# Patient Record
Sex: Female | Born: 1950
Health system: Southern US, Community
[De-identification: ages and names within clinical notes are randomized; demographics above are authoritative.]

## PROBLEM LIST (undated history)

## (undated) DIAGNOSIS — K2100 Gastro-esophageal reflux disease with esophagitis, without bleeding: Secondary | ICD-10-CM

## (undated) DIAGNOSIS — R569 Unspecified convulsions: Secondary | ICD-10-CM

## (undated) DIAGNOSIS — K222 Esophageal obstruction: Secondary | ICD-10-CM

## (undated) DIAGNOSIS — T7840XA Allergy, unspecified, initial encounter: Secondary | ICD-10-CM

## (undated) DIAGNOSIS — K449 Diaphragmatic hernia without obstruction or gangrene: Secondary | ICD-10-CM

## (undated) DIAGNOSIS — C3490 Malignant neoplasm of unspecified part of unspecified bronchus or lung: Secondary | ICD-10-CM

## (undated) DIAGNOSIS — R7989 Other specified abnormal findings of blood chemistry: Secondary | ICD-10-CM

## (undated) DIAGNOSIS — E559 Vitamin D deficiency, unspecified: Secondary | ICD-10-CM

## (undated) DIAGNOSIS — C649 Malignant neoplasm of unspecified kidney, except renal pelvis: Secondary | ICD-10-CM

## (undated) DIAGNOSIS — R51 Headache: Secondary | ICD-10-CM

## (undated) DIAGNOSIS — M545 Low back pain, unspecified: Secondary | ICD-10-CM

## (undated) DIAGNOSIS — C801 Malignant (primary) neoplasm, unspecified: Secondary | ICD-10-CM

## (undated) DIAGNOSIS — G8929 Other chronic pain: Secondary | ICD-10-CM

## (undated) DIAGNOSIS — K219 Gastro-esophageal reflux disease without esophagitis: Secondary | ICD-10-CM

## (undated) DIAGNOSIS — K648 Other hemorrhoids: Secondary | ICD-10-CM

## (undated) DIAGNOSIS — D126 Benign neoplasm of colon, unspecified: Secondary | ICD-10-CM

## (undated) DIAGNOSIS — N63 Unspecified lump in unspecified breast: Secondary | ICD-10-CM

## (undated) DIAGNOSIS — K589 Irritable bowel syndrome without diarrhea: Secondary | ICD-10-CM

## (undated) DIAGNOSIS — R519 Headache, unspecified: Secondary | ICD-10-CM

## (undated) DIAGNOSIS — M1990 Unspecified osteoarthritis, unspecified site: Secondary | ICD-10-CM

## (undated) DIAGNOSIS — R609 Edema, unspecified: Secondary | ICD-10-CM

## (undated) DIAGNOSIS — N959 Unspecified menopausal and perimenopausal disorder: Secondary | ICD-10-CM

## (undated) DIAGNOSIS — E785 Hyperlipidemia, unspecified: Secondary | ICD-10-CM

## (undated) DIAGNOSIS — Z5189 Encounter for other specified aftercare: Secondary | ICD-10-CM

## (undated) DIAGNOSIS — I10 Essential (primary) hypertension: Secondary | ICD-10-CM

## (undated) HISTORY — DX: Gastro-esophageal reflux disease with esophagitis: K21.0

## (undated) HISTORY — DX: Headache: R51

## (undated) HISTORY — PX: BREAST SURGERY: SHX581

## (undated) HISTORY — DX: Allergy, unspecified, initial encounter: T78.40XA

## (undated) HISTORY — DX: Irritable bowel syndrome, unspecified: K58.9

## (undated) HISTORY — PX: KIDNEY SURGERY: SHX687

## (undated) HISTORY — DX: Esophageal obstruction: K22.2

## (undated) HISTORY — DX: Encounter for other specified aftercare: Z51.89

## (undated) HISTORY — DX: Gastro-esophageal reflux disease with esophagitis, without bleeding: K21.00

## (undated) HISTORY — PX: LUNG SURGERY: SHX703

## (undated) HISTORY — DX: Low back pain, unspecified: M54.50

## (undated) HISTORY — DX: Benign neoplasm of colon, unspecified: D12.6

## (undated) HISTORY — DX: Malignant neoplasm of unspecified part of unspecified bronchus or lung: C34.90

## (undated) HISTORY — DX: Unspecified osteoarthritis, unspecified site: M19.90

## (undated) HISTORY — DX: Headache, unspecified: R51.9

## (undated) HISTORY — DX: Essential (primary) hypertension: I10

## (undated) HISTORY — DX: Low back pain: M54.5

## (undated) HISTORY — PX: NASAL SINUS SURGERY: SHX719

## (undated) HISTORY — DX: Other hemorrhoids: K64.8

## (undated) HISTORY — DX: Malignant (primary) neoplasm, unspecified: C80.1

## (undated) HISTORY — DX: Other chronic pain: G89.29

## (undated) HISTORY — DX: Other specified abnormal findings of blood chemistry: R79.89

## (undated) HISTORY — DX: Diaphragmatic hernia without obstruction or gangrene: K44.9

## (undated) HISTORY — DX: Unspecified lump in unspecified breast: N63.0

## (undated) HISTORY — DX: Malignant neoplasm of unspecified kidney, except renal pelvis: C64.9

## (undated) HISTORY — DX: Unspecified menopausal and perimenopausal disorder: N95.9

## (undated) HISTORY — DX: Unspecified convulsions: R56.9

## (undated) HISTORY — DX: Edema, unspecified: R60.9

## (undated) HISTORY — DX: Gastro-esophageal reflux disease without esophagitis: K21.9

## (undated) HISTORY — PX: TOTAL ABDOMINAL HYSTERECTOMY: SHX209

## (undated) HISTORY — PX: ABDOMINAL SURGERY: SHX537

## (undated) HISTORY — DX: Hyperlipidemia, unspecified: E78.5

## (undated) HISTORY — DX: Vitamin D deficiency, unspecified: E55.9

---
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
## 1999-06-21 ENCOUNTER — Emergency Department (HOSPITAL_COMMUNITY): Admission: EM | Admit: 1999-06-21 | Discharge: 1999-06-21 | Payer: Self-pay | Admitting: Emergency Medicine

## 1999-06-21 ENCOUNTER — Encounter: Payer: Self-pay | Admitting: Emergency Medicine

## 2001-11-14 ENCOUNTER — Emergency Department (HOSPITAL_COMMUNITY): Admission: EM | Admit: 2001-11-14 | Discharge: 2001-11-14 | Payer: Self-pay | Admitting: Emergency Medicine

## 2001-11-14 ENCOUNTER — Encounter: Payer: Self-pay | Admitting: Emergency Medicine

## 2002-03-28 ENCOUNTER — Encounter: Payer: Self-pay | Admitting: Emergency Medicine

## 2002-03-28 ENCOUNTER — Emergency Department (HOSPITAL_COMMUNITY): Admission: EM | Admit: 2002-03-28 | Discharge: 2002-03-28 | Payer: Self-pay | Admitting: Emergency Medicine

## 2003-07-12 ENCOUNTER — Encounter: Payer: Self-pay | Admitting: Gastroenterology

## 2003-07-12 ENCOUNTER — Ambulatory Visit (HOSPITAL_COMMUNITY): Admission: RE | Admit: 2003-07-12 | Discharge: 2003-07-12 | Payer: Self-pay | Admitting: Gastroenterology

## 2003-08-17 ENCOUNTER — Encounter: Payer: Self-pay | Admitting: Gastroenterology

## 2004-12-07 ENCOUNTER — Emergency Department (HOSPITAL_COMMUNITY): Admission: EM | Admit: 2004-12-07 | Discharge: 2004-12-07 | Payer: Self-pay | Admitting: Emergency Medicine

## 2005-03-24 ENCOUNTER — Encounter: Admission: RE | Admit: 2005-03-24 | Discharge: 2005-03-24 | Payer: Self-pay | Admitting: Family Medicine

## 2005-04-04 ENCOUNTER — Emergency Department (HOSPITAL_COMMUNITY): Admission: EM | Admit: 2005-04-04 | Discharge: 2005-04-05 | Payer: Self-pay | Admitting: Emergency Medicine

## 2005-09-24 ENCOUNTER — Encounter: Admission: RE | Admit: 2005-09-24 | Discharge: 2005-09-24 | Payer: Self-pay | Admitting: Internal Medicine

## 2005-09-26 ENCOUNTER — Encounter: Admission: RE | Admit: 2005-09-26 | Discharge: 2005-09-26 | Payer: Self-pay | Admitting: Internal Medicine

## 2005-09-30 ENCOUNTER — Other Ambulatory Visit: Admission: RE | Admit: 2005-09-30 | Discharge: 2005-09-30 | Payer: Self-pay | Admitting: Obstetrics and Gynecology

## 2005-10-07 ENCOUNTER — Encounter: Admission: RE | Admit: 2005-10-07 | Discharge: 2005-10-07 | Payer: Self-pay | Admitting: Internal Medicine

## 2005-12-25 ENCOUNTER — Other Ambulatory Visit: Admission: RE | Admit: 2005-12-25 | Discharge: 2005-12-25 | Payer: Self-pay | Admitting: Obstetrics & Gynecology

## 2006-05-04 ENCOUNTER — Ambulatory Visit (HOSPITAL_COMMUNITY): Admission: RE | Admit: 2006-05-04 | Discharge: 2006-05-04 | Payer: Self-pay | Admitting: Specialist

## 2006-12-28 ENCOUNTER — Ambulatory Visit: Payer: Self-pay | Admitting: Gastroenterology

## 2006-12-28 LAB — CONVERTED CEMR LAB
Alkaline Phosphatase: 57 units/L (ref 39–117)
Amylase: 121 units/L (ref 27–131)
BUN: 11 mg/dL (ref 6–23)
Basophils Relative: 0.6 % (ref 0.0–1.0)
Bilirubin, Direct: 0.1 mg/dL (ref 0.0–0.3)
CO2: 27 meq/L (ref 19–32)
Creatinine, Ser: 0.6 mg/dL (ref 0.4–1.2)
GFR calc Af Amer: 133 mL/min
Glucose, Bld: 94 mg/dL (ref 70–99)
HCT: 24.8 % — ABNORMAL LOW (ref 36.0–46.0)
Hemoglobin: 8.6 g/dL — ABNORMAL LOW (ref 12.0–15.0)
IgA: 253 mg/dL (ref 68–378)
Lipase: 25 units/L (ref 11.0–59.0)
Lymphocytes Relative: 18.7 % (ref 12.0–46.0)
MCHC: 34.4 g/dL (ref 30.0–36.0)
Monocytes Absolute: 0.3 10*3/uL (ref 0.2–0.7)
Monocytes Relative: 6.3 % (ref 3.0–11.0)
Neutro Abs: 3.2 10*3/uL (ref 1.4–7.7)
Neutrophils Relative %: 73.5 % (ref 43.0–77.0)
Potassium: 4 meq/L (ref 3.5–5.1)
Sodium: 141 meq/L (ref 135–145)
Total Bilirubin: 0.5 mg/dL (ref 0.3–1.2)
Total Protein: 7.3 g/dL (ref 6.0–8.3)

## 2006-12-30 ENCOUNTER — Ambulatory Visit: Payer: Self-pay | Admitting: Cardiology

## 2007-01-26 ENCOUNTER — Ambulatory Visit: Payer: Self-pay | Admitting: Gastroenterology

## 2007-02-10 ENCOUNTER — Ambulatory Visit: Payer: Self-pay | Admitting: Gastroenterology

## 2007-02-10 LAB — CONVERTED CEMR LAB
Fecal Occult Blood: NEGATIVE
OCCULT 4: NEGATIVE
OCCULT 5: NEGATIVE

## 2007-07-06 ENCOUNTER — Emergency Department (HOSPITAL_COMMUNITY): Admission: EM | Admit: 2007-07-06 | Discharge: 2007-07-06 | Payer: Self-pay | Admitting: Emergency Medicine

## 2008-11-03 ENCOUNTER — Ambulatory Visit: Payer: Self-pay | Admitting: Gastroenterology

## 2008-11-06 ENCOUNTER — Encounter: Payer: Self-pay | Admitting: Gastroenterology

## 2009-02-27 ENCOUNTER — Encounter: Payer: Self-pay | Admitting: Gastroenterology

## 2010-06-19 ENCOUNTER — Encounter (INDEPENDENT_AMBULATORY_CARE_PROVIDER_SITE_OTHER): Payer: Self-pay | Admitting: *Deleted

## 2010-11-19 NOTE — Letter (Signed)
Summary: Colonoscopy Date Change Letter  [facility]  [address] [place], [place] [address]   Phone: [phone]  Fax: [phone]      June 19, 2010 MRN: [id number]   Mary Collier [address] [place], [place]  [address]   Dear [name],   Previously you were recommended to have a repeat colonoscopy around this time. Your chart was recently reviewed by Dr. Claudette Head of [facility]. Follow up colonoscopy is now recommended in October 2014. This revised recommendation is based on current, nationally recognized guidelines for colorectal cancer screening and polyp surveillance. These guidelines are endorsed by the American Cancer Society, [employer] on Colorectal Cancer as well as numerous other major medical organizations.  Please understand that our recommendation assumes that you do not have any new symptoms such as bleeding, a change in bowel habits, anemia, or significant abdominal discomfort. If you do have any concerning GI symptoms or want to discuss the guideline recommendations, please call to arrange an office visit at your earliest convenience. Otherwise we will keep you in our reminder system and contact you 1-2 months prior to the date listed above to schedule your next colonoscopy.  Thank you,  Judie Petit T. Russella Dar, M.D.  [facility] [phone]

## 2011-03-07 NOTE — Op Note (Signed)
NAMEDERRIAN, RODAK                 ACCOUNT NO.:  [id number]   MEDICAL RECORD NO.:  [id number]          PATIENT TYPE:  AMB   LOCATION:  SDC                           FACILITY:  [facility]   PHYSICIAN:  Freddy Finner, M.D.   DATE OF BIRTH:  Jan 08, 1951   DATE OF PROCEDURE:  05/04/2006  DATE OF DISCHARGE:                                 OPERATIVE REPORT   PREOPERATIVE DIAGNOSES:  1.  Intermittent but chronic recurring pelvic pain.  2.  Previous history of total abdominal hysterectomy, bilateral salpingo-      oophorectomy.  3.  Previous history of pelvic endometriosis.   OPERATIVE PROCEDURES:  Laparoscopy, fulguration of endometriotic lesions in  pelvis.   ANESTHESIA:  General endotracheal.   SURGEON:  Dr. Jennette Kettle.   ESTIMATED INTRAOPERATIVE BLOOD LOSS:  Less than 10 cc.   INTRAOPERATIVE COMPLICATIONS:  None.   The patient is a 60 year old with intermittent episode of pelvic and lower  abdominal pain.  Previous evaluation, including pelvic ultrasound, has  revealed no apparent obvious etiology for her symptoms.  Due to the  persistence of her symptoms and the anxiety over them, she is now admitted  for diagnostic laparoscopy.   She was admitted on the morning of surgery.  She was brought to the  operating room, placed under adequate general endotracheal anesthesia,  placed in the dorsal lithotomy position using the Florin stirrup system.  Betadine prep was carried out in the usual fashion.  Bladder was evacuated  using sterile technique.  Sponge forceps were placed into the vagina, with  sponges at the tip, for potential use during the procedure.  Two sterile  drapes were applied.  Two small incisions were made, one at the umbilicus  and one just above the symphysis.  Through the operative incision, an 11-mm  nonbladed disposable trocar was introduced while elevating the anterior  abdominal wall manually.  The rectus fascia revealed adequate placement  without evidence of injury on  entry.  Pneumoperitoneum was allowed to  accumulate with carbon dioxide gas.  A 5-mm trocar was placed through the  lower incision, which was just above the symphysis in the midline, and  through the trocar sleeve a blunt probe was used for retraction and exposure  during the procedure.  Scanning inspection of the upper abdomen revealed  adhesions in the right upper quadrant precluding visualization of the liver.  These were not addressed.  The appendix could not be visualized and was  presumed to be retrocecal.  Examination of pelvic contents revealed several  lesions consistent with recurrent pelvic endometriosis.  The two most  prominent ones were along the scar at the apex of the vagina which could be  identified __________ canal.  There were also lesions at the far extremes of  the bladder fold on either side, and in the cul-de-sac.  All visible  evidence of recurrent or possibly recurrent disease were fulgurated with the  bipolar Kleppinger forceps.  There were some adhesions of the sigmoid colon  to the lateral pelvic sidewall which were completely formed and not leaving  blind loop  to allow any kind of prolapse of bowel.  These were thought not  to be of major significance and were not dissected.  At this point the  procedure was terminated.  The instruments were removed.  The gas was  allowed to escape from the abdomen.  The skin incisions were closed with  interrupted subcuticular sutures of 3-0 Dexon.  Steri-Strips were applied to  the lower incision.  Local anesthetic with 0.25% plain Marcaine  was used in the incision sites for postoperative analgesia.  The patient  tolerated the procedure well, was awakened and taken to recovery in good  condition.  She was given Darvocet to be taken as needed for postoperative  pain.  She is to return to the office in approximately two weeks.      Freddy Finner, M.D.  Electronically Signed     WRN/MEDQ  D:  05/04/2006  T:  05/04/2006   Job:  [id number]

## 2011-03-07 NOTE — Assessment & Plan Note (Signed)
[facility]                         GASTROENTEROLOGY OFFICE NOTE   NAME:Mary Collier, Mary Collier                        MRN:          [id number]  DATE:12/28/2006                            DOB:          [birth year]    DATE OF VISIT:  December 28, 2006   CHIEF COMPLAINT:  This is a 60 year old African-American female with  chest pain, dysphagia, left upper quadrant pain, rectal pain and  alternating bowel habits.   HISTORY OF PRESENT ILLNESS:  [name] is followed by Dr. Jacalyn Lefevre  at [facility].  I previously evaluated her for irritable bowel  syndrome, hemorrhoids, gastroesophageal reflux disease and dysphagia in  2004.  Upper endoscopy was performed in September of 2004 for GERD.  An  empiric dilation was performed which helped relieve her dysphagia at the  time.  She states she remained on Protonix until the prescription ran  out and then she has not taken acid suppressants since that time.  She  underwent colonoscopy in October of 2004 that showed only internal  hemorrhoids and she was treated for irritable bowel syndrome with  Robinul Forte and for internal hemorrhoids.  She states over the past  three to four months she has had difficulty with mid sternal chest pain  that is exacerbated by swallowing.  She has had left upper quadrant and  left lower chest pain that is intermittent and feels like something may  rupture.  She has had ongoing problems with alternating diarrhea and  constipation associated with gas and bloating.  She has noted some mild  rectal pain which has improved with prep H. She has not noted any  melena, hematochezia or change in stool caliber.  She also notes  frequent belching and nausea.  She was treated with Prevacid for several  weeks with no improvement in her symptoms.  There is no family history  of colon cancer, colon polyps or inflammatory bowel disease.   PAST MEDICAL HISTORY:  1. Irritable bowel syndrome.  2. Gastroesophageal reflux disease.  3. Allergic rhinitis.  4. Status post hysterectomy.  5. Status post removal of a cyst or tumor on her right kidney.  6. Status post removal of a cyst or tumor on her right lung.  7. Status post left breast cyst biopsy.  8. Internal hemorrhoids.   CURRENT MEDICATIONS:  1. Calcium daily.  2. Premarin daily.   MEDICATION ALLERGIES:  COMPAZINE, ASPIRIN, TETRACYCLINE, CELEBREX,  ERYTHROMYCIN, EPINEPHRINE AND CODEINE.   SOCIAL HISTORY:  Per the hand-written form.   REVIEW OF SYSTEMS:  Per the hand-written form.   PHYSICAL EXAMINATION:  GENERAL APPEARANCE:  Overweight, in no acute  distress.  VITAL SIGNS:  Height 5 feet, 1 inch, weight 173.2 pounds.  Blood  pressure is 130/84. Pulse 72 and regular.  HEENT:  Anicteric sclerae.  Oropharynx clear.  CHEST:  Clear to auscultation bilaterally.  There is mild chest wall  tenderness in the lower sternal area and lower left chest wall.  CARDIAC:  Regular rate and rhythm without murmurs appreciated.  ABDOMEN:  Soft with mild left upper quadrant tenderness to  deep  palpation.  No rebound or guarding.  No palpable organomegaly, masses or  hernias.  Normal active bowel sounds.  EXTREMITIES:  Without cyanosis, clubbing or edema.  NEUROLOGICAL:  Alert and oriented x3.  Grossly nonfocal.   ASSESSMENT AND PLAN:  1. Presumed gastroesophageal reflux disease with dysphagia.  Rule out      esophagitis and strictures.  Begin AcipHex 20 mg q.a.m. along with      standard anti-reflux measures.  Risks, benefits and alternatives to      upper endoscopy and possible biopsy were discussed with the patient      and she consents to proceed.  This will be scheduled electively.  2. Presumed irritable bowel syndrome.  Resume Robinul Forte one b.i.d.      and maintain a high fiber diet with adequate fluid intake.  Will      obtain a CBC, BMET, hepatic function panel, amylase, lipase, TSH      and tissue transglutaminase.  3.  Mild rectal pain with a history of internal hemorrhoids.  Symptoms      improved with over the counter suppositories.  Begin Anusol HC      suppositories, 1 q.h.s. for two weeks along with standard rectal      care instructions.     Mary Collier. Mary Dar, MD, [facility]  Electronically Signed    MTS/MedQ  DD: 12/28/2006  DT: 12/28/2006  Job #: [id number]   cc:   Bertram Millard. Hyacinth Meeker, M.D.

## 2011-04-07 ENCOUNTER — Ambulatory Visit
Admission: RE | Admit: 2011-04-07 | Discharge: 2011-04-07 | Disposition: A | Discharge status: Home / Self Care | Payer: BC Managed Care – PPO | Source: Ambulatory Visit | Attending: Internal Medicine | Admitting: Internal Medicine

## 2011-04-07 ENCOUNTER — Other Ambulatory Visit: Payer: Self-pay | Admitting: Internal Medicine

## 2011-04-07 DIAGNOSIS — R0989 Other specified symptoms and signs involving the circulatory and respiratory systems: Secondary | ICD-10-CM

## 2011-04-11 ENCOUNTER — Other Ambulatory Visit: Payer: Self-pay | Admitting: Internal Medicine

## 2011-04-11 ENCOUNTER — Ambulatory Visit
Admission: RE | Admit: 2011-04-11 | Discharge: 2011-04-11 | Disposition: A | Discharge status: Home / Self Care | Payer: BC Managed Care – PPO | Source: Ambulatory Visit | Attending: Internal Medicine | Admitting: Internal Medicine

## 2011-04-11 DIAGNOSIS — R9389 Abnormal findings on diagnostic imaging of other specified body structures: Secondary | ICD-10-CM

## 2011-04-11 MED ORDER — IOHEXOL 300 MG/ML  SOLN
75.0000 mL | Freq: Once | INTRAMUSCULAR | Status: AC | PRN
  Administered 2011-04-11: 75 mL via INTRAVENOUS

## 2011-04-15 ENCOUNTER — Telehealth: Payer: Self-pay | Admitting: *Deleted

## 2011-04-15 NOTE — Telephone Encounter (Signed)
Spoke with Synetta Fail. She states that pt has already been scheduled to see [facility] tomorrow at 9:15 b/c someone cancelled. Nothing further needed.

## 2011-04-16 ENCOUNTER — Encounter: Payer: Self-pay | Admitting: Pulmonary Disease

## 2011-04-16 ENCOUNTER — Ambulatory Visit (INDEPENDENT_AMBULATORY_CARE_PROVIDER_SITE_OTHER): Payer: BC Managed Care – PPO | Admitting: Pulmonary Disease

## 2011-04-16 VITALS — BP 150/90 | HR 80 | Temp 98.1°F | Ht 61.0 in | Wt 175.0 lb

## 2011-04-16 DIAGNOSIS — R911 Solitary pulmonary nodule: Secondary | ICD-10-CM | POA: Insufficient documentation

## 2011-04-16 DIAGNOSIS — J984 Other disorders of lung: Secondary | ICD-10-CM

## 2011-04-16 NOTE — Patient Instructions (Addendum)
Make sure that you get your health maintenance up to date (mammogram, pap/pelvic, colonoscopy) Will do a followup ct chest in one year.  Please call to get this set up, and will put a reminder in our system as well.  Let us know if something changes in your health status.

## 2011-04-16 NOTE — Progress Notes (Signed)
  Subjective:    Patient ID: Mary Collier, female    DOB: [birth year], 60 y.o.   MRN: [id number]  HPI The pt is a 60y/o female who I have been asked to see for an abnormal ct chest.  The pt was recently found to have a 4mm nodular density in RLL on ct chest, but no other significant findings.  She apparently has a h/o some type of "cancer" at age 67 which required surgery on her chest, abdomen, and kidney.  She is unclear about this, and obviously no records are available.  The pt has never smoked, and she has minimal cough without mucus.  The pt's health maintenance is not up to date, with her last mammogram approx 5 yrs ago.  It has been a lot of years since her pap/pelvic.  She denies any anorexia, weight loss, TB exposure, or living in areas endemic for fungi.  She has had a negative PPD in the past.     Review of Systems  Constitutional: Positive for unexpected weight change. Negative for fever.  HENT: Positive for sore throat, sneezing and trouble swallowing. Negative for ear pain, nosebleeds, congestion, rhinorrhea, dental problem, postnasal drip and sinus pressure.   Eyes: Negative for redness and itching.  Respiratory: Positive for cough and shortness of breath. Negative for chest tightness and wheezing.   Cardiovascular: Negative for palpitations and leg swelling.  Gastrointestinal: Negative for nausea and vomiting.  Genitourinary: Negative for dysuria.  Musculoskeletal: Positive for joint swelling.  Skin: Positive for rash.  Neurological: Negative for headaches.  Hematological: Does not bruise/bleed easily.  Psychiatric/Behavioral: Negative for dysphoric mood. The patient is not nervous/anxious.        Objective:   Physical Exam Constitutional:  Well developed, no acute distress  HENT:  Nares patent without discharge, turbinate hypertrophy  Oropharynx without exudate, palate and uvula are normal  Eyes:  Perrla, eomi, no scleral icterus  Neck:  No JVD, no  TMG  Cardiovascular:  Normal rate, regular rhythm, no rubs or gallops.  2/6 sem        Intact distal pulses  Pulmonary :  Normal breath sounds, no stridor or respiratory distress   No rales, rhonchi, or wheezing  Abdominal:  Soft, nondistended, bowel sounds present.  No tenderness noted.   Musculoskeletal:  No lower extremity edema noted.  Lymph Nodes:  No cervical lymphadenopathy noted  Skin:  No cyanosis noted  Neurologic:  Alert, appropriate, moves all 4 extremities without obvious deficit.         Assessment & Plan:

## 2011-04-16 NOTE — Assessment & Plan Note (Signed)
The pt has been found to have a 4mm nodule in RLL.  She is a nonsmoker, but does have a ??h/o cancer as a teenager?  The nodule is too small to biopsy or to clarify with PET.  It is most likely a benign entity.  By Fleischner criteria she needs a f/u ct chest in one year, and I would agree.  The pt had no specific questions, ad is agreeable.  I did recommend that she get her health maintenance up to date, and to call us if there is something abnormal.

## 2011-05-01 ENCOUNTER — Other Ambulatory Visit: Payer: Self-pay | Admitting: Internal Medicine

## 2011-05-01 DIAGNOSIS — G8929 Other chronic pain: Secondary | ICD-10-CM

## 2011-05-05 ENCOUNTER — Ambulatory Visit
Admission: RE | Admit: 2011-05-05 | Discharge: 2011-05-05 | Disposition: A | Discharge status: Home / Self Care | Payer: BC Managed Care – PPO | Source: Ambulatory Visit | Attending: Internal Medicine | Admitting: Internal Medicine

## 2011-05-05 DIAGNOSIS — G8929 Other chronic pain: Secondary | ICD-10-CM

## 2011-05-05 MED ORDER — IOHEXOL 300 MG/ML  SOLN
100.0000 mL | Freq: Once | INTRAMUSCULAR | Status: AC | PRN
  Administered 2011-05-05: 100 mL via INTRAVENOUS

## 2011-05-19 ENCOUNTER — Encounter: Payer: Self-pay | Admitting: Gastroenterology

## 2011-06-18 ENCOUNTER — Ambulatory Visit: Payer: BC Managed Care – PPO | Admitting: Gastroenterology

## 2011-06-18 ENCOUNTER — Ambulatory Visit (INDEPENDENT_AMBULATORY_CARE_PROVIDER_SITE_OTHER): Payer: BC Managed Care – PPO | Admitting: Gastroenterology

## 2011-06-18 ENCOUNTER — Encounter: Payer: Self-pay | Admitting: Gastroenterology

## 2011-06-18 VITALS — BP 136/80 | HR 60 | Ht 61.0 in | Wt 176.0 lb

## 2011-06-18 DIAGNOSIS — K219 Gastro-esophageal reflux disease without esophagitis: Secondary | ICD-10-CM

## 2011-06-18 DIAGNOSIS — K5900 Constipation, unspecified: Secondary | ICD-10-CM

## 2011-06-18 DIAGNOSIS — K589 Irritable bowel syndrome without diarrhea: Secondary | ICD-10-CM

## 2011-06-18 MED ORDER — GLYCOPYRROLATE 2 MG PO TABS: 2.0000 mg | ORAL_TABLET | Freq: Two times a day (BID) | ORAL | Status: DC

## 2011-06-18 MED ORDER — OMEPRAZOLE 40 MG PO CPDR: 40.0000 mg | DELAYED_RELEASE_CAPSULE | Freq: Every day | ORAL | Status: DC

## 2011-06-18 NOTE — Patient Instructions (Addendum)
Your prescriptions have been sent to the pharmacy.  Patient advised to avoid spicy, acidic, citrus, chocolate, mints, fruit and fruit juices.  Limit the intake of caffeine, alcohol and Soda.  Don't exercise too soon after eating.  Don't lie down within 3-4 hours of eating.  Elevate the head of your bed. Start Miralax 17 grams in 8 oz of water 1-3 x daily titrating for a bowel movement for constipation.  High fiber diet given. cc: Doran Durand, MD

## 2011-06-18 NOTE — Progress Notes (Signed)
History of Present Illness: This is a 60 year old female with chronic constipation, left-sided abdominal pain and difficulty swallowing solid foods. She has a history of irritable bowel syndrome and has had constipation for many years and occasional diarrhea phases. She has been prescribed Robinul Forte in the past. She also has a history of GERD and dysphagia she has had inferior esophageal dilations 2 times in the past. She is not proton inhibitor as has been recommended to her previously. She's not taking any medications for interval bowel syndrome or constipation. Denies weight loss, diarrhea, change in stool caliber, melena, hematochezia, nausea, vomiting, chest pain.  Past Medical History  Diagnosis Date  . Cancer of kidney     at age 30.  Right kidney.  . Lung cancer     at age 28  Right lung.  . Cancer     at age 42.  abdominal  . Allergic rhinitis   . Chronic headache   . Hiatal hernia   . Internal hemorrhoid   . IBS (irritable bowel syndrome)   . GERD (gastroesophageal reflux disease)   . Peptic stricture of esophagus   . Asthma   . Reflux esophagitis   . Glaucoma   . Hypertension    Past Surgical History  Procedure Date  . Lung surgery at age 22    Right  . Nasal sinus surgery 1980s  . Total abdominal hysterectomy 2000s  . Breast surgery 1970s and 1980s    Bilateral cysts removed  . Kidney surgery at age 18    Right  . Abdominal surgery at age 23     reports that she has never smoked. She has never used smokeless tobacco. She reports that she does not drink alcohol or use illicit drugs. family history includes Allergies in her sister; Asthma in her sister; Bone cancer in her father; Breast cancer in her mother; Heart disease in her other; and Lung cancer in her father. Allergies  Allergen Reactions  . Aspirin   . Celecoxib     Celebrex  . Codeine   . Epinephrine   . Erythromycin   . Prednisone   . Prochlorperazine Edisylate     Compazine  . Sulfa Antibiotics    . Tetracycline   . Vioxx (Rofecoxib)    Outpatient Encounter Prescriptions as of 06/18/2011  Medication Sig Dispense Refill  . acetaminophen (TYLENOL) 325 MG tablet Take 325 mg by mouth as needed.        Marland Kitchen amoxicillin-clavulanate (AUGMENTIN) 875-125 MG per tablet Take one tablet by mouth twice daily for ten days       . bimatoprost (LUMIGAN) 0.03 % ophthalmic solution Place 1 drop into both eyes at bedtime.        . metoprolol succinate (TOPROL-XL) 25 MG 24 hr tablet Take 1/2 tablet by mouth once daily       . glycopyrrolate (ROBINUL) 2 MG tablet Take 1 tablet (2 mg total) by mouth 2 (two) times daily.  60 tablet  11  . omeprazole (PRILOSEC) 40 MG capsule Take 1 capsule (40 mg total) by mouth daily.  30 capsule  11   Review of Systems: Pertinent positive and negative review of systems were noted in the above HPI section. All other review of systems were otherwise negative.  Physical Exam: General: Well developed , well nourished, no acute distress Head: Normocephalic and atraumatic Eyes:  sclerae anicteric, EOMI Ears: Normal auditory acuity Mouth: No deformity or lesions Neck: Supple, no masses or thyromegaly Lungs: Clear throughout  to auscultation Heart: Regular rate and rhythm; no murmurs, rubs or bruits Abdomen: Soft, non tender and non distended. No masses, hepatosplenomegaly or hernias noted. Normal Bowel sounds Musculoskeletal: Symmetrical with no gross deformities  Skin: No lesions on visible extremities Pulses:  Normal pulses noted Extremities: No clubbing, cyanosis, edema or deformities noted Neurological: Alert oriented x 4, grossly nonfocal Cervical Nodes:  No significant cervical adenopathy Inguinal Nodes: No significant inguinal adenopathy Psychological:  Alert and cooperative. Normal mood and affect  Assessment and Recommendations:  1. Recurrent dysphagia. History of GERD with esophagitis with no stricture noted on 2 prior endoscopies. Resume a daily PPI and standard  antireflux measures, both for long-term usage. If her dysphagia does not resolve schedule an endoscopy with dilation.  2. Chronic constipation with left-sided abdominal pain. Long history of irritable bowel syndrome and constipation. Increase dietary water and fiber intake. Begin glycopyrrolate twice daily as needed for pain and MiraLax 1-3 times daily titrated for adequate bowel movements. If symptoms do not improve consider imaging studies, bloodwork and colonoscopy.

## 2011-07-21 ENCOUNTER — Encounter: Payer: BC Managed Care – PPO | Admitting: Gastroenterology

## 2011-07-21 ENCOUNTER — Encounter: Payer: Self-pay | Admitting: Gastroenterology

## 2011-07-22 NOTE — Progress Notes (Signed)
This encounter was created in error - please disregard. This encounter was created in error - please disregard. This encounter was created in error - please disregard. 

## 2011-07-31 LAB — DIFFERENTIAL
Basophils Absolute: 0
Eosinophils Absolute: 0
Eosinophils Relative: 1
Lymphocytes Relative: 28
Lymphs Abs: 1.6
Neutrophils Relative %: 62

## 2011-07-31 LAB — CBC
HCT: 39.3
Hemoglobin: 13.3
MCHC: 33.8
MCV: 87.6
RBC: 4.49
WBC: 5.8

## 2011-07-31 LAB — COMPREHENSIVE METABOLIC PANEL
CO2: 28
Calcium: 8.8
Chloride: 104
Creatinine, Ser: 0.77
GFR calc non Af Amer: >60
Glucose, Bld: 94
Total Bilirubin: 1.1

## 2011-07-31 LAB — URINALYSIS, ROUTINE W REFLEX MICROSCOPIC
Hgb urine dipstick: NEGATIVE
Nitrite: NEGATIVE
Protein, ur: NEGATIVE
Specific Gravity, Urine: 1.006
Urobilinogen, UA: 0.2

## 2011-07-31 LAB — LIPASE, BLOOD: Lipase: 18

## 2011-08-25 ENCOUNTER — Other Ambulatory Visit: Payer: Self-pay | Admitting: Internal Medicine

## 2011-08-25 ENCOUNTER — Ambulatory Visit
Admission: RE | Admit: 2011-08-25 | Discharge: 2011-08-25 | Disposition: A | Discharge status: Home / Self Care | Payer: BC Managed Care – PPO | Source: Ambulatory Visit | Attending: Internal Medicine | Admitting: Internal Medicine

## 2011-08-25 DIAGNOSIS — R609 Edema, unspecified: Secondary | ICD-10-CM

## 2012-02-17 ENCOUNTER — Other Ambulatory Visit: Payer: Self-pay

## 2012-02-17 MED ORDER — GLYCOPYRROLATE 2 MG PO TABS: 2.0000 mg | ORAL_TABLET | Freq: Two times a day (BID) | ORAL | Status: DC

## 2012-02-18 ENCOUNTER — Other Ambulatory Visit: Payer: Self-pay

## 2012-02-18 MED ORDER — OMEPRAZOLE 40 MG PO CPDR: 40.0000 mg | DELAYED_RELEASE_CAPSULE | Freq: Every day | ORAL | Status: DC

## 2012-04-15 ENCOUNTER — Telehealth: Payer: Self-pay | Admitting: Pulmonary Disease

## 2012-04-15 DIAGNOSIS — R911 Solitary pulmonary nodule: Secondary | ICD-10-CM

## 2012-04-15 NOTE — Telephone Encounter (Signed)
Pt is scheduled to see KC on 04/16/12 @ 3:45. Last OV note states pt needs follow-up chest ct prior to ov for pulmonary nodule. I called Rose in [place] and she can do ct at 3pm before appt with [facility]. Will place order for this and let PCC's know incase this needs to be pre-certed. LMOMTCB for pt to make sure she can go for CT.

## 2012-04-15 NOTE — Telephone Encounter (Signed)
LMOMTCB x 1 

## 2012-04-16 ENCOUNTER — Ambulatory Visit: Payer: BC Managed Care – PPO | Admitting: Pulmonary Disease

## 2012-04-16 ENCOUNTER — Ambulatory Visit (INDEPENDENT_AMBULATORY_CARE_PROVIDER_SITE_OTHER)
Admission: RE | Admit: 2012-04-16 | Discharge: 2012-04-16 | Disposition: A | Discharge status: Home / Self Care | Payer: BC Managed Care – PPO | Source: Ambulatory Visit | Attending: Pulmonary Disease | Admitting: Pulmonary Disease

## 2012-04-16 DIAGNOSIS — R911 Solitary pulmonary nodule: Secondary | ICD-10-CM

## 2012-04-16 NOTE — Telephone Encounter (Signed)
Per KC, pt does not need OV with him today. She needs the chest ct and we will contact her regarding the results. Pt made aware. KC says ct can be done w/o contrast and Rose at [address] made aware of this. Today's OV has been cancelled.

## 2012-04-23 ENCOUNTER — Other Ambulatory Visit: Payer: Self-pay | Admitting: Pulmonary Disease

## 2012-04-23 DIAGNOSIS — R911 Solitary pulmonary nodule: Secondary | ICD-10-CM

## 2012-05-24 ENCOUNTER — Other Ambulatory Visit: Payer: Self-pay | Admitting: Gastroenterology

## 2012-05-24 NOTE — Telephone Encounter (Signed)
NEEDS OFFICE VISIT FOR ANY FURTHER REFILLS! 

## 2012-06-07 ENCOUNTER — Other Ambulatory Visit: Payer: Self-pay | Admitting: Obstetrics & Gynecology

## 2012-06-07 DIAGNOSIS — R928 Other abnormal and inconclusive findings on diagnostic imaging of breast: Secondary | ICD-10-CM

## 2012-06-11 ENCOUNTER — Other Ambulatory Visit: Payer: Self-pay | Admitting: Obstetrics & Gynecology

## 2012-06-11 ENCOUNTER — Ambulatory Visit
Admission: RE | Admit: 2012-06-11 | Discharge: 2012-06-11 | Disposition: A | Discharge status: Home / Self Care | Payer: BC Managed Care – PPO | Source: Ambulatory Visit | Attending: Obstetrics & Gynecology | Admitting: Obstetrics & Gynecology

## 2012-06-11 DIAGNOSIS — R921 Mammographic calcification found on diagnostic imaging of breast: Secondary | ICD-10-CM

## 2012-06-11 DIAGNOSIS — R928 Other abnormal and inconclusive findings on diagnostic imaging of breast: Secondary | ICD-10-CM

## 2012-06-17 ENCOUNTER — Ambulatory Visit
Admission: RE | Admit: 2012-06-17 | Discharge: 2012-06-17 | Disposition: A | Discharge status: Home / Self Care | Payer: BC Managed Care – PPO | Source: Ambulatory Visit | Attending: Obstetrics & Gynecology | Admitting: Obstetrics & Gynecology

## 2012-06-17 DIAGNOSIS — R921 Mammographic calcification found on diagnostic imaging of breast: Secondary | ICD-10-CM

## 2012-06-18 ENCOUNTER — Ambulatory Visit
Admission: RE | Admit: 2012-06-18 | Discharge: 2012-06-18 | Disposition: A | Discharge status: Home / Self Care | Payer: BC Managed Care – PPO | Source: Ambulatory Visit | Attending: Obstetrics & Gynecology | Admitting: Obstetrics & Gynecology

## 2012-06-18 ENCOUNTER — Other Ambulatory Visit: Payer: Self-pay | Admitting: Obstetrics & Gynecology

## 2012-06-18 DIAGNOSIS — N63 Unspecified lump in unspecified breast: Secondary | ICD-10-CM

## 2012-08-27 ENCOUNTER — Telehealth: Payer: Self-pay | Admitting: Gastroenterology

## 2012-08-27 NOTE — Telephone Encounter (Signed)
Spoke with [name] and told her that is has been over a year since the patient has been seen and needs a office visit for any further refills. She needs to call the office. Mary Collier verbalized understanding.

## 2013-03-17 ENCOUNTER — Other Ambulatory Visit: Payer: Self-pay

## 2013-03-19 ENCOUNTER — Other Ambulatory Visit: Payer: Self-pay | Admitting: Internal Medicine

## 2013-03-22 ENCOUNTER — Ambulatory Visit (INDEPENDENT_AMBULATORY_CARE_PROVIDER_SITE_OTHER): Payer: BC Managed Care – PPO | Admitting: Pulmonary Disease

## 2013-03-22 ENCOUNTER — Other Ambulatory Visit: Payer: Self-pay | Admitting: Internal Medicine

## 2013-03-22 ENCOUNTER — Encounter: Payer: Self-pay | Admitting: Pulmonary Disease

## 2013-03-22 VITALS — BP 140/82 | HR 71 | Temp 98.0°F | Ht 62.0 in | Wt 179.4 lb

## 2013-03-22 DIAGNOSIS — R911 Solitary pulmonary nodule: Secondary | ICD-10-CM

## 2013-03-22 NOTE — Progress Notes (Signed)
  Subjective:    Patient ID: Mary Collier, female    DOB: [birth year], 62 y.o.   MRN: [id number]  HPI The patient comes in today for followup of her pulmonary nodule.  She is due for her last one-year followup, and has been doing well overall.  She has not had any significant weight loss or anorexia.  She has not been diagnosed with cancer in the interim since the last visit.   Review of Systems  Constitutional: Negative for fever and unexpected weight change.  HENT: Negative for ear pain, nosebleeds, congestion, sore throat, rhinorrhea, sneezing, trouble swallowing, dental problem, postnasal drip and sinus pressure.   Eyes: Negative for redness and itching.  Respiratory: Negative for cough, chest tightness, shortness of breath and wheezing.   Cardiovascular: Negative for palpitations and leg swelling.  Gastrointestinal: Negative for nausea and vomiting.  Genitourinary: Negative for dysuria.  Musculoskeletal: Negative for joint swelling.  Skin: Negative for rash.  Neurological: Negative for headaches.  Hematological: Does not bruise/bleed easily.  Psychiatric/Behavioral: Negative for dysphoric mood. The patient is not nervous/anxious.        Objective:   Physical Exam Obese female in no acute distress Nose without purulence or discharge noted Neck without lymphadenopathy or thyromegaly Lower extremities with mild edema, no cyanosis Alert and oriented, moves all 4 extremities.       Assessment & Plan:

## 2013-03-22 NOTE — Assessment & Plan Note (Signed)
The patient has a history of a pulmonary nodule, and some type of "cancer history" as a teenager.  She will need to have one more CT followup, and it doesn't change, no further workup is needed.

## 2013-03-22 NOTE — Patient Instructions (Addendum)
Will set up for your one year ct chest.  Will call you with results.  If stable, no further w/u needed.

## 2013-03-25 ENCOUNTER — Ambulatory Visit (INDEPENDENT_AMBULATORY_CARE_PROVIDER_SITE_OTHER)
Admission: RE | Admit: 2013-03-25 | Discharge: 2013-03-25 | Disposition: A | Discharge status: Home / Self Care | Payer: BC Managed Care – PPO | Source: Ambulatory Visit | Attending: Pulmonary Disease | Admitting: Pulmonary Disease

## 2013-03-25 DIAGNOSIS — R911 Solitary pulmonary nodule: Secondary | ICD-10-CM

## 2013-03-28 ENCOUNTER — Encounter: Payer: Self-pay | Admitting: *Deleted

## 2013-03-29 ENCOUNTER — Encounter: Payer: Self-pay | Admitting: Internal Medicine

## 2013-03-29 ENCOUNTER — Ambulatory Visit (INDEPENDENT_AMBULATORY_CARE_PROVIDER_SITE_OTHER): Payer: BC Managed Care – PPO | Admitting: Internal Medicine

## 2013-03-29 VITALS — BP 140/86 | HR 65 | Temp 97.8°F | Resp 14 | Ht 62.0 in | Wt 176.2 lb

## 2013-03-29 DIAGNOSIS — E559 Vitamin D deficiency, unspecified: Secondary | ICD-10-CM

## 2013-03-29 DIAGNOSIS — K5900 Constipation, unspecified: Secondary | ICD-10-CM

## 2013-03-29 DIAGNOSIS — R739 Hyperglycemia, unspecified: Secondary | ICD-10-CM

## 2013-03-29 DIAGNOSIS — R5381 Other malaise: Secondary | ICD-10-CM

## 2013-03-29 DIAGNOSIS — R5383 Other fatigue: Secondary | ICD-10-CM

## 2013-03-29 DIAGNOSIS — Z0000 Encounter for general adult medical examination without abnormal findings: Secondary | ICD-10-CM

## 2013-03-29 DIAGNOSIS — I10 Essential (primary) hypertension: Secondary | ICD-10-CM

## 2013-03-29 DIAGNOSIS — R7309 Other abnormal glucose: Secondary | ICD-10-CM

## 2013-03-29 DIAGNOSIS — Z1211 Encounter for screening for malignant neoplasm of colon: Secondary | ICD-10-CM

## 2013-03-29 DIAGNOSIS — E785 Hyperlipidemia, unspecified: Secondary | ICD-10-CM

## 2013-03-29 MED ORDER — ATORVASTATIN CALCIUM 10 MG PO TABS: 10.0000 mg | ORAL_TABLET | Freq: Every day | ORAL | Status: DC

## 2013-03-29 MED ORDER — METOPROLOL SUCCINATE ER 25 MG PO TB24: 25.0000 mg | ORAL_TABLET | Freq: Every day | ORAL | Status: DC

## 2013-03-29 MED ORDER — PREGABALIN 75 MG PO CAPS: 75.0000 mg | ORAL_CAPSULE | Freq: Two times a day (BID) | ORAL | Status: DC

## 2013-03-29 NOTE — Progress Notes (Signed)
Subjective:    Patient ID: Mary Collier, female    DOB: [birth year], 62 y.o.   MRN: [id number]  HPI  62 y/o female here for her annual visit. She complaints of generalized bodyache for almost a year now and it has been worsening.  Her hips, legs, arms, back, face and neck bother her. She has constant soreness. Her energy level has been low. Mood is ok. No acute behavioral changes reported. No suicidal ideations She just stays tired Her joint stiffness and pain in her PIP and MCP are bothering her again with the joints locking. She was started on remicaid with diagnosis of RA by Dr Dierdre Forth as per patient. This was started several months back and then she was lost to follow up. She has missed appointments in the office as well. She mentions there were deaths in her family and she was under a lot of stress and thus unable to keep her appointments No recent blood work She has been having rectal pain for 6 months, bothers her at night. It is also painful with defecation. She has been constipated. Her bowel movement is 1-2 times a week and is hard and painful. Earlier she used to have it 3-4 times a week. She drinks a lot of fluid and senna ocassionally. Senna seems to help Denies any melena or hematochezia colonscopy done 2002 which was normal as per patient Last mammogram august 2013 normal Last pap smear in aug 2013 with her ob/gyn normal Tetanus vaccine 2010 Does not take flu vaccine due to allergy to eggs  Review of Systems  Constitutional: Positive for fatigue. Negative for fever, chills and appetite change.  HENT: Positive for neck pain. Negative for hearing loss, congestion, mouth sores, dental problem and sinus pressure.   Eyes: Negative for visual disturbance.       Wears corrective lenses. Last saw eye doctor in jan 2014  Respiratory: Negative for apnea, cough, choking, chest tightness, shortness of breath and wheezing.   Cardiovascular: Negative for chest pain, palpitations and leg  swelling.  Gastrointestinal: Positive for constipation. Negative for nausea, vomiting, abdominal pain and blood in stool.  Endocrine: Positive for cold intolerance. Negative for polydipsia, polyphagia and polyuria.  Genitourinary: Positive for frequency. Negative for dysuria, flank pain, vaginal bleeding, vaginal discharge, difficulty urinating, vaginal pain and pelvic pain.  Musculoskeletal: Positive for back pain and arthralgias. Negative for joint swelling and gait problem.  Skin: Positive for rash. Negative for wound.  Neurological: Negative for dizziness, tremors, seizures, light-headedness, numbness and headaches.  Hematological: Negative for adenopathy.  Psychiatric/Behavioral: Positive for sleep disturbance. Negative for suicidal ideas, hallucinations, behavioral problems and agitation.    Allergies  Allergen Reactions  . Influenza Vaccines     D/t being allergic to eggs  . Aspirin   . Celecoxib     Celebrex  . Codeine   . Eggs Or Egg-Derived Products   . Epinephrine   . Erythromycin   . Prednisone   . Prochlorperazine Edisylate     Compazine  . Sulfa Antibiotics   . Tetracycline   . Vioxx (Rofecoxib)    Past Medical History  Diagnosis Date  . Cancer of kidney     at age 22.  Right kidney.  . Lung cancer     at age 76  Right lung.  . Cancer     at age 6.  abdominal  . Allergic rhinitis   . Chronic headache   . Hiatal hernia   . Internal hemorrhoid   .  IBS (irritable bowel syndrome)   . GERD (gastroesophageal reflux disease)   . Peptic stricture of esophagus   . Asthma   . Reflux esophagitis   . Glaucoma   . Hypertension   . Unspecified vitamin D deficiency   . Other and unspecified hyperlipidemia   . Edema   . Other abnormal blood chemistry   . Lump or mass in breast   . Unspecified menopausal and postmenopausal disorder   . Lumbago   . Other convulsions        Objective:   Physical Exam  Constitutional: She is oriented to person, place, and time.  She appears well-developed and well-nourished. No distress.  HENT:  Head: Normocephalic and atraumatic.  Right Ear: External ear normal.  Left Ear: External ear normal.  Nose: Nose normal.  Mouth/Throat: Oropharynx is clear and moist. No oropharyngeal exudate.  Eyes: Conjunctivae and EOM are normal. Pupils are equal, round, and reactive to light.  Neck: Normal range of motion. Neck supple. No JVD present. No tracheal deviation present. No thyromegaly present.  Cardiovascular: Normal rate, regular rhythm, normal heart sounds and intact distal pulses.   Pulmonary/Chest: Effort normal and breath sounds normal. No respiratory distress. She has no wheezes. She has no rales. She exhibits no tenderness.  Abdominal: Soft. Bowel sounds are normal. She exhibits no distension and no mass. There is no rebound.  Musculoskeletal: She exhibits tenderness. She exhibits no edema.  Multiple point tenderness on exam on face, neck, shoulder, arms, back, hip and legs  Lymphadenopathy:    She has no cervical adenopathy.  Neurological: She is alert and oriented to person, place, and time. She has normal reflexes.  Skin: Skin is warm and dry. No rash noted. She is not diaphoretic. No erythema.  Psychiatric:  Flat affect    BP 140/86  Pulse 65  Temp(Src) 97.8 F (36.6 C) (Oral)  Resp 14  Ht 5\' 2"  (1.575 m)  Wt 176 lb 3.2 oz (79.924 kg)  BMI 32.22 kg/m2     Assessment & Plan:    Fatigue- with generalized aches and multiple tender point on exam, concerns for this being fibromyalgia and will start her on lyrica 75 mg bid and reassess.   Vit d def- to take ca-vit d supplement  constipation- will have her on senna 1 tab daily for now and if not helpful, to take 2 tab daily and reassess. Encouraged fiber intake and prune juice  RA- to reschedule appointment with Dr Dierdre Forth  gerd- symptoms controlled. Continue prilosec  Hyperlipidemia- continue lipitor for now  htn- bp well controlled. Continue  metoprolol current dose  Rectal pain- will provide GI referral for screening colonoscopy as pt is due for one  gen exam- gi referral for screening colonoscopy. uptodate with mammogram and pap smear and immunization. Reviewed meds. Encouraged exercise and explained that being active helps relief chronic pain. Routine labs ordered

## 2013-03-29 NOTE — Patient Instructions (Addendum)
Take senna tablet once a day for a week and if this is not helping with your bowel movement, take 1 tablet twice a day.   Exercise every day for half an hour - brisk walking will be a good exercise to start with

## 2013-03-30 ENCOUNTER — Other Ambulatory Visit: Payer: Self-pay | Admitting: *Deleted

## 2013-03-30 DIAGNOSIS — K589 Irritable bowel syndrome without diarrhea: Secondary | ICD-10-CM

## 2013-03-30 DIAGNOSIS — R5381 Other malaise: Secondary | ICD-10-CM | POA: Insufficient documentation

## 2013-03-30 DIAGNOSIS — Z0000 Encounter for general adult medical examination without abnormal findings: Secondary | ICD-10-CM | POA: Insufficient documentation

## 2013-03-30 DIAGNOSIS — E559 Vitamin D deficiency, unspecified: Secondary | ICD-10-CM | POA: Insufficient documentation

## 2013-03-30 DIAGNOSIS — E119 Type 2 diabetes mellitus without complications: Secondary | ICD-10-CM | POA: Insufficient documentation

## 2013-03-30 DIAGNOSIS — Z1211 Encounter for screening for malignant neoplasm of colon: Secondary | ICD-10-CM | POA: Insufficient documentation

## 2013-03-30 DIAGNOSIS — R7989 Other specified abnormal findings of blood chemistry: Secondary | ICD-10-CM

## 2013-03-30 DIAGNOSIS — E785 Hyperlipidemia, unspecified: Secondary | ICD-10-CM | POA: Insufficient documentation

## 2013-03-30 DIAGNOSIS — R609 Edema, unspecified: Secondary | ICD-10-CM

## 2013-03-30 DIAGNOSIS — R739 Hyperglycemia, unspecified: Secondary | ICD-10-CM | POA: Insufficient documentation

## 2013-03-30 DIAGNOSIS — K5900 Constipation, unspecified: Secondary | ICD-10-CM | POA: Insufficient documentation

## 2013-03-30 DIAGNOSIS — E1169 Type 2 diabetes mellitus with other specified complication: Secondary | ICD-10-CM | POA: Insufficient documentation

## 2013-03-30 DIAGNOSIS — I10 Essential (primary) hypertension: Secondary | ICD-10-CM | POA: Insufficient documentation

## 2013-03-31 ENCOUNTER — Other Ambulatory Visit: Payer: BC Managed Care – PPO

## 2013-03-31 DIAGNOSIS — K589 Irritable bowel syndrome without diarrhea: Secondary | ICD-10-CM

## 2013-03-31 DIAGNOSIS — R7989 Other specified abnormal findings of blood chemistry: Secondary | ICD-10-CM

## 2013-03-31 DIAGNOSIS — R609 Edema, unspecified: Secondary | ICD-10-CM

## 2013-03-31 LAB — HEMOGLOBIN A1C

## 2013-03-31 LAB — COMPREHENSIVE METABOLIC PANEL
ALT: 17 IU/L (ref 0–32)
AST: 15 IU/L (ref 0–40)
Albumin/Globulin Ratio: 1.3 (ref 1.1–2.5)
CO2: 26 mmol/L (ref 19–28)
Calcium: 9.4 mg/dL (ref 8.6–10.2)
GFR calc non Af Amer: 80 mL/min/{1.73_m2} (ref >59)
Glucose: 92 mg/dL (ref 65–99)
Potassium: 5.1 mmol/L (ref 3.5–5.2)
Sodium: 142 mmol/L (ref 134–144)
Total Protein: 7.4 g/dL (ref 6.0–8.5)

## 2013-03-31 LAB — LIPID PANEL
Cholesterol, Total: 164 mg/dL (ref 100–199)
HDL: 39 mg/dL — ABNORMAL LOW (ref >39)
Triglycerides: 165 mg/dL — ABNORMAL HIGH (ref 0–149)
VLDL Cholesterol Cal: 33 mg/dL (ref 5–40)

## 2013-03-31 LAB — VITAMIN D 1,25 DIHYDROXY: Vit D, 1,25-Dihydroxy: 82.4 pg/mL — ABNORMAL HIGH (ref 10.0–75.0)

## 2013-04-01 LAB — CBC WITH DIFFERENTIAL/PLATELET
Basophils Absolute: 0 10*3/uL (ref 0.0–0.2)
Hemoglobin: 12.2 g/dL (ref 11.1–15.9)
Immature Granulocytes: 0 % (ref 0–2)
Lymphs: 23 % (ref 14–46)
MCHC: 32.4 g/dL (ref 31.5–35.7)
Monocytes: 8 % (ref 4–12)
RDW: 13.7 % (ref 12.3–15.4)
WBC: 7.5 10*3/uL (ref 3.4–10.8)

## 2013-04-01 LAB — HEMOGLOBIN A1C
Est. average glucose Bld gHb Est-mCnc: 128 mg/dL
Hgb A1c MFr Bld: 6.1 % — ABNORMAL HIGH (ref 4.8–5.6)

## 2013-04-26 ENCOUNTER — Encounter: Payer: Self-pay | Admitting: *Deleted

## 2013-04-27 ENCOUNTER — Ambulatory Visit (INDEPENDENT_AMBULATORY_CARE_PROVIDER_SITE_OTHER): Payer: BC Managed Care – PPO | Admitting: Internal Medicine

## 2013-04-27 ENCOUNTER — Encounter: Payer: Self-pay | Admitting: Internal Medicine

## 2013-04-27 VITALS — BP 122/64 | HR 68 | Temp 97.8°F | Resp 14 | Ht 62.0 in | Wt 177.6 lb

## 2013-04-27 DIAGNOSIS — M797 Fibromyalgia: Secondary | ICD-10-CM

## 2013-04-27 DIAGNOSIS — IMO0001 Reserved for inherently not codable concepts without codable children: Secondary | ICD-10-CM

## 2013-04-27 DIAGNOSIS — I10 Essential (primary) hypertension: Secondary | ICD-10-CM

## 2013-04-27 DIAGNOSIS — K5900 Constipation, unspecified: Secondary | ICD-10-CM

## 2013-04-27 MED ORDER — PREGABALIN 100 MG PO CAPS: 100.0000 mg | ORAL_CAPSULE | Freq: Two times a day (BID) | ORAL | Status: DC

## 2013-04-27 NOTE — Progress Notes (Signed)
Patient ID: Mary Collier, female   DOB: 02/05/1951, 62 y.o.   MRN: [id number]  Chief Complaint  Patient presents with  . Medical Managment of Chronic Issues   Allergies  Allergen Reactions  . Influenza Vaccines     D/t being allergic to eggs  . Aspirin   . Celecoxib     Celebrex  . Codeine   . Eggs Or Egg-Derived Products   . Epinephrine   . Erythromycin   . Prednisone   . Prochlorperazine Edisylate     Compazine  . Sulfa Antibiotics   . Tetracycline   . Vioxx (Rofecoxib)    HPI  62 y/o female here for her follow up on her pain. The lyrica that was started last visit seems to have taken the edge off her pain but she still aches.Her energy level has slightly improved. Mood is ok. No acute behavioral changes reported. No suicidal ideations. She has not seen dr Dierdre Forth yet, has upcoming appintment with GI for her rectal area discomfort and lower abdominal discomfort Her bowel regimen has improved  Review of Systems  Constitutional: Positive for fatigue. Negative for fever, chills and appetite change.  HENT: . Negative for hearing loss, congestion, mouth sores, dental problem and sinus pressure.   Eyes: Negative for visual disturbance.        Wears corrective lenses. Last saw eye doctor in jan 2014  Respiratory: Negative for apnea, cough, choking, chest tightness, shortness of breath and wheezing.   Cardiovascular: Negative for chest pain, palpitations and leg swelling.  Gastrointestinal: Positive for constipation. Negative for nausea, vomiting, abdominal pain and blood in stool.  Musculoskeletal: Positive for back pain and arthralgias. Negative for joint swelling and gait problem.  Skin: Negative for wound and rash Neurological: Negative for dizziness, tremors, seizures, light-headedness, numbness and headaches.  Hematological: Negative for adenopathy.  Psychiatric/Behavioral: Negative for suicidal ideas, hallucinations, behavioral problems and agitation.   BP 122/64  Pulse  68  Temp(Src) 97.8 F (36.6 C) (Oral)  Resp 14  Ht 5\' 2"  (1.575 m)  Wt 177 lb 9.6 oz (80.559 kg)  BMI 32.48 kg/m2  Constitutional: She is oriented to person, place, and time. She appears well-developed and well-nourished. No distress.  HENT:   Head: Normocephalic and atraumatic.  Right Ear: External ear normal.  Left Ear: External ear normal.   Nose: Nose normal.   Mouth/Throat: Oropharynx is clear and moist. No oropharyngeal exudate.  Eyes: Conjunctivae and EOM are normal. Pupils are equal, round, and reactive to light.  Neck: Normal range of motion. Neck supple. No JVD present. No tracheal deviation present. No thyromegaly present.  Cardiovascular: Normal rate, regular rhythm, normal heart sounds and intact distal pulses.   Pulmonary/Chest: Effort normal and breath sounds normal. No respiratory distress. She has no wheezes. She has no rales. She exhibits no tenderness.  Abdominal: Soft. Bowel sounds are normal. She exhibits no distension and no mass. There is no rebound.  Musculoskeletal: She exhibits tenderness. She exhibits no edema.  Multiple point tenderness on exam on face, neck, shoulder, arms, back, hip and legs  Lymphadenopathy:    She has no cervical adenopathy.  Neurological: She is alert and oriented to person, place, and time. She has normal reflexes.  Skin: Skin is warm and dry. No rash noted. She is not diaphoretic. No erythema.  Psychiatric:  Flat affect   ASSESSMENT/PLAN  Fibromyalgia- will increase lyrica to 100 mg bid and reassess her in few months. Pt encouraged to exercise  Vit d  def- to take her ca-vit d supplement  Constipation- improved with current bowel regimen. To see gi for screening colonoscopy  HTN- continue current medications, tolerating it well

## 2013-04-30 ENCOUNTER — Other Ambulatory Visit: Payer: Self-pay | Admitting: Gastroenterology

## 2013-05-12 ENCOUNTER — Telehealth: Payer: Self-pay | Admitting: *Deleted

## 2013-05-12 NOTE — Telephone Encounter (Signed)
Patient called and stated that her Hemorrhoids are painful and waking her up at night. Using Preparation H and no relief. Cannot get an appointment until next month with GI Dr. . Please Advise  Uses [facility]

## 2013-05-12 NOTE — Telephone Encounter (Signed)
Send script for sitz bath- to use this 2-3 times a day with warm water to help decrease pain. Use  anusol HC suppository 1 suppository twice a day for 2 weeks. Do provide script for this as well. If no imporvement or wrosens, will need to seen her in office

## 2013-05-13 ENCOUNTER — Other Ambulatory Visit: Payer: Self-pay | Admitting: *Deleted

## 2013-05-13 MED ORDER — SITZ BATH MISC: Status: DC

## 2013-05-13 MED ORDER — HYDROCORTISONE ACETATE 25 MG RE SUPP: 25.0000 mg | Freq: Two times a day (BID) | RECTAL | Status: DC

## 2013-05-13 NOTE — Telephone Encounter (Signed)
Patient Notified and called in Rx into [facility] 

## 2013-05-28 ENCOUNTER — Encounter: Payer: Self-pay | Admitting: Gastroenterology

## 2013-08-03 ENCOUNTER — Ambulatory Visit: Payer: BC Managed Care – PPO | Admitting: Internal Medicine

## 2013-08-03 DIAGNOSIS — Z0289 Encounter for other administrative examinations: Secondary | ICD-10-CM

## 2013-10-20 DIAGNOSIS — D126 Benign neoplasm of colon, unspecified: Secondary | ICD-10-CM

## 2013-10-20 HISTORY — DX: Benign neoplasm of colon, unspecified: D12.6

## 2014-01-02 ENCOUNTER — Encounter: Payer: Self-pay | Admitting: Podiatry

## 2014-01-02 ENCOUNTER — Ambulatory Visit (INDEPENDENT_AMBULATORY_CARE_PROVIDER_SITE_OTHER): Payer: BC Managed Care – PPO | Admitting: Podiatry

## 2014-01-02 VITALS — BP 127/78 | HR 70 | Resp 12

## 2014-01-02 DIAGNOSIS — B351 Tinea unguium: Secondary | ICD-10-CM

## 2014-01-02 DIAGNOSIS — L600 Ingrowing nail: Secondary | ICD-10-CM

## 2014-01-02 NOTE — Progress Notes (Signed)
   Subjective:    Patient ID: Mary Collier, female    DOB: 05/03/1951, 63 y.o.   MRN: [id number]  HPI PT STATED RT FOOT GREAT TOENAIL IS A LITTLE SORE ON THE TOP FOR 1 WEEK. THE TOE IS GETTING BETTER. THE GET AGGRAVATED BY PUTTING PRESSURE. TRIED TO SOAK IT WATER AND SOAP AND POROXIDE ONCE A DAY AND HELPS SOME.     Review of Systems  Skin: Positive for color change.  All other systems reviewed and are negative.       Objective:   Physical Exam        Assessment & Plan:

## 2014-01-04 NOTE — Progress Notes (Signed)
Subjective:     Patient ID: Mary Collier, female   DOB: [birth year], 63 y.o.   MRN: [id number]  HPI patient presents stating I have a painful right big toenail. Not sure by did something to it but it sore and I tried to soak it without relief   Review of Systems  All other systems reviewed and are negative.       Objective:   Physical Exam  Nursing note and vitals reviewed. Constitutional: She is oriented to person, place, and time.  Cardiovascular: Intact distal pulses.   Musculoskeletal: Normal range of motion.  Neurological: She is oriented to person, place, and time.  Skin: Skin is warm.   neurovascular status intact with a painful right hallux distal nail bed one third that is loose with blood flow underneath it noted     Assessment:     Probable trauma to the right hallux nail with damage nail plate    Plan:     H&P performed and conditions discussed I went ahead today and I recommended removal the distal nail plate allowing a new nail to regrow and hopefully will regrow normally with possibility for long term removal of the entire nailbed I infiltrated 60 mg Xylocaine Marcaine mixture and remove the distal one half of the nailbed applied sterile dressing flush the bed and instructed on soaks

## 2014-01-09 ENCOUNTER — Encounter: Payer: Self-pay | Admitting: Gastroenterology

## 2014-02-11 ENCOUNTER — Emergency Department (INDEPENDENT_AMBULATORY_CARE_PROVIDER_SITE_OTHER): Payer: BC Managed Care – PPO

## 2014-02-11 ENCOUNTER — Emergency Department (HOSPITAL_COMMUNITY)
Admission: EM | Admit: 2014-02-11 | Discharge: 2014-02-11 | Disposition: A | Discharge status: Home / Self Care | Payer: BC Managed Care – PPO | Source: Home / Self Care | Attending: Family Medicine | Admitting: Family Medicine

## 2014-02-11 ENCOUNTER — Encounter (HOSPITAL_COMMUNITY): Payer: Self-pay | Admitting: Emergency Medicine

## 2014-02-11 DIAGNOSIS — J020 Streptococcal pharyngitis: Secondary | ICD-10-CM

## 2014-02-11 LAB — POCT RAPID STREP A: Streptococcus, Group A Screen (Direct): NEGATIVE

## 2014-02-11 MED ORDER — AMOXICILLIN 500 MG PO CAPS: 500.0000 mg | ORAL_CAPSULE | Freq: Three times a day (TID) | ORAL | Status: DC

## 2014-02-11 NOTE — ED Notes (Signed)
Pt c/o cold sx onset yest Sx include: nausea, BA, fevers, chills, coughing, ST Denies v/d, SOB, wheezing Has not had any meds of sx Alert w/no signs of acute distress.

## 2014-02-11 NOTE — ED Provider Notes (Signed)
CSN: [id number]     Arrival date & time 02/11/14  1117 History   First MD Initiated Contact with Patient 02/11/14 1227     Chief Complaint  Patient presents with  . URI   (Consider location/radiation/quality/duration/timing/severity/associated sxs/prior Treatment) Patient is a 63 y.o. female presenting with URI. The history is provided by the patient.  URI Presenting symptoms: cough, fever and sore throat   Severity:  Mild Onset quality:  Sudden Duration:  1 day Chronicity:  New Ineffective treatments:  None tried Associated symptoms: myalgias   Risk factors: sick contacts     Past Medical History  Diagnosis Date  . Cancer of kidney     at age 63.  Right kidney.  . Lung cancer     at age 62  Right lung.  . Cancer     at age 80.  abdominal  . Allergic rhinitis   . Chronic headache   . Hiatal hernia   . Internal hemorrhoid   . IBS (irritable bowel syndrome)   . GERD (gastroesophageal reflux disease)   . Peptic stricture of esophagus   . Asthma   . Reflux esophagitis   . Glaucoma   . Hypertension   . Unspecified vitamin D deficiency   . Other and unspecified hyperlipidemia   . Edema   . Other abnormal blood chemistry   . Lump or mass in breast   . Unspecified menopausal and postmenopausal disorder   . Lumbago   . Other convulsions    Past Surgical History  Procedure Laterality Date  . Lung surgery  at age 89    Right  . Nasal sinus surgery  1980s  . Total abdominal hysterectomy  2000s  . Breast surgery  1970s and 1980s    Bilateral cysts removed  . Kidney surgery  at age 9    Right  . Abdominal surgery  at age 44   . Tonsillectomy  1970   Family History  Problem Relation Age of Onset  . Allergies Sister     x3  . Asthma Sister   . Heart disease Other     2 siblings  . Bone cancer Father   . Lung cancer Father   . Cancer Father   . Breast cancer Mother   . Cancer Mother   . Cancer Brother   . Hypertension Brother   . Diabetes Brother   . Heart  disease Sister   . Diabetes Sister   . Hypertension Sister    History  Substance Use Topics  . Smoking status: Never Smoker   . Smokeless tobacco: Never Used  . Alcohol Use: No   OB History   Grav Para Term Preterm Abortions TAB SAB Ect Mult Living                 Review of Systems  Constitutional: Positive for fever and chills.  HENT: Positive for sore throat.   Respiratory: Positive for cough.   Cardiovascular: Negative.   Gastrointestinal: Negative.   Musculoskeletal: Positive for myalgias.    Allergies  Influenza vaccines; Aspirin; Celecoxib; Codeine; Eggs or egg-derived products; Epinephrine; Erythromycin; Prednisone; Prochlorperazine edisylate; Sulfa antibiotics; Tetracycline; and Vioxx  Home Medications   Prior to Admission medications   Medication Sig Start Date End Date Taking? Authorizing Provider  atorvastatin (LIPITOR) 10 MG tablet Take 1 tablet (10 mg total) by mouth daily. 03/29/13  Yes Mahima Bubba Camp, MD  folic acid (FOLVITE) 1 MG tablet Take 1 mg by mouth daily.  Yes Historical Provider, MD  metoprolol succinate (TOPROL-XL) 25 MG 24 hr tablet Take 1 tablet (25 mg total) by mouth daily. 03/29/13  Yes Mahima Bubba Camp, MD  acetaminophen (TYLENOL) 325 MG tablet Take 325 mg by mouth as needed.      Historical Provider, MD  bimatoprost (LUMIGAN) 0.03 % ophthalmic solution Place 1 drop into both eyes at bedtime.      Historical Provider, MD  glycopyrrolate (ROBINUL) 2 MG tablet Take 2 mg by mouth 2 (two) times daily as needed. 02/17/12 03/29/13  Ladene Artist, MD  hydrocortisone (ANUSOL-HC) 25 MG suppository Place 1 suppository (25 mg total) rectally 2 (two) times daily. 05/13/13   Blanchie Serve, MD  Misc. Devices (SITZ BATH) MISC Bath in tub with warm water 2-3 times daily 05/13/13   Blanchie Serve, MD  omeprazole (PRILOSEC) 40 MG capsule TAKE 1 CAPSULE (40 MG TOTAL) BY MOUTH DAILY. 05/24/12   Ladene Artist, MD  pregabalin (LYRICA) 100 MG capsule Take 1 capsule (100 mg  total) by mouth 2 (two) times daily. 04/27/13   Mahima Pandey, MD   BP 146/70  Pulse 68  Temp(Src) 98.6 F (37 C) (Oral)  Resp 16  SpO2 99% Physical Exam  Nursing note and vitals reviewed. Constitutional: She is oriented to person, place, and time. She appears well-developed and well-nourished. No distress.  HENT:  Right Ear: External ear normal.  Left Ear: External ear normal.  Mouth/Throat: Uvula is midline and mucous membranes are normal. No uvula swelling. Posterior oropharyngeal erythema present. No oropharyngeal exudate.  Cardiovascular: Normal heart sounds.   Pulmonary/Chest: Breath sounds normal.  Lymphadenopathy:    She has cervical adenopathy.  Neurological: She is alert and oriented to person, place, and time.  Skin: Skin is warm and dry.    ED Course  Procedures (including critical care time) Labs Review Labs Reviewed  CULTURE, GROUP A STREP  POCT RAPID STREP A ([facility] ONLY)   strep neg. Imaging Review Dg Chest 2 View  02/11/2014   CLINICAL DATA:  Cough, fever  EXAM: CHEST  2 VIEW  COMPARISON:  CT chest dated 03/25/2013  FINDINGS: Stable eventration of the hemidiaphragm. Mild right basilar scarring/ atelectasis. No focal consolidation. No pleural effusion or pneumothorax.  The heart is normal in size.  Mild degenerative changes of the visualized thoracolumbar spine.  IMPRESSION: No evidence of acute cardiopulmonary disease.   Electronically Signed   By: Julian Hy M.D.   On: 02/11/2014 12:44     MDM   1. Strep sore throat        Billy Fischer, MD 02/11/14 2005

## 2014-02-11 NOTE — Discharge Instructions (Signed)
Drink lots of fluids, take all of medicine, use lozenges as needed.return if needed °

## 2014-02-13 LAB — CULTURE, GROUP A STREP

## 2014-03-20 ENCOUNTER — Encounter: Payer: Self-pay | Admitting: Gastroenterology

## 2014-03-23 ENCOUNTER — Encounter (HOSPITAL_COMMUNITY): Payer: Self-pay | Admitting: Emergency Medicine

## 2014-03-23 ENCOUNTER — Emergency Department (HOSPITAL_COMMUNITY)
Admission: EM | Admit: 2014-03-23 | Discharge: 2014-03-23 | Disposition: A | Discharge status: Home / Self Care | Payer: BC Managed Care – PPO | Source: Home / Self Care | Attending: Family Medicine | Admitting: Family Medicine

## 2014-03-23 DIAGNOSIS — S39012A Strain of muscle, fascia and tendon of lower back, initial encounter: Secondary | ICD-10-CM

## 2014-03-23 DIAGNOSIS — S335XXA Sprain of ligaments of lumbar spine, initial encounter: Secondary | ICD-10-CM

## 2014-03-23 MED ORDER — TRAMADOL HCL 50 MG PO TABS: 50.0000 mg | ORAL_TABLET | Freq: Four times a day (QID) | ORAL | Status: DC | PRN

## 2014-03-23 MED ORDER — CYCLOBENZAPRINE HCL 5 MG PO TABS: 5.0000 mg | ORAL_TABLET | Freq: Three times a day (TID) | ORAL | Status: DC | PRN

## 2014-03-23 MED ORDER — KETOROLAC TROMETHAMINE 30 MG/ML IJ SOLN
INTRAMUSCULAR | Status: AC
  Filled 2014-03-23: qty 1

## 2014-03-23 MED ORDER — KETOROLAC TROMETHAMINE 30 MG/ML IJ SOLN: 30.0000 mg | Freq: Once | INTRAMUSCULAR | Status: DC

## 2014-03-23 MED ORDER — KETOROLAC TROMETHAMINE 30 MG/ML IJ SOLN
30.0000 mg | Freq: Once | INTRAMUSCULAR | Status: AC
  Administered 2014-03-23: 30 mg via INTRAMUSCULAR

## 2014-03-23 NOTE — ED Notes (Signed)
Denies any S/S allergic reaction.  States pain starting to improve.

## 2014-03-23 NOTE — ED Notes (Signed)
Assessment per Dr. Juventino Slovak.

## 2014-03-23 NOTE — ED Provider Notes (Signed)
CSN: [id number]     Arrival date & time 03/23/14  1743 History   First MD Initiated Contact with Patient 03/23/14 1759     Chief Complaint  Patient presents with  . Back Pain   (Consider location/radiation/quality/duration/timing/severity/associated sxs/prior Treatment) Patient is a 63 y.o. female presenting with back pain. The history is provided by the patient.  Back Pain Location:  Lumbar spine Quality:  Shooting Radiates to:  Does not radiate Pain severity:  Mild Onset quality:  Gradual Duration:  3 days Progression:  Unchanged Chronicity:  New Context: lifting heavy objects   Context comment:  Moving her office, boxes etc. Worsened by:  Nothing tried Ineffective treatments:  None tried Associated symptoms: no abdominal pain, no bladder incontinence, no chest pain, no fever, no leg pain, no numbness, no paresthesias, no pelvic pain, no perianal numbness, no tingling and no weakness     Past Medical History  Diagnosis Date  . Cancer of kidney     at age 72.  Right kidney.  . Lung cancer     at age 39  Right lung.  . Cancer     at age 41.  abdominal  . Allergic rhinitis   . Chronic headache   . Hiatal hernia   . Internal hemorrhoid   . IBS (irritable bowel syndrome)   . GERD (gastroesophageal reflux disease)   . Peptic stricture of esophagus   . Asthma   . Reflux esophagitis   . Glaucoma   . Hypertension   . Unspecified vitamin D deficiency   . Other and unspecified hyperlipidemia   . Edema   . Other abnormal blood chemistry   . Lump or mass in breast   . Unspecified menopausal and postmenopausal disorder   . Lumbago   . Other convulsions    Past Surgical History  Procedure Laterality Date  . Lung surgery  at age 9    Right  . Nasal sinus surgery  1980s  . Total abdominal hysterectomy  2000s  . Breast surgery  1970s and 1980s    Bilateral cysts removed  . Kidney surgery  at age 44    Right  . Abdominal surgery  at age 38   . Tonsillectomy  1970    Family History  Problem Relation Age of Onset  . Allergies Sister     x3  . Asthma Sister   . Heart disease Other     2 siblings  . Bone cancer Father   . Lung cancer Father   . Cancer Father   . Breast cancer Mother   . Cancer Mother   . Cancer Brother   . Hypertension Brother   . Diabetes Brother   . Heart disease Sister   . Diabetes Sister   . Hypertension Sister    History  Substance Use Topics  . Smoking status: Never Smoker   . Smokeless tobacco: Never Used  . Alcohol Use: No   OB History   Grav Para Term Preterm Abortions TAB SAB Ect Mult Living                 Review of Systems  Constitutional: Negative.  Negative for fever.  Cardiovascular: Negative for chest pain.  Gastrointestinal: Negative.  Negative for abdominal pain.  Genitourinary: Negative for bladder incontinence and pelvic pain.  Musculoskeletal: Positive for back pain. Negative for gait problem and joint swelling.  Skin: Negative.   Neurological: Negative for tingling, weakness, numbness and paresthesias.    Allergies  Influenza vaccines; Aspirin; Celecoxib; Codeine; Eggs or egg-derived products; Epinephrine; Erythromycin; Prednisone; Prochlorperazine edisylate; Sulfa antibiotics; Tetracycline; and Vioxx  Home Medications   Prior to Admission medications   Medication Sig Start Date End Date Taking? Authorizing Provider  acetaminophen (TYLENOL) 325 MG tablet Take 325 mg by mouth as needed.     Yes Historical Provider, MD  atorvastatin (LIPITOR) 10 MG tablet Take 1 tablet (10 mg total) by mouth daily. 03/29/13  Yes Mahima Pandey, MD  bimatoprost (LUMIGAN) 0.03 % ophthalmic solution Place 1 drop into both eyes at bedtime.     Yes Historical Provider, MD  folic acid (FOLVITE) 1 MG tablet Take 1 mg by mouth daily.   Yes Historical Provider, MD  metoprolol succinate (TOPROL-XL) 25 MG 24 hr tablet Take 1 tablet (25 mg total) by mouth daily. 03/29/13  Yes Blanchie Serve, MD  Misc. Devices (SITZ BATH)  MISC Bath in tub with warm water 2-3 times daily 05/13/13  Yes Mahima Pandey, MD  omeprazole (PRILOSEC) 40 MG capsule TAKE 1 CAPSULE (40 MG TOTAL) BY MOUTH DAILY. 05/24/12  Yes Ladene Artist, MD  amoxicillin (AMOXIL) 500 MG capsule Take 1 capsule (500 mg total) by mouth 3 (three) times daily. 02/11/14   Billy Fischer, MD  cyclobenzaprine (FLEXERIL) 5 MG tablet Take 1 tablet (5 mg total) by mouth 3 (three) times daily as needed for muscle spasms. 03/23/14   Billy Fischer, MD  glycopyrrolate (ROBINUL) 2 MG tablet Take 2 mg by mouth 2 (two) times daily as needed. 02/17/12 03/29/13  Ladene Artist, MD  hydrocortisone (ANUSOL-HC) 25 MG suppository Place 1 suppository (25 mg total) rectally 2 (two) times daily. 05/13/13   Blanchie Serve, MD  pregabalin (LYRICA) 100 MG capsule Take 1 capsule (100 mg total) by mouth 2 (two) times daily. 04/27/13   Blanchie Serve, MD  traMADol (ULTRAM) 50 MG tablet Take 1 tablet (50 mg total) by mouth every 6 (six) hours as needed. For back pain 03/23/14   Billy Fischer, MD   BP 179/97  Pulse 76  Temp(Src) 99.2 F (37.3 C) (Oral)  Resp 20  SpO2 99% Physical Exam  Nursing note and vitals reviewed. Constitutional: She is oriented to person, place, and time. She appears well-developed and well-nourished.  Abdominal: Soft. Bowel sounds are normal.  Musculoskeletal: She exhibits tenderness.       Lumbar back: She exhibits normal range of motion, no spasm and normal pulse.       Back:  Neurological: She is alert and oriented to person, place, and time.  Skin: Skin is warm and dry.    ED Course  Procedures (including critical care time) Labs Review Labs Reviewed - No data to display  Imaging Review No results found.   MDM   1. Low back strain        Billy Fischer, MD 03/23/14 [phone]

## 2014-04-05 ENCOUNTER — Encounter: Payer: Self-pay | Admitting: Internal Medicine

## 2014-04-11 ENCOUNTER — Encounter: Payer: Self-pay | Admitting: Internal Medicine

## 2014-04-11 ENCOUNTER — Ambulatory Visit (INDEPENDENT_AMBULATORY_CARE_PROVIDER_SITE_OTHER): Payer: BC Managed Care – PPO | Admitting: Internal Medicine

## 2014-04-11 VITALS — BP 142/88 | HR 65 | Temp 98.1°F | Resp 18 | Ht 62.01 in | Wt 176.2 lb

## 2014-04-11 DIAGNOSIS — E785 Hyperlipidemia, unspecified: Secondary | ICD-10-CM

## 2014-04-11 DIAGNOSIS — K5900 Constipation, unspecified: Secondary | ICD-10-CM

## 2014-04-11 DIAGNOSIS — R739 Hyperglycemia, unspecified: Secondary | ICD-10-CM

## 2014-04-11 DIAGNOSIS — I10 Essential (primary) hypertension: Secondary | ICD-10-CM

## 2014-04-11 DIAGNOSIS — Z0000 Encounter for general adult medical examination without abnormal findings: Secondary | ICD-10-CM

## 2014-04-11 DIAGNOSIS — R7309 Other abnormal glucose: Secondary | ICD-10-CM

## 2014-04-11 DIAGNOSIS — E559 Vitamin D deficiency, unspecified: Secondary | ICD-10-CM

## 2014-04-11 MED ORDER — ATORVASTATIN CALCIUM 10 MG PO TABS: 10.0000 mg | ORAL_TABLET | Freq: Every day | ORAL | Status: DC

## 2014-04-11 MED ORDER — SENNOSIDES-DOCUSATE SODIUM 8.6-50 MG PO TABS: 1.0000 | ORAL_TABLET | Freq: Every day | ORAL | Status: DC

## 2014-04-11 MED ORDER — POLYETHYLENE GLYCOL 3350 17 GM/SCOOP PO POWD: 17.0000 g | Freq: Every day | ORAL | Status: DC

## 2014-04-11 MED ORDER — METOPROLOL SUCCINATE ER 25 MG PO TB24: 25.0000 mg | ORAL_TABLET | Freq: Every day | ORAL | Status: DC

## 2014-04-11 MED ORDER — PNEUMOCOCCAL 13-VAL CONJ VACC IM SUSP: 0.5000 mL | Freq: Once | INTRAMUSCULAR | Status: DC

## 2014-04-11 MED ORDER — OMEPRAZOLE 40 MG PO CPDR: 40.0000 mg | DELAYED_RELEASE_CAPSULE | Freq: Every day | ORAL | Status: DC

## 2014-04-11 MED ORDER — ZOSTER VACCINE LIVE 19400 UNT/0.65ML ~~LOC~~ SOLR: 0.6500 mL | Freq: Once | SUBCUTANEOUS | Status: DC

## 2014-04-11 MED ORDER — HYDROCORTISONE ACETATE 25 MG RE SUPP: 25.0000 mg | Freq: Two times a day (BID) | RECTAL | Status: DC | PRN

## 2014-04-11 NOTE — Progress Notes (Signed)
Patient ID: Mary Collier, female   DOB: 1951-02-17, 63 y.o.   MRN: [id number]    Chief Complaint  Patient presents with  . Annual Exam   Allergies  Allergen Reactions  . Influenza Vaccines     D/t being allergic to eggs  . Aspirin     States able to take IBU  . Celecoxib     Able to take IBU  . Codeine   . Eggs Or Egg-Derived Products   . Epinephrine   . Erythromycin   . Prednisone   . Prochlorperazine Edisylate     Compazine  . Sulfa Antibiotics   . Tetracycline   . Vioxx [Rofecoxib]     States able to take IBU   HPI 63 y/o female patient is here for annual exam. She has history of HTN, fibromyalgia, hyperlipidmeia, vitamin d deficiency among others. Has been constipated and has heartburn more recently  Review of Systems  Constitutional: Negative for fever, chills, weight loss, malaise/fatigue and diaphoresis.  HENT: Negative for congestion, hearing loss and sore throat.   Eyes: Negative for blurred vision, double vision and discharge.  Respiratory: Negative for cough, sputum production, shortness of breath and wheezing.   Cardiovascular: Negative for chest pain, palpitations, orthopnea and leg swelling.  Gastrointestinal: Negative for nausea, vomiting, diarrhea. Has heartburn at night and has constipation. Last bowel movement yesterday and then a week prior. Denies melena or rectal bleed. Strains with bowel movement. occassional left lower quadrant pain which gets better after bowel movement. occassioanlly takes senna which helps with her bowel movement Genitourinary: Negative for dysuria, urgency, frequency and flank pain.  Musculoskeletal: Negative for back pain, falls, joint pain and myalgias.  Skin: Negative for itching and rash.  Neurological: Negative for dizziness, tingling, focal weakness and headaches.  Psychiatric/Behavioral: Negative for depression and memory loss. The patient is not nervous/anxious.    Wt Readings from Last 3 Encounters:  04/11/14 176 lb 3.2  oz (79.924 kg)  04/27/13 177 lb 9.6 oz (80.559 kg)  03/29/13 176 lb 3.2 oz (79.924 kg)   Past Medical History  Diagnosis Date  . Cancer of kidney     at age 76.  Right kidney.  . Lung cancer     at age 75  Right lung.  . Cancer     at age 46.  abdominal  . Allergic rhinitis   . Chronic headache   . Hiatal hernia   . Internal hemorrhoid   . IBS (irritable bowel syndrome)   . GERD (gastroesophageal reflux disease)   . Peptic stricture of esophagus   . Asthma   . Reflux esophagitis   . Glaucoma   . Hypertension   . Unspecified vitamin D deficiency   . Other and unspecified hyperlipidemia   . Edema   . Other abnormal blood chemistry   . Lump or mass in breast   . Unspecified menopausal and postmenopausal disorder   . Lumbago   . Other convulsions    Current Outpatient Prescriptions on File Prior to Visit  Medication Sig Dispense Refill  . acetaminophen (TYLENOL) 325 MG tablet Take 325 mg by mouth as needed.        Marland Kitchen atorvastatin (LIPITOR) 10 MG tablet Take 1 tablet (10 mg total) by mouth daily.  90 tablet  3  . bimatoprost (LUMIGAN) 0.03 % ophthalmic solution Place 1 drop into both eyes at bedtime.        . folic acid (FOLVITE) 1 MG tablet Take 1 mg by  mouth daily.      . metoprolol succinate (TOPROL-XL) 25 MG 24 hr tablet Take 1 tablet (25 mg total) by mouth daily.  90 tablet  3   No current facility-administered medications on file prior to visit.   Past Surgical History  Procedure Laterality Date  . Lung surgery  at age 79    Right  . Nasal sinus surgery  1980s  . Total abdominal hysterectomy  2000s  . Breast surgery  1970s and 1980s    Bilateral cysts removed  . Kidney surgery  at age 89    Right  . Abdominal surgery  at age 77   . Tonsillectomy  1970   Family History  Problem Relation Age of Onset  . Allergies Sister     x3  . Asthma Sister   . Heart disease Other     2 siblings  . Bone cancer Father   . Lung cancer Father   . Cancer Father   . Breast  cancer Mother   . Cancer Mother   . Cancer Brother   . Hypertension Brother   . Diabetes Brother   . Heart disease Sister   . Diabetes Sister   . Hypertension Sister    History   Social History  . Marital Status: Married    Spouse Name: N/A    Number of Children: 0  . Years of Education: N/A   Occupational History  . education specialist (preschool)     Social History Main Topics  . Smoking status: Never Smoker   . Smokeless tobacco: Never Used  . Alcohol Use: No  . Drug Use: No  . Sexual Activity: Not on file   Other Topics Concern  . Not on file   Social History Narrative   Pt has 18 siblings.   No caffeine drinks    Physical exam BP 142/88  Pulse 65  Temp(Src) 98.1 F (36.7 C) (Oral)  Resp 18  Ht 5' 2.01" (1.575 m)  Wt 176 lb 3.2 oz (79.924 kg)  BMI 32.22 kg/m2  SpO2 98%  General- elderly female in no acute distress Head- atraumatic, normocephalic Eyes- PERRLA, EOMI, no pallor, no icterus, no discharge Ears- left ear normal tympanic membrane and normal external ear canal , right ear normal tympanic membrane and normal external ear canal Neck- no lymphadenopathy, no thyromegaly, no jugular vein distension, no carotid bruit Nose- normal nasaal mucosa, no maxillary sinus tenderness, no frontal sinus tenderness Mouth- normal mucus membrane, no oral thrush, normal oropharynx Chest- no chest wall deformities, no chest wall tenderness Breast- no masses, no palpable lumps, normal nipple and areola exam, no axillary lymphadenopathy Cardiovascular- normal s1,s2, no murmurs/ rubs/ gallops, normal distal pulses Respiratory- bilateral clear to auscultation, no wheeze, no rhonchi, no crackles Abdomen- bowel sounds present, soft, non tender, no organomegaly, no abdominal bruits, no guarding or rigidity, no CVA tenderness Pelvic exam- from her Gyn Musculoskeletal- able to move all 4 extremities, no spinal and paraspinal tenderness, steady gait, no use of assistive device,  normal range of motion, no leg edema Neurological- no focal deficit, normal reflexes, normal muscle strength, normal sensation to fine touch and vibration Skin- warm and dry Psychiatry- alert and oriented to person, place and time, normal mood and affect  Labs-  CBC    Component Value Date/Time   WBC 7.5 03/31/2013 0848   WBC 5.8 07/06/2007 1040   RBC 4.25 03/31/2013 0848   RBC 4.49 07/06/2007 1040   HGB 12.2 03/31/2013 0848  HCT 37.6 03/31/2013 0848   PLT 286 07/06/2007 1040   MCV 89 03/31/2013 0848   MCH 28.7 03/31/2013 0848   MCHC 32.4 03/31/2013 0848   MCHC 33.8 07/06/2007 1040   RDW 13.7 03/31/2013 0848   RDW 14.0 07/06/2007 1040   LYMPHSABS 1.7 03/31/2013 0848   LYMPHSABS 1.6 07/06/2007 1040   MONOABS 0.5 07/06/2007 1040   EOSABS 0.1 03/31/2013 0848   EOSABS 0.0 07/06/2007 1040   BASOSABS 0.0 03/31/2013 0848   BASOSABS 0.0 07/06/2007 1040    CMP     Component Value Date/Time   NA 142 03/29/2013 0951   NA 135 07/06/2007 1040   K 5.1 03/29/2013 0951   CL 103 03/29/2013 0951   CO2 26 03/29/2013 0951   GLUCOSE 92 03/29/2013 0951   GLUCOSE 94 07/06/2007 1040   BUN 14 03/29/2013 0951   BUN 12 07/06/2007 1040   CREATININE 0.80 03/29/2013 0951   CALCIUM 9.4 03/29/2013 0951   PROT 7.4 03/29/2013 0951   PROT 7.1 07/06/2007 1040   ALBUMIN 3.7 07/06/2007 1040   AST 15 03/29/2013 0951   ALT 17 03/29/2013 0951   ALKPHOS 86 03/29/2013 0951   BILITOT 0.2 03/29/2013 0951   GFRNONAA 80 03/29/2013 0951   GFRAA 92 03/29/2013 0951   Lipid Panel     Component Value Date/Time   TRIG 165* 03/29/2013 0951   HDL 39* 03/29/2013 0951   CHOLHDL 4.2 03/29/2013 0951   LDLCALC 92 03/29/2013 0951   Lab Results  Component Value Date   HGBA1C 6.1* 03/31/2013   04/11/14 ekg- normal sinus rhythm, normal axis  Assessment/plan  1. Essential hypertension Stable bp reading. Continue toprol xl 25 mg daily - CBC with Differential - CMP - TSH  2. Unspecified constipation Has pending colonoscopy. With ocassional left  lower quadrant discomfort and her constipation. Concern of some diverticulae. To take senna s 1 tab daily and miralax 17 g daily for now and reassess. Continue suppository for her hemorrhoids  3. Hyperglycemia Recheck a1c - Hemoglobin A1c - TSH  4. Other and unspecified hyperlipidemia Recheck lipid panel and continue lipitor - Lipid Panel  5. Unspecified vitamin D deficiency dexa scan and vit d check.  - Vitamin D, 1,25-dihydroxy  6. Routine general medical examination at a health care facility the patient was counseled regarding the appropriate use of alcohol, regular self-examination of the breasts on a monthly basis, prevention of dental and periodontal disease, diet, regular sustained exercise for at least 30 minutes 5 times per week, routine screening interval for mammogram as recommended by the [place] and ACOG, the proper use of sunscreen and protective clothing, tobacco use, and recommended schedule for GI hemoccult testing, colonoscopy, cholesterol, thyroid and diabetes screening. Has upcoming eye exam 8/15. Has upcoming colonoscopy. Mammogram. dexa scan and pap smear. prevnar provided today. Has allergy to egg and cant take influenza vaccine. Script for zoster provided

## 2014-04-12 LAB — COMPREHENSIVE METABOLIC PANEL
A/G RATIO: 1.3 (ref 1.1–2.5)
ALBUMIN: 4.2 g/dL (ref 3.6–4.8)
ALK PHOS: 85 IU/L (ref 39–117)
ALT: 28 IU/L (ref 0–32)
AST: 23 IU/L (ref 0–40)
BUN / CREAT RATIO: 17 (ref 11–26)
BUN: 14 mg/dL (ref 8–27)
CO2: 23 mmol/L (ref 18–29)
Calcium: 9.5 mg/dL (ref 8.7–10.3)
Chloride: 101 mmol/L (ref 97–108)
Creatinine, Ser: 0.81 mg/dL (ref 0.57–1.00)
GFR, EST AFRICAN AMERICAN: 90 mL/min/{1.73_m2} (ref >59)
GFR, EST NON AFRICAN AMERICAN: 78 mL/min/{1.73_m2} (ref >59)
GLOBULIN, TOTAL: 3.2 g/dL (ref 1.5–4.5)
Glucose: 79 mg/dL (ref 65–99)
Potassium: 4.7 mmol/L (ref 3.5–5.2)
Sodium: 138 mmol/L (ref 134–144)
TOTAL PROTEIN: 7.4 g/dL (ref 6.0–8.5)
Total Bilirubin: <0.2 mg/dL (ref 0.0–1.2)

## 2014-04-12 LAB — CBC WITH DIFFERENTIAL/PLATELET
BASOS: 0 %
Basophils Absolute: 0 10*3/uL (ref 0.0–0.2)
Eos: 1 %
Eosinophils Absolute: 0.1 10*3/uL (ref 0.0–0.4)
HCT: 36.9 % (ref 34.0–46.6)
HEMOGLOBIN: 12.3 g/dL (ref 11.1–15.9)
Immature Grans (Abs): 0 10*3/uL (ref 0.0–0.1)
Immature Granulocytes: 0 %
Lymphocytes Absolute: 2.3 10*3/uL (ref 0.7–3.1)
Lymphs: 30 %
MCH: 28.9 pg (ref 26.6–33.0)
MCHC: 33.3 g/dL (ref 31.5–35.7)
MCV: 87 fL (ref 79–97)
MONOCYTES: 8 %
Monocytes Absolute: 0.6 10*3/uL (ref 0.1–0.9)
NEUTROS ABS: 4.6 10*3/uL (ref 1.4–7.0)
Neutrophils Relative %: 61 %
RBC: 4.25 x10E6/uL (ref 3.77–5.28)
RDW: 14.2 % (ref 12.3–15.4)
WBC: 7.6 10*3/uL (ref 3.4–10.8)

## 2014-04-12 LAB — LIPID PANEL
CHOL/HDL RATIO: 3.3 ratio (ref 0.0–4.4)
Cholesterol, Total: 153 mg/dL (ref 100–199)
HDL: 46 mg/dL (ref >39)
LDL CALC: 80 mg/dL (ref 0–99)
Triglycerides: 137 mg/dL (ref 0–149)
VLDL CHOLESTEROL CAL: 27 mg/dL (ref 5–40)

## 2014-04-12 LAB — HEMOGLOBIN A1C
ESTIMATED AVERAGE GLUCOSE: 128 mg/dL
Hgb A1c MFr Bld: 6.1 % — ABNORMAL HIGH (ref 4.8–5.6)

## 2014-04-12 LAB — TSH: TSH: 2.36 u[IU]/mL (ref 0.450–4.500)

## 2014-04-12 LAB — VITAMIN D 1,25 DIHYDROXY: VIT D 1 25 DIHYDROXY: 54.8 pg/mL (ref 19.9–79.3)

## 2014-04-13 ENCOUNTER — Encounter: Payer: Self-pay | Admitting: *Deleted

## 2014-04-14 ENCOUNTER — Encounter: Payer: Self-pay | Admitting: Internal Medicine

## 2014-05-09 ENCOUNTER — Ambulatory Visit (AMBULATORY_SURGERY_CENTER): Payer: Self-pay | Admitting: *Deleted

## 2014-05-09 VITALS — Ht 61.0 in | Wt 173.0 lb

## 2014-05-09 DIAGNOSIS — Z1211 Encounter for screening for malignant neoplasm of colon: Secondary | ICD-10-CM

## 2014-05-09 MED ORDER — MOVIPREP 100 G PO SOLR: ORAL | Status: DC

## 2014-05-09 NOTE — Progress Notes (Signed)
ALLERGY TO EGGS: CAUSES RASH. No problems with anesthesia.  Pt given Emmi instructions for colonoscopy  No oxygen use  No diet drug use

## 2014-05-12 ENCOUNTER — Encounter: Payer: Self-pay | Admitting: Gastroenterology

## 2014-05-13 ENCOUNTER — Other Ambulatory Visit: Payer: Self-pay | Admitting: Internal Medicine

## 2014-05-23 ENCOUNTER — Ambulatory Visit (AMBULATORY_SURGERY_CENTER): Payer: BC Managed Care – PPO | Admitting: Gastroenterology

## 2014-05-23 ENCOUNTER — Encounter: Payer: Self-pay | Admitting: Gastroenterology

## 2014-05-23 VITALS — BP 149/55 | HR 66 | Temp 97.1°F | Resp 21 | Ht 61.0 in | Wt 173.0 lb

## 2014-05-23 DIAGNOSIS — Z1211 Encounter for screening for malignant neoplasm of colon: Secondary | ICD-10-CM

## 2014-05-23 DIAGNOSIS — D126 Benign neoplasm of colon, unspecified: Secondary | ICD-10-CM

## 2014-05-23 MED ORDER — SODIUM CHLORIDE 0.9 % IV SOLN: 500.0000 mL | INTRAVENOUS | Status: DC

## 2014-05-23 NOTE — Progress Notes (Signed)
Report to PACU, RN, vss, BBS= Clear.  

## 2014-05-23 NOTE — Patient Instructions (Addendum)
YOU HAD AN ENDOSCOPIC PROCEDURE TODAY AT THE [facility]: Refer to the procedure report that was given to you for any specific questions about what was found during the examination.  If the procedure report does not answer your questions, please call your gastroenterologist to clarify.  If you requested that your care partner not be given the details of your procedure findings, then the procedure report has been included in a sealed envelope for you to review at your convenience later.  YOU SHOULD EXPECT: Some feelings of bloating in the abdomen. Passage of more gas than usual.  Walking can help get rid of the air that was put into your GI tract during the procedure and reduce the bloating. If you had a lower endoscopy (such as a colonoscopy or flexible sigmoidoscopy) you may notice spotting of blood in your stool or on the toilet paper. If you underwent a bowel prep for your procedure, then you may not have a normal bowel movement for a few days.  DIET: Your first meal following the procedure should be a light meal and then it is ok to progress to your normal diet.  A half-sandwich or bowl of soup is an example of a good first meal.  Heavy or fried foods are harder to digest and may make you feel nauseous or bloated.  Likewise meals heavy in dairy and vegetables can cause extra gas to form and this can also increase the bloating.  Drink plenty of fluids but you should avoid alcoholic beverages for 24 hours.  ACTIVITY: Your care partner should take you home directly after the procedure.  You should plan to take it easy, moving slowly for the rest of the day.  You can resume normal activity the day after the procedure however you should NOT DRIVE or use heavy machinery for 24 hours (because of the sedation medicines used during the test).    SYMPTOMS TO REPORT IMMEDIATELY: A gastroenterologist can be reached at any hour.  During normal business hours, 8:30 AM to 5:00 PM Monday through Friday,  call [phone].  After hours and on weekends, please call the GI answering service at [phone] who will take a message and have the physician on call contact you.   Following lower endoscopy (colonoscopy or flexible sigmoidoscopy):  Excessive amounts of blood in the stool  Significant tenderness or worsening of abdominal pains  Swelling of the abdomen that is new, acute  Fever of 100F or higher  FOLLOW UP: If any biopsies were taken you will be contacted by phone or by letter within the next 1-3 weeks.  Call your gastroenterologist if you have not heard about the biopsies in 3 weeks.  Our staff will call the home number listed on your records the next business day following your procedure to check on you and address any questions or concerns that you may have at that time regarding the information given to you following your procedure. This is a courtesy call and so if there is no answer at the home number and we have not heard from you through the emergency physician on call, we will assume that you have returned to your regular daily activities without incident.  SIGNATURES/CONFIDENTIALITY: You and/or your care partner have signed paperwork which will be entered into your electronic medical record.  These signatures attest to the fact that that the information above on your After Visit Summary has been reviewed and is understood.  Full responsibility of the confidentiality of this   discharge information lies with you and/or your care-partner.  Read the handouts given to you by your recovery room nurse.

## 2014-05-23 NOTE — Op Note (Signed)
[facility]  [address] [place], [id number]   COLONOSCOPY PROCEDURE REPORT  PATIENT: Mary Collier, Mary Collier  MR#: [id number] BIRTHDATE: 04/19/1951 , 62  yrs. old GENDER: Female ENDOSCOPIST: Ladene Artist, MD, [facility] PROCEDURE DATE:  05/23/2014 PROCEDURE:   Colonoscopy with biopsy and snare polypectomy First Screening Colonoscopy - Avg.  risk and is 50 yrs.  old or older - No.  Prior Negative Screening - Now for repeat screening. 10 or more years since last screening  History of Adenoma - Now for follow-up colonoscopy & has been > or = to 3 yrs.  N/A  Polyps Removed Today? Yes. ASA CLASS:   Class III INDICATIONS:average risk screening. MEDICATIONS: MAC sedation, administered by CRNA and propofol (Diprivan) 270mg  IV DESCRIPTION OF PROCEDURE:   After the risks benefits and alternatives of the procedure were thoroughly explained, informed consent was obtained.  A digital rectal exam revealed no abnormalities of the rectum.   The LB [id number]  endoscope was introduced through the anus and advanced to the cecum, which was identified by both the appendix and ileocecal valve. No adverse events experienced.   The quality of the prep was good, using MoviPrep  The instrument was then slowly withdrawn as the colon was fully examined.  COLON FINDINGS: A semi-pedunculated polyp measuring 8 mm in size was found in the descending colon.  A polypectomy was performed with a cold snare.  The resection was complete and the polyp tissue was completely retrieved.   A sessile polyp measuring 4 mm in size was found in the sigmoid colon.  A polypectomy was performed with cold forceps.  The resection was complete and the polyp tissue was completely retrieved.   The colon was otherwise normal.  There was no diverticulosis, inflammation, polyps or cancers unless previously stated.  Retroflexed views revealed moderate internal hemorrhoids. The time to cecum=2 minutes 22 seconds.   Withdrawal time=10 minutes 32 seconds.  The scope was withdrawn and the procedure completed. COMPLICATIONS: There were no complications.  ENDOSCOPIC IMPRESSION: 1.   Semi-pedunculated polyp measuring 8 mm in the descending colon; polypectomy performed with a cold snare 2.   Sessile polyp measuring 4 mm in the sigmoid colon; polypectomy performed with cold forceps 3.   Moderate internal hemorrhoids  RECOMMENDATIONS: 1.  Await pathology results 2.  Repeat colonoscopy in 5 years if polyp adenomatous; otherwise 10 years  eSigned:  Ladene Artist, MD, [facility] 05/23/2014 8:54 AM

## 2014-05-23 NOTE — Progress Notes (Signed)
Called to room to assist during endoscopic procedure.  Patient ID and intended procedure confirmed with present staff. Received instructions for my participation in the procedure from the performing physician.  

## 2014-05-24 ENCOUNTER — Telehealth: Payer: Self-pay | Admitting: *Deleted

## 2014-05-24 NOTE — Telephone Encounter (Signed)
Message left

## 2014-06-01 ENCOUNTER — Encounter: Payer: Self-pay | Admitting: Gastroenterology

## 2014-06-15 ENCOUNTER — Other Ambulatory Visit: Payer: Self-pay | Admitting: Internal Medicine

## 2014-06-15 ENCOUNTER — Other Ambulatory Visit: Payer: Self-pay | Admitting: Obstetrics & Gynecology

## 2014-06-16 LAB — CYTOLOGY - PAP

## 2014-07-06 ENCOUNTER — Encounter: Payer: Self-pay | Admitting: Gastroenterology

## 2014-07-12 ENCOUNTER — Encounter: Payer: Self-pay | Admitting: Internal Medicine

## 2014-07-12 ENCOUNTER — Ambulatory Visit (INDEPENDENT_AMBULATORY_CARE_PROVIDER_SITE_OTHER): Payer: BC Managed Care – PPO | Admitting: Internal Medicine

## 2014-07-12 VITALS — BP 126/78 | HR 78 | Temp 97.9°F | Resp 10 | Ht 62.5 in | Wt 173.0 lb

## 2014-07-12 DIAGNOSIS — K5900 Constipation, unspecified: Secondary | ICD-10-CM

## 2014-07-12 DIAGNOSIS — R21 Rash and other nonspecific skin eruption: Secondary | ICD-10-CM | POA: Insufficient documentation

## 2014-07-12 DIAGNOSIS — R7309 Other abnormal glucose: Secondary | ICD-10-CM

## 2014-07-12 DIAGNOSIS — R7303 Prediabetes: Secondary | ICD-10-CM | POA: Insufficient documentation

## 2014-07-12 DIAGNOSIS — I10 Essential (primary) hypertension: Secondary | ICD-10-CM

## 2014-07-12 DIAGNOSIS — E785 Hyperlipidemia, unspecified: Secondary | ICD-10-CM

## 2014-07-12 DIAGNOSIS — E559 Vitamin D deficiency, unspecified: Secondary | ICD-10-CM

## 2014-07-12 DIAGNOSIS — K219 Gastro-esophageal reflux disease without esophagitis: Secondary | ICD-10-CM | POA: Insufficient documentation

## 2014-07-12 MED ORDER — OMEPRAZOLE 20 MG PO CPDR
20.0000 mg | DELAYED_RELEASE_CAPSULE | Freq: Every day | ORAL | Status: DC
Start: 2014-07-12 — End: 2015-05-23

## 2014-07-12 MED ORDER — ZOSTER VACCINE LIVE 19400 UNT/0.65ML ~~LOC~~ SOLR: 0.6500 mL | Freq: Once | SUBCUTANEOUS | Status: DC

## 2014-07-12 MED ORDER — CALCIUM CARBONATE-VITAMIN D 500-200 MG-UNIT PO TABS: 1.0000 | ORAL_TABLET | Freq: Two times a day (BID) | ORAL | Status: AC

## 2014-07-12 NOTE — Progress Notes (Signed)
Patient ID: Mary Collier, female   DOB: [birth year], 63 y.o.   MRN: [id number]    Chief Complaint  Patient presents with  . Medical Management of Chronic Issues    3 month follow-up, no recent labs (not fasting)    Allergies  Allergen Reactions  . Influenza Vaccines     D/t being allergic to eggs  . Celecoxib     Able to take IBU  . Codeine Swelling  . Peanut-Containing Drug Products     Itching  . Prednisone     Rapid heart rate  . Prochlorperazine Edisylate     paralyzed  . Vioxx [Rofecoxib]     States able to take IBU  . Aspirin Rash    States able to take IBU  . Eggs Or Egg-Derived Products Rash  . Epinephrine Rash  . Erythromycin Rash  . Sulfa Antibiotics Rash  . Tetracycline Rash   HPI 63 y/o female patient is here for routine visit.  She mentions that she has no known allergy to peanuts but recently has developed rash and itching when she takes peanut and products containing peanut. She has also noticed that her hands start itching when she hold the newspaper to read and this is new. She mentions that she feels being allergic to ink She denies any other complaint this visit. She had dexa scan and mammogram this year at her gyn and result were normal as per pt. Reflux under control. Bowel movement better with her bowel regimen. Reviewed colonoscopy report.  She has history of HTN, gerd, hyperlipidemia, vitamin d deficiency   Review of Systems  Constitutional: Negative for fever, chills, weight loss, malaise/fatigue and diaphoresis.  HENT: Negative for congestion, hearing loss and sore throat.   Eyes: Negative for blurred vision, double vision and discharge.  Respiratory: Negative for cough, sputum production, shortness of breath and wheezing.   Cardiovascular: Negative for chest pain, palpitations, orthopnea. Has mild leg swelling.  Gastrointestinal: Negative for nausea, vomiting, diarrhea.   Genitourinary: Negative for dysuria, urgency, frequency and flank pain.    Musculoskeletal: Negative for back pain, falls.  Skin: Negative for itching and rash.  Neurological: Negative for dizziness, tingling, focal weakness and headaches.  Psychiatric/Behavioral: Negative for depression and memory loss. The patient is not nervous/anxious.    Wt Readings from Last 3 Encounters:  07/12/14 173 lb (78.472 kg)  05/23/14 173 lb (78.472 kg)  05/09/14 173 lb (78.472 kg)   Past Medical History  Diagnosis Date  . Cancer of kidney     at age 59.  Right kidney.  . Lung cancer     at age 14  Right lung.  . Cancer     at age 75.  abdominal  . Allergic rhinitis   . Chronic headache   . Hiatal hernia   . Internal hemorrhoid   . IBS (irritable bowel syndrome)   . GERD (gastroesophageal reflux disease)   . Peptic stricture of esophagus   . Asthma   . Reflux esophagitis   . Glaucoma   . Hypertension   . Unspecified vitamin D deficiency   . Other and unspecified hyperlipidemia   . Edema   . Other abnormal blood chemistry   . Lump or mass in breast   . Unspecified menopausal and postmenopausal disorder   . Lumbago   . Other convulsions     hx seizures at age 56  . Arthritis   . Allergy   . Blood transfusion without reported diagnosis  1966   Current Outpatient Prescriptions on File Prior to Visit  Medication Sig Dispense Refill  . acetaminophen (TYLENOL) 325 MG tablet Take 325 mg by mouth as needed.        Marland Kitchen atorvastatin (LIPITOR) 10 MG tablet Take 1 tablet (10 mg total) by mouth daily.  90 tablet  3  . bimatoprost (LUMIGAN) 0.03 % ophthalmic solution Place 1 drop into both eyes at bedtime.        . folic acid (FOLVITE) 1 MG tablet Take 1 mg by mouth daily.      . hydrocortisone (ANUSOL-HC) 25 MG suppository PLACE 1 SUPPOSITORY PER RECTUM TWICE A DAY  28 suppository  0  . metoprolol succinate (TOPROL-XL) 25 MG 24 hr tablet Take 1 tablet (25 mg total) by mouth daily.  90 tablet  3  . polyethylene glycol powder (GLYCOLAX/MIRALAX) powder Take 17 g by mouth  daily.  3350 g  1  . senna-docusate (SENOKOT-S) 8.6-50 MG per tablet Take 1 tablet by mouth daily.  30 tablet  3   Current Facility-Administered Medications on File Prior to Visit  Medication Dose Route Frequency Provider Last Rate Last Dose  . pneumococcal 13-valent conjugate vaccine (PREVNAR 13) injection 0.5 mL  0.5 mL Intramuscular Once Blanchie Serve, MD        Physical exam BP 126/78  Pulse 78  Temp(Src) 97.9 F (36.6 C) (Oral)  Resp 10  Ht 5' 2.5" (1.588 m)  Wt 173 lb (78.472 kg)  BMI 31.12 kg/m2  SpO2 96%  General- elderly female in no acute distress Head- atraumatic, normocephalic Eyes- PERRLA, EOMI, no pallor, no icterus, no discharge Cardiovascular- normal s1,s2, no murmurs Respiratory- bilateral clear to auscultation, no wheeze, no rhonchi, no crackles Abdomen- bowel sounds present, soft, non tender Musculoskeletal- able to move all 4 extremities, no spinal and paraspinal tenderness, steady gait, no use of assistive device, normal range of motion, trace leg edema Neurological- no focal deficit Skin- warm and dry Psychiatry- alert and oriented to person, place and time, normal mood and affect  Lab Results  Component Value Date   WBC 7.6 04/11/2014   HGB 12.3 04/11/2014   HCT 36.9 04/11/2014   MCV 87 04/11/2014   PLT 286 07/06/2007   CMP     Component Value Date/Time   NA 138 04/11/2014 1518   NA 135 07/06/2007 1040   K 4.7 04/11/2014 1518   CL 101 04/11/2014 1518   CO2 23 04/11/2014 1518   GLUCOSE 79 04/11/2014 1518   GLUCOSE 94 07/06/2007 1040   BUN 14 04/11/2014 1518   BUN 12 07/06/2007 1040   CREATININE 0.81 04/11/2014 1518   CALCIUM 9.5 04/11/2014 1518   PROT 7.4 04/11/2014 1518   PROT 7.1 07/06/2007 1040   ALBUMIN 3.7 07/06/2007 1040   AST 23 04/11/2014 1518   ALT 28 04/11/2014 1518   ALKPHOS 85 04/11/2014 1518   BILITOT <0.2 04/11/2014 1518   GFRNONAA 78 04/11/2014 1518   GFRAA 90 04/11/2014 1518   Lipid Panel     Component Value Date/Time   TRIG 137 04/11/2014  1518   HDL 46 04/11/2014 1518   CHOLHDL 3.3 04/11/2014 1518   LDLCALC 80 04/11/2014 1518   Assessment/plan  1. Essential hypertension Stable, continue toprol xl - CMP; Future - CBC with Differential; Future  2. Unspecified constipation Improved, continue senna s and miralax  3. Unspecified vitamin D deficiency Start on ca-vit d 500-200 bid, weight bearing exercise encouraged  4. Other and unspecified hyperlipidemia  Recheck flp next visit, continue lipitor - Lipid Panel; Future  5. Gastroesophageal reflux disease, esophagitis presence not specified Improved, weight loss encouraged, decrease prilosec to 20 mg daily, new script provided  6. Prediabetes Monitor a1c, also check yearly tsh - TSH; Future - Hemoglobin A1c  7. Rash and nonspecific skin eruption Unclear of etiology, no rash at present - Ambulatory referral to Allergy

## 2014-10-17 ENCOUNTER — Other Ambulatory Visit: Payer: Self-pay | Admitting: Internal Medicine

## 2014-12-12 ENCOUNTER — Encounter: Payer: Self-pay | Admitting: Internal Medicine

## 2015-01-08 ENCOUNTER — Other Ambulatory Visit: Payer: BLUE CROSS/BLUE SHIELD

## 2015-01-08 DIAGNOSIS — E785 Hyperlipidemia, unspecified: Secondary | ICD-10-CM

## 2015-01-08 DIAGNOSIS — I10 Essential (primary) hypertension: Secondary | ICD-10-CM

## 2015-01-08 DIAGNOSIS — R7303 Prediabetes: Secondary | ICD-10-CM

## 2015-01-09 LAB — CBC WITH DIFFERENTIAL/PLATELET
BASOS ABS: 0 10*3/uL (ref 0.0–0.2)
Basos: 0 %
Eos: 1 %
Eosinophils Absolute: 0.1 10*3/uL (ref 0.0–0.4)
HCT: 38.2 % (ref 34.0–46.6)
Hemoglobin: 12.5 g/dL (ref 11.1–15.9)
IMMATURE GRANS (ABS): 0 10*3/uL (ref 0.0–0.1)
Immature Granulocytes: 0 %
Lymphocytes Absolute: 1.7 10*3/uL (ref 0.7–3.1)
Lymphs: 22 %
MCH: 28.3 pg (ref 26.6–33.0)
MCHC: 32.7 g/dL (ref 31.5–35.7)
MCV: 86 fL (ref 79–97)
Monocytes Absolute: 0.7 10*3/uL (ref 0.1–0.9)
Monocytes: 10 %
Neutrophils Absolute: 5.3 10*3/uL (ref 1.4–7.0)
Neutrophils Relative %: 67 %
PLATELETS: 251 10*3/uL (ref 150–379)
RBC: 4.42 x10E6/uL (ref 3.77–5.28)
RDW: 13.6 % (ref 12.3–15.4)
WBC: 7.8 10*3/uL (ref 3.4–10.8)

## 2015-01-09 LAB — COMPREHENSIVE METABOLIC PANEL
ALBUMIN: 3.9 g/dL (ref 3.6–4.8)
ALT: 19 IU/L (ref 0–32)
AST: 16 IU/L (ref 0–40)
Albumin/Globulin Ratio: 1.2 (ref 1.1–2.5)
Alkaline Phosphatase: 95 IU/L (ref 39–117)
BUN/Creatinine Ratio: 17 (ref 11–26)
BUN: 14 mg/dL (ref 8–27)
Bilirubin Total: <0.2 mg/dL (ref 0.0–1.2)
CALCIUM: 9.3 mg/dL (ref 8.7–10.3)
CO2: 24 mmol/L (ref 18–29)
CREATININE: 0.82 mg/dL (ref 0.57–1.00)
Chloride: 103 mmol/L (ref 97–108)
GFR calc Af Amer: 88 mL/min/{1.73_m2} (ref >59)
GFR calc non Af Amer: 76 mL/min/{1.73_m2} (ref >59)
Globulin, Total: 3.2 g/dL (ref 1.5–4.5)
Glucose: 106 mg/dL — ABNORMAL HIGH (ref 65–99)
Potassium: 4.6 mmol/L (ref 3.5–5.2)
Sodium: 140 mmol/L (ref 134–144)
TOTAL PROTEIN: 7.1 g/dL (ref 6.0–8.5)

## 2015-01-09 LAB — LIPID PANEL
CHOLESTEROL TOTAL: 157 mg/dL (ref 100–199)
Chol/HDL Ratio: 3.6 ratio units (ref 0.0–4.4)
HDL: 44 mg/dL (ref >39)
LDL CALC: 84 mg/dL (ref 0–99)
Triglycerides: 143 mg/dL (ref 0–149)
VLDL CHOLESTEROL CAL: 29 mg/dL (ref 5–40)

## 2015-01-09 LAB — TSH: TSH: 4.18 u[IU]/mL (ref 0.450–4.500)

## 2015-01-10 ENCOUNTER — Encounter: Payer: BC Managed Care – PPO | Admitting: Internal Medicine

## 2015-01-30 ENCOUNTER — Encounter: Payer: BC Managed Care – PPO | Admitting: Internal Medicine

## 2015-02-01 ENCOUNTER — Telehealth: Payer: Self-pay | Admitting: Nurse Practitioner

## 2015-02-01 NOTE — Telephone Encounter (Signed)
Called patient and left her a message to please call me back. Called patient because our records indicate she is due for a mammogram and I was calling to get approval to get that scheduled

## 2015-02-15 ENCOUNTER — Encounter: Payer: Self-pay | Admitting: Nurse Practitioner

## 2015-03-26 ENCOUNTER — Telehealth: Payer: Self-pay | Admitting: Gastroenterology

## 2015-03-26 NOTE — Telephone Encounter (Signed)
Left message for patient to call back  

## 2015-03-29 NOTE — Telephone Encounter (Signed)
Left message for patient to call back  

## 2015-03-30 NOTE — Telephone Encounter (Signed)
Patient is having reflux.  She is advised to increase her PPI to BID x 2 weeks.  Patient instructed to maintain an anti-reflux diet. Advised to avoid caffeine, mint, citrus foods/juices, tomatoes,  chocolate, NSAIDS/ASA products.  Instructed not to eat within 2 hours of exercise or bed, multiple small meals are better than 3 large meals.  Need to take PPI 30 minutes prior to 1st meal of the day and the other at HS.  She is advised that she should call me back if this does not help and will see if she needs to see an APP.

## 2015-04-03 ENCOUNTER — Encounter: Payer: Self-pay | Admitting: Nurse Practitioner

## 2015-04-03 ENCOUNTER — Ambulatory Visit (INDEPENDENT_AMBULATORY_CARE_PROVIDER_SITE_OTHER): Payer: BLUE CROSS/BLUE SHIELD | Admitting: Nurse Practitioner

## 2015-04-03 VITALS — BP 148/96 | HR 66 | Temp 97.9°F | Resp 20 | Ht 63.0 in | Wt 175.0 lb

## 2015-04-03 DIAGNOSIS — Z0000 Encounter for general adult medical examination without abnormal findings: Secondary | ICD-10-CM

## 2015-04-03 DIAGNOSIS — H9191 Unspecified hearing loss, right ear: Secondary | ICD-10-CM | POA: Diagnosis not present

## 2015-04-03 DIAGNOSIS — R739 Hyperglycemia, unspecified: Secondary | ICD-10-CM

## 2015-04-03 MED ORDER — TETANUS-DIPHTH-ACELL PERTUSSIS 5-2-15.5 LF-MCG/0.5 IM SUSP: 0.5000 mL | Freq: Once | INTRAMUSCULAR | Status: DC

## 2015-04-03 NOTE — Patient Instructions (Addendum)
Watch your sugar intake, glucose high on last labs,will monitor this 30 mins of cardiovascular activity 5 days a week  To start heart healthy diet, avoid sweets and to control the amount of carbohydrates you eat (starchy foods such as flour, bread, and potatoes), avoid high fat dairy (like ice cream, whole milk, cheeses), can buy a heart healthy cook book from the american [employer] association to help with meal planning.    Health Maintenance Adopting a healthy lifestyle and getting preventive care can go a long way to promote health and wellness. Talk with your health care provider about what schedule of regular examinations is right for you. This is a good chance for you to check in with your provider about disease prevention and staying healthy. In between checkups, there are plenty of things you can do on your own. Experts have done a lot of research about which lifestyle changes and preventive measures are most likely to keep you healthy. Ask your health care provider for more information. WEIGHT AND DIET  Eat a healthy diet  Be sure to include plenty of vegetables, fruits, low-fat dairy products, and lean protein.  Do not eat a lot of foods high in solid fats, added sugars, or salt.  Get regular exercise. This is one of the most important things you can do for your health.  Most adults should exercise for at least 150 minutes each week. The exercise should increase your heart rate and make you sweat (moderate-intensity exercise).  Most adults should also do strengthening exercises at least twice a week. This is in addition to the moderate-intensity exercise.  Maintain a healthy weight  Body mass index (BMI) is a measurement that can be used to identify possible weight problems. It estimates body fat based on height and weight. Your health care provider can help determine your BMI and help you achieve or maintain a healthy weight.  For females 68 years of age and older:   A BMI below  18.5 is considered underweight.  A BMI of 18.5 to 24.9 is normal.  A BMI of 25 to 29.9 is considered overweight.  A BMI of 30 and above is considered obese.  Watch levels of cholesterol and blood lipids  You should start having your blood tested for lipids and cholesterol at 63 years of age, then have this test every 5 years.  You may need to have your cholesterol levels checked more often if:  Your lipid or cholesterol levels are high.  You are older than 63 years of age.  You are at high risk for heart disease.  CANCER SCREENING   Lung Cancer  Lung cancer screening is recommended for adults 65-19 years old who are at high risk for lung cancer because of a history of smoking.  A yearly low-dose CT scan of the lungs is recommended for people who:  Currently smoke.  Have quit within the past 15 years.  Have at least a 30-pack-year history of smoking. A pack year is smoking an average of one pack of cigarettes a day for 1 year.  Yearly screening should continue until it has been 15 years since you quit.  Yearly screening should stop if you develop a health problem that would prevent you from having lung cancer treatment.  Breast Cancer  Practice breast self-awareness. This means understanding how your breasts normally appear and feel.  It also means doing regular breast self-exams. Let your health care provider know about any changes, no matter how small.  If you are in your 20s or 30s, you should have a clinical breast exam (CBE) by a health care provider every 1-3 years as part of a regular health exam.  If you are 90 or older, have a CBE every year. Also consider having a breast X-ray (mammogram) every year.  If you have a family history of breast cancer, talk to your health care provider about genetic screening.  If you are at high risk for breast cancer, talk to your health care provider about having an MRI and a mammogram every year.  Breast cancer gene (BRCA)  assessment is recommended for women who have family members with BRCA-related cancers. BRCA-related cancers include:  Breast.  Ovarian.  Tubal.  Peritoneal cancers.  Results of the assessment will determine the need for genetic counseling and BRCA1 and BRCA2 testing. Cervical Cancer Routine pelvic examinations to screen for cervical cancer are no longer recommended for nonpregnant women who are considered low risk for cancer of the pelvic organs (ovaries, uterus, and vagina) and who do not have symptoms. A pelvic examination may be necessary if you have symptoms including those associated with pelvic infections. Ask your health care provider if a screening pelvic exam is right for you.   The Pap test is the screening test for cervical cancer for women who are considered at risk.  If you had a hysterectomy for a problem that was not cancer or a condition that could lead to cancer, then you no longer need Pap tests.  If you are older than 65 years, and you have had normal Pap tests for the past 10 years, you no longer need to have Pap tests.  If you have had past treatment for cervical cancer or a condition that could lead to cancer, you need Pap tests and screening for cancer for at least 20 years after your treatment.  If you no longer get a Pap test, assess your risk factors if they change (such as having a new sexual partner). This can affect whether you should start being screened again.  Some women have medical problems that increase their chance of getting cervical cancer. If this is the case for you, your health care provider may recommend more frequent screening and Pap tests.  The human papillomavirus (HPV) test is another test that may be used for cervical cancer screening. The HPV test looks for the virus that can cause cell changes in the cervix. The cells collected during the Pap test can be tested for HPV.  The HPV test can be used to screen women 42 years of age and older.  Getting tested for HPV can extend the interval between normal Pap tests from three to five years.  An HPV test also should be used to screen women of any age who have unclear Pap test results.  After 63 years of age, women should have HPV testing as often as Pap tests.  Colorectal Cancer  This type of cancer can be detected and often prevented.  Routine colorectal cancer screening usually begins at 63 years of age and continues through 63 years of age.  Your health care provider may recommend screening at an earlier age if you have risk factors for colon cancer.  Your health care provider may also recommend using home test kits to check for hidden blood in the stool.  A small camera at the end of a tube can be used to examine your colon directly (sigmoidoscopy or colonoscopy). This is done to check for  the earliest forms of colorectal cancer.  Routine screening usually begins at age 71.  Direct examination of the colon should be repeated every 5-10 years through 63 years of age. However, you may need to be screened more often if early forms of precancerous polyps or small growths are found. Skin Cancer  Check your skin from head to toe regularly.  Tell your health care provider about any new moles or changes in moles, especially if there is a change in a mole's shape or color.  Also tell your health care provider if you have a mole that is larger than the size of a pencil eraser.  Always use sunscreen. Apply sunscreen liberally and repeatedly throughout the day.  Protect yourself by wearing long sleeves, pants, a wide-brimmed hat, and sunglasses whenever you are outside. HEART DISEASE, DIABETES, AND HIGH BLOOD PRESSURE   Have your blood pressure checked at least every 1-2 years. High blood pressure causes heart disease and increases the risk of stroke.  If you are between 84 years and 71 years old, ask your health care provider if you should take aspirin to prevent  strokes.  Have regular diabetes screenings. This involves taking a blood sample to check your fasting blood sugar level.  If you are at a normal weight and have a low risk for diabetes, have this test once every three years after 63 years of age.  If you are overweight and have a high risk for diabetes, consider being tested at a younger age or more often. PREVENTING INFECTION  Hepatitis B  If you have a higher risk for hepatitis B, you should be screened for this virus. You are considered at high risk for hepatitis B if:  You were born in a country where hepatitis B is common. Ask your health care provider which countries are considered high risk.  Your parents were born in a high-risk country, and you have not been immunized against hepatitis B (hepatitis B vaccine).  You have HIV or AIDS.  You use needles to inject street drugs.  You live with someone who has hepatitis B.  You have had sex with someone who has hepatitis B.  You get hemodialysis treatment.  You take certain medicines for conditions, including cancer, organ transplantation, and autoimmune conditions. Hepatitis C  Blood testing is recommended for:  Everyone born from [birth year] through 1964.  Anyone with known risk factors for hepatitis C. Sexually transmitted infections (STIs)  You should be screened for sexually transmitted infections (STIs) including gonorrhea and chlamydia if:  You are sexually active and are younger than 63 years of age.  You are older than 63 years of age and your health care provider tells you that you are at risk for this type of infection.  Your sexual activity has changed since you were last screened and you are at an increased risk for chlamydia or gonorrhea. Ask your health care provider if you are at risk.  If you do not have HIV, but are at risk, it may be recommended that you take a prescription medicine daily to prevent HIV infection. This is called pre-exposure prophylaxis  (PrEP). You are considered at risk if:  You are sexually active and do not regularly use condoms or know the HIV status of your partner(s).  You take drugs by injection.  You are sexually active with a partner who has HIV. Talk with your health care provider about whether you are at high risk of being infected with HIV. If you  choose to begin PrEP, you should first be tested for HIV. You should then be tested every 3 months for as long as you are taking PrEP.  PREGNANCY   If you are premenopausal and you may become pregnant, ask your health care provider about preconception counseling.  If you may become pregnant, take 400 to 800 micrograms (mcg) of folic acid every day.  If you want to prevent pregnancy, talk to your health care provider about birth control (contraception). OSTEOPOROSIS AND MENOPAUSE   Osteoporosis is a disease in which the bones lose minerals and strength with aging. This can result in serious bone fractures. Your risk for osteoporosis can be identified using a bone density scan.  If you are 31 years of age or older, or if you are at risk for osteoporosis and fractures, ask your health care provider if you should be screened.  Ask your health care provider whether you should take a calcium or vitamin D supplement to lower your risk for osteoporosis.  Menopause may have certain physical symptoms and risks.  Hormone replacement therapy may reduce some of these symptoms and risks. Talk to your health care provider about whether hormone replacement therapy is right for you.  HOME CARE INSTRUCTIONS   Schedule regular health, dental, and eye exams.  Stay current with your immunizations.   Do not use any tobacco products including cigarettes, chewing tobacco, or electronic cigarettes.  If you are pregnant, do not drink alcohol.  If you are breastfeeding, limit how much and how often you drink alcohol.  Limit alcohol intake to no more than 1 drink per day for  nonpregnant women. One drink equals 12 ounces of beer, 5 ounces of wine, or 1 ounces of hard liquor.  Do not use street drugs.  Do not share needles.  Ask your health care provider for help if you need support or information about quitting drugs.  Tell your health care provider if you often feel depressed.  Tell your health care provider if you have ever been abused or do not feel safe at home. Document Released: 04/21/2011 Document Revised: 02/20/2014 Document Reviewed: 09/07/2013 [facility] Patient Information 2015 [place], [place]. This information is not intended to replace advice given to you by your health care provider. Make sure you discuss any questions you have with your health care provider.

## 2015-04-03 NOTE — Progress Notes (Signed)
Patient ID: Mary Collier, female   DOB: 13-May-1951, 64 y.o.   MRN: [id number]    PCP: Lauree Chandler, NP  Allergies  Allergen Reactions  . Influenza Vaccines     D/t being allergic to eggs  . Celecoxib     Able to take IBU  . Codeine Swelling  . Peanut-Containing Drug Products     Itching  . Prednisone     Rapid heart rate  . Prochlorperazine Edisylate     paralyzed  . Vioxx [Rofecoxib]     States able to take IBU  . Aspirin Rash    States able to take IBU  . Eggs Or Egg-Derived Products Rash  . Epinephrine Rash  . Erythromycin Rash  . Sulfa Antibiotics Rash  . Tetracycline Rash    Chief Complaint  Patient presents with  . Annual Exam    Annual exam     HPI: Patient is a 64 y.o. female seen in the office today for annual exam. No acute concerns today  Screenings: Colon Cancer- done in 2015, next due in 2020 Breast Cancer-plans to get mammogram aug 2016 Cervical Cancer- last PAP was 06/17/2014 Osteoporosis- Dexa Scan done at her GYN, last 2015  Vaccines Up to date on:  Influenza,  pneumococcal  Need:  Tdap  Smoking status: nonsmoker Alcohol use: nondrinker  Dentist: every 6 months-12 months Ophthalmologist: yearly  Exercise regimen: attempts to walk some Diet: portion control  Advanced Directive information Does patient have an advance directive?: No, Would patient like information on creating an advanced directive?: Yes - Educational materials given Review of Systems:  Review of Systems  Constitutional: Negative for activity change, appetite change, fatigue and unexpected weight change.  HENT: Positive for hearing loss. Negative for congestion.   Eyes: Negative.   Respiratory: Negative for cough and shortness of breath.   Cardiovascular: Negative for chest pain, palpitations and leg swelling.  Gastrointestinal: Negative for abdominal pain, diarrhea and constipation.  Genitourinary: Negative for dysuria and difficulty urinating.  Musculoskeletal:  Negative for myalgias and arthralgias.  Skin: Negative for color change and wound.  Neurological: Negative for dizziness and weakness.  Psychiatric/Behavioral: Negative for behavioral problems, confusion and agitation.    Past Medical History  Diagnosis Date  . Cancer of kidney     at age 53.  Right kidney.  . Lung cancer     at age 71  Right lung.  . Cancer     at age 53.  abdominal  . Allergic rhinitis   . Chronic headache   . Hiatal hernia   . Internal hemorrhoid   . IBS (irritable bowel syndrome)   . GERD (gastroesophageal reflux disease)   . Peptic stricture of esophagus   . Asthma   . Reflux esophagitis   . Glaucoma   . Hypertension   . Unspecified vitamin D deficiency   . Other and unspecified hyperlipidemia   . Edema   . Other abnormal blood chemistry   . Lump or mass in breast   . Unspecified menopausal and postmenopausal disorder   . Lumbago   . Other convulsions     hx seizures at age 52  . Arthritis   . Allergy   . Blood transfusion without reported diagnosis     1966   Past Surgical History  Procedure Laterality Date  . Lung surgery  at age 53    Right  . Nasal sinus surgery  1980s  . Total abdominal hysterectomy  2000s  . Breast  surgery  1970s and 1980s    Bilateral cysts removed  . Kidney surgery  at age 32    Right  . Abdominal surgery  at age 46   . Tonsillectomy  1970   Social History:   reports that she has never smoked. She has never used smokeless tobacco. She reports that she does not drink alcohol or use illicit drugs.  Family History  Problem Relation Age of Onset  . Allergies Sister     x3  . Asthma Sister   . Heart disease Other     2 siblings  . Bone cancer Father   . Lung cancer Father   . Cancer Father   . Breast cancer Mother   . Cancer Mother   . Cancer Brother   . Hypertension Brother   . Diabetes Brother   . Heart disease Sister   . Diabetes Sister   . Hypertension Sister   . Colon cancer Neg Hx      Medications: Patient's Medications  New Prescriptions   No medications on file  Previous Medications   ACETAMINOPHEN (TYLENOL) 325 MG TABLET    Take 325 mg by mouth as needed.     ATORVASTATIN (LIPITOR) 10 MG TABLET    Take 1 tablet (10 mg total) by mouth daily.   BIMATOPROST (LUMIGAN) 0.03 % OPHTHALMIC SOLUTION    Place 1 drop into both eyes at bedtime.     CALCIUM-VITAMIN D (OSCAL 500/200 D-3) 500-200 MG-UNIT PER TABLET    Take 1 tablet by mouth 2 (two) times daily.   FOLIC ACID (FOLVITE) 1 MG TABLET    Take 1 mg by mouth daily.   HYDROCORTISONE (ANUSOL-HC) 25 MG SUPPOSITORY    PLACE 1 SUPPOSITORY PER RECTUM TWICE A DAY   METOPROLOL SUCCINATE (TOPROL-XL) 25 MG 24 HR TABLET    Take 1 tablet (25 mg total) by mouth daily.   OMEPRAZOLE (PRILOSEC) 20 MG CAPSULE    Take 1 capsule (20 mg total) by mouth daily.   POLYETHYLENE GLYCOL POWDER (GLYCOLAX/MIRALAX) POWDER    Take 17 g by mouth daily.   SENNA PLUS 8.6-50 MG PER TABLET    TAKE 1 TABLET BY MOUTH DAILY.   ZOSTER VACCINE LIVE, PF, (ZOSTAVAX) 40102 UNT/0.65ML INJECTION    Inject 19,400 Units into the skin once.  Modified Medications   No medications on file  Discontinued Medications   METOPROLOL SUCCINATE (TOPROL-XL) 25 MG 24 HR TABLET    TAKE 1 TABLET BY MOUTH DAILY     Physical Exam:  Filed Vitals:   04/03/15 1124  BP: 148/96  Pulse: 66  Temp: 97.9 F (36.6 C)  TempSrc: Oral  Resp: 20  Height: '5\' 3"'$  (1.6 m)  Weight: 175 lb (79.379 kg)  SpO2: 97%    Physical Exam  Constitutional: She is oriented to person, place, and time. She appears well-developed and well-nourished. No distress.  HENT:  Head: Normocephalic and atraumatic.  Right Ear: External ear normal.  Left Ear: External ear normal.  Nose: Nose normal.  Mouth/Throat: Oropharynx is clear and moist. No oropharyngeal exudate.  Eyes: Conjunctivae and EOM are normal. Pupils are equal, round, and reactive to light.  Neck: Normal range of motion. Neck supple.   Cardiovascular: Normal rate, regular rhythm, normal heart sounds and intact distal pulses.   Pulmonary/Chest: Effort normal and breath sounds normal.  Abdominal: Soft. Bowel sounds are normal. She exhibits no distension. There is no tenderness.  Musculoskeletal: Normal range of motion. She exhibits no edema  or tenderness.  Neurological: She is alert and oriented to person, place, and time. She has normal reflexes.  Skin: Skin is warm and dry. She is not diaphoretic.  Psychiatric: She has a normal mood and affect. Her behavior is normal. Judgment and thought content normal.    Labs reviewed: Basic Metabolic Panel:  Recent Labs  04/11/14 1518 01/08/15 0829  NA 138 140  K 4.7 4.6  CL 101 103  CO2 23 24  GLUCOSE 79 106*  BUN 14 14  CREATININE 0.81 0.82  CALCIUM 9.5 9.3  TSH 2.360 4.180   Liver Function Tests:  Recent Labs  04/11/14 1518 01/08/15 0829  AST 23 16  ALT 28 19  ALKPHOS 85 95  BILITOT <0.2 <0.2  PROT 7.4 7.1   No results for input(s): LIPASE, AMYLASE in the last 8760 hours. No results for input(s): AMMONIA in the last 8760 hours. CBC:  Recent Labs  04/11/14 1518 01/08/15 0829  WBC 7.6 7.8  NEUTROABS 4.6 5.3  HGB 12.3 12.5  HCT 36.9 38.2  MCV 87 86  PLT  --  251   Lipid Panel:  Recent Labs  04/11/14 1518 01/08/15 0829  CHOL 153 157  HDL 46 44  LDLCALC 80 84  TRIG 137 143  CHOLHDL 3.3 3.6   TSH:  Recent Labs  04/11/14 1518 01/08/15 0829  TSH 2.360 4.180   A1C: Lab Results  Component Value Date   HGBA1C 6.1* 04/11/2014     Assessment/Plan 1. Preventative health care The patient is doing well and no distinct problems were identified on exam. Discussed diet and exercise at length also given preventive counseling   PREVENTIVE COUNSELING:  The patient was counseled regarding the appropriate use of alcohol, regular self-examination of the breasts on a monthly basis, prevention of dental and periodontal disease, diet, regular  sustained exercise for at least 30 minutes 5 times per week, routine screening interval for mammogram as recommended by the [place] and ACOG, importance of regular PAP smears,  and recommended schedule for GI hemoccult testing, colonoscopy, cholesterol, thyroid and diabetes screening.  Rx given for TDAP vaccine   2. Hearing loss, right -noted hearing loss,  Ambulatory referral to Audiology for further evaluation   3. Hyperglycemia Noted on lab work, will follow up fasting BMP prior to next appt, discussed diet and exercise

## 2015-04-11 ENCOUNTER — Other Ambulatory Visit: Payer: Self-pay | Admitting: Obstetrics & Gynecology

## 2015-04-11 ENCOUNTER — Ambulatory Visit
Admission: RE | Admit: 2015-04-11 | Discharge: 2015-04-11 | Disposition: A | Discharge status: Home / Self Care | Payer: BLUE CROSS/BLUE SHIELD | Source: Ambulatory Visit | Attending: Obstetrics & Gynecology | Admitting: Obstetrics & Gynecology

## 2015-04-11 DIAGNOSIS — N644 Mastodynia: Secondary | ICD-10-CM

## 2015-04-14 ENCOUNTER — Emergency Department (HOSPITAL_COMMUNITY)
Admission: EM | Admit: 2015-04-14 | Discharge: 2015-04-14 | Disposition: A | Discharge status: Home / Self Care | Payer: BLUE CROSS/BLUE SHIELD | Source: Home / Self Care | Attending: Family Medicine | Admitting: Family Medicine

## 2015-04-14 ENCOUNTER — Encounter (HOSPITAL_COMMUNITY): Payer: Self-pay | Admitting: Emergency Medicine

## 2015-04-14 ENCOUNTER — Other Ambulatory Visit: Payer: Self-pay | Admitting: Internal Medicine

## 2015-04-14 DIAGNOSIS — J40 Bronchitis, not specified as acute or chronic: Secondary | ICD-10-CM

## 2015-04-14 MED ORDER — MOMETASONE FUROATE 50 MCG/ACT NA SUSP: 2.0000 | Freq: Every day | NASAL | Status: DC

## 2015-04-14 MED ORDER — BENZONATATE 100 MG PO CAPS
100.0000 mg | ORAL_CAPSULE | Freq: Three times a day (TID) | ORAL | Status: DC
Start: 2015-04-14 — End: 2015-05-23

## 2015-04-14 MED ORDER — ALBUTEROL SULFATE HFA 108 (90 BASE) MCG/ACT IN AERS: 2.0000 | INHALATION_SPRAY | Freq: Four times a day (QID) | RESPIRATORY_TRACT | Status: DC | PRN

## 2015-04-14 MED ORDER — ALBUTEROL SULFATE (2.5 MG/3ML) 0.083% IN NEBU
2.5000 mg | INHALATION_SOLUTION | Freq: Once | RESPIRATORY_TRACT | Status: AC
  Administered 2015-04-14: 2.5 mg via RESPIRATORY_TRACT

## 2015-04-14 MED ORDER — ALBUTEROL SULFATE (2.5 MG/3ML) 0.083% IN NEBU
INHALATION_SOLUTION | RESPIRATORY_TRACT | Status: AC
  Filled 2015-04-14: qty 3

## 2015-04-14 NOTE — Discharge Instructions (Signed)
Thank you for coming in today. Take tylenol for pain and fever.  Use tessalon for cough.  Use the nasal spray.  USe the albuterol as needed for wheezing, shortness of breath, or cough.  Follow up with your doctor or return if not better.  Call or go to the emergency room if you get worse, have trouble breathing, have chest pains, or palpitations.   Acute Bronchitis Bronchitis is inflammation of the airways that extend from the windpipe into the lungs (bronchi). The inflammation often causes mucus to develop. This leads to a cough, which is the most common symptom of bronchitis.  In acute bronchitis, the condition usually develops suddenly and goes away over time, usually in a couple weeks. Smoking, allergies, and asthma can make bronchitis worse. Repeated episodes of bronchitis may cause further lung problems.  CAUSES Acute bronchitis is most often caused by the same virus that causes a cold. The virus can spread from person to person (contagious) through coughing, sneezing, and touching contaminated objects. SIGNS AND SYMPTOMS  1. Cough.  2. Fever.  3. Coughing up mucus.  4. Body aches.  5. Chest congestion.  6. Chills.  7. Shortness of breath.  8. Sore throat.  DIAGNOSIS  Acute bronchitis is usually diagnosed through a physical exam. Your health care provider will also ask you questions about your medical history. Tests, such as chest X-rays, are sometimes done to rule out other conditions.  TREATMENT  Acute bronchitis usually goes away in a couple weeks. Oftentimes, no medical treatment is necessary. Medicines are sometimes given for relief of fever or cough. Antibiotic medicines are usually not needed but may be prescribed in certain situations. In some cases, an inhaler may be recommended to help reduce shortness of breath and control the cough. A cool mist vaporizer may also be used to help thin bronchial secretions and make it easier to clear the chest.  HOME CARE  INSTRUCTIONS 1. Get plenty of rest.  2. Drink enough fluids to keep your urine clear or pale yellow (unless you have a medical condition that requires fluid restriction). Increasing fluids may help thin your respiratory secretions (sputum) and reduce chest congestion, and it will prevent dehydration.  3. Take medicines only as directed by your health care provider. 4. If you were prescribed an antibiotic medicine, finish it all even if you start to feel better. 5. Avoid smoking and secondhand smoke. Exposure to cigarette smoke or irritating chemicals will make bronchitis worse. If you are a smoker, consider using nicotine gum or skin patches to help control withdrawal symptoms. Quitting smoking will help your lungs heal faster.  6. Reduce the chances of another bout of acute bronchitis by washing your hands frequently, avoiding people with cold symptoms, and trying not to touch your hands to your mouth, nose, or eyes.  7. Keep all follow-up visits as directed by your health care provider.  SEEK MEDICAL CARE IF: Your symptoms do not improve after 1 week of treatment.  SEEK IMMEDIATE MEDICAL CARE IF:  You develop an increased fever or chills.   You have chest pain.   You have severe shortness of breath.  You have bloody sputum.   You develop dehydration.  You faint or repeatedly feel like you are going to pass out.  You develop repeated vomiting.  You develop a severe headache. MAKE SURE YOU:   Understand these instructions.  Will watch your condition.  Will get help right away if you are not doing well or get worse.  Document Released: 11/13/2004 Document Revised: 02/20/2014 Document Reviewed: 03/29/2013 [facility] Patient Information 2015 [place], [place]. This information is not intended to replace advice given to you by your health care provider. Make sure you discuss any questions you have with your health care provider.    How to Use an Inhaler Proper inhaler technique  is very important. Good technique ensures that the medicine reaches the lungs. Poor technique results in depositing the medicine on the tongue and back of the throat rather than in the airways. If you do not use the inhaler with good technique, the medicine will not help you. STEPS TO FOLLOW IF USING AN INHALER WITHOUT AN EXTENSION TUBE 9. Remove the cap from the inhaler. 10. If you are using the inhaler for the first time, you will need to prime it. Shake the inhaler for 5 seconds and release four puffs into the air, away from your face. Ask your health care provider or pharmacist if you have questions about priming your inhaler. 11. Shake the inhaler for 5 seconds before each breath in (inhalation). 12. Position the inhaler so that the top of the canister faces up. 13. Put your index finger on the top of the medicine canister. Your thumb supports the bottom of the inhaler. 14. Open your mouth. 15. Either place the inhaler between your teeth and place your lips tightly around the mouthpiece, or hold the inhaler 1-2 inches away from your open mouth. If you are unsure of which technique to use, ask your health care provider. 16. Breathe out (exhale) normally and as completely as possible. 17. Press the canister down with your index finger to release the medicine. 18. At the same time as the canister is pressed, inhale deeply and slowly until your lungs are completely filled. This should take 4-6 seconds. Keep your tongue down. 19. Hold the medicine in your lungs for 5-10 seconds (10 seconds is best). This helps the medicine get into the small airways of your lungs. 20. Breathe out slowly, through pursed lips. Whistling is an example of pursed lips. 21. Wait at least 15-30 seconds between puffs. Continue with the above steps until you have taken the number of puffs your health care provider has ordered. Do not use the inhaler more than your health care provider tells you. 22. Replace the cap on the  inhaler. 23. Follow the directions from your health care provider or the inhaler insert for cleaning the inhaler. STEPS TO FOLLOW IF USING AN INHALER WITH AN EXTENSION (SPACER) 8. Remove the cap from the inhaler. 9. If you are using the inhaler for the first time, you will need to prime it. Shake the inhaler for 5 seconds and release four puffs into the air, away from your face. Ask your health care provider or pharmacist if you have questions about priming your inhaler. 10. Shake the inhaler for 5 seconds before each breath in (inhalation). 11. Place the open end of the spacer onto the mouthpiece of the inhaler. 12. Position the inhaler so that the top of the canister faces up and the spacer mouthpiece faces you. 13. Put your index finger on the top of the medicine canister. Your thumb supports the bottom of the inhaler and the spacer. 14. Breathe out (exhale) normally and as completely as possible. 15. Immediately after exhaling, place the spacer between your teeth and into your mouth. Close your lips tightly around the spacer. 16. Press the canister down with your index finger to release the medicine. 17. At the  same time as the canister is pressed, inhale deeply and slowly until your lungs are completely filled. This should take 4-6 seconds. Keep your tongue down and out of the way. 18. Hold the medicine in your lungs for 5-10 seconds (10 seconds is best). This helps the medicine get into the small airways of your lungs. Exhale. 19. Repeat inhaling deeply through the spacer mouthpiece. Again hold that breath for up to 10 seconds (10 seconds is best). Exhale slowly. If it is difficult to take this second deep breath through the spacer, breathe normally several times through the spacer. Remove the spacer from your mouth. 20. Wait at least 15-30 seconds between puffs. Continue with the above steps until you have taken the number of puffs your health care provider has ordered. Do not use the inhaler  more than your health care provider tells you. 21. Remove the spacer from the inhaler, and place the cap on the inhaler. 22. Follow the directions from your health care provider or the inhaler insert for cleaning the inhaler and spacer. If you are using different kinds of inhalers, use your quick relief medicine to open the airways 10-15 minutes before using a steroid if instructed to do so by your health care provider. If you are unsure which inhalers to use and the order of using them, ask your health care provider, nurse, or respiratory therapist. If you are using a steroid inhaler, always rinse your mouth with water after your last puff, then gargle and spit out the water. Do not swallow the water. AVOID:  Inhaling before or after starting the spray of medicine. It takes practice to coordinate your breathing with triggering the spray.  Inhaling through the nose (rather than the mouth) when triggering the spray. HOW TO DETERMINE IF YOUR INHALER IS FULL OR NEARLY EMPTY You cannot know when an inhaler is empty by shaking it. A few inhalers are now being made with dose counters. Ask your health care provider for a prescription that has a dose counter if you feel you need that extra help. If your inhaler does not have a counter, ask your health care provider to help you determine the date you need to refill your inhaler. Write the refill date on a calendar or your inhaler canister. Refill your inhaler 7-10 days before it runs out. Be sure to keep an adequate supply of medicine. This includes making sure it is not expired, and that you have a spare inhaler.  SEEK MEDICAL CARE IF:   Your symptoms are only partially relieved with your inhaler.  You are having trouble using your inhaler.  You have some increase in phlegm. SEEK IMMEDIATE MEDICAL CARE IF:   You feel little or no relief with your inhalers. You are still wheezing and are feeling shortness of breath or tightness in your chest or  both.  You have dizziness, headaches, or a fast heart rate.  You have chills, fever, or night sweats.  You have a noticeable increase in phlegm production, or there is blood in the phlegm. MAKE SURE YOU:   Understand these instructions.  Will watch your condition.  Will get help right away if you are not doing well or get worse. Document Released: 10/03/2000 Document Revised: 07/27/2013 Document Reviewed: 05/05/2013 [facility] Patient Information 2015 [place], [place]. This information is not intended to replace advice given to you by your health care provider. Make sure you discuss any questions you have with your health care provider.

## 2015-04-14 NOTE — ED Notes (Signed)
C/o cold sx States she has a non productive cough, right ear pain, congestion, and has stinging in nose States throat hurts due to coughing

## 2015-04-14 NOTE — ED Provider Notes (Signed)
Mary Collier is a 64 y.o. female who presents to Urgent Care today for sinus pressure cough congestion runny nose right ear pain and sore throat. No vomiting or diarrhea. No chest pains or palpitations. She notes mild cough and wheezing.   Past Medical History  Diagnosis Date  . Cancer of kidney     at age 88.  Right kidney.  . Lung cancer     at age 89  Right lung.  . Cancer     at age 52.  abdominal  . Allergic rhinitis   . Chronic headache   . Hiatal hernia   . Internal hemorrhoid   . IBS (irritable bowel syndrome)   . GERD (gastroesophageal reflux disease)   . Peptic stricture of esophagus   . Asthma   . Reflux esophagitis   . Glaucoma   . Hypertension   . Unspecified vitamin D deficiency   . Other and unspecified hyperlipidemia   . Edema   . Other abnormal blood chemistry   . Lump or mass in breast   . Unspecified menopausal and postmenopausal disorder   . Lumbago   . Other convulsions     hx seizures at age 30  . Arthritis   . Allergy   . Blood transfusion without reported diagnosis     1966   Past Surgical History  Procedure Laterality Date  . Lung surgery  at age 55    Right  . Nasal sinus surgery  1980s  . Total abdominal hysterectomy  2000s  . Breast surgery  1970s and 1980s    Bilateral cysts removed  . Kidney surgery  at age 89    Right  . Abdominal surgery  at age 84   . Tonsillectomy  1970   History  Substance Use Topics  . Smoking status: Never Smoker   . Smokeless tobacco: Never Used  . Alcohol Use: No   ROS as above Medications: No current facility-administered medications for this encounter.   Current Outpatient Prescriptions  Medication Sig Dispense Refill  . acetaminophen (TYLENOL) 325 MG tablet Take 325 mg by mouth as needed.      Marland Kitchen albuterol (PROVENTIL HFA;VENTOLIN HFA) 108 (90 BASE) MCG/ACT inhaler Inhale 2 puffs into the lungs every 6 (six) hours as needed for wheezing or shortness of breath. 1 Inhaler 2  . atorvastatin (LIPITOR)  10 MG tablet Take 1 tablet (10 mg total) by mouth daily. 90 tablet 3  . benzonatate (TESSALON) 100 MG capsule Take 1 capsule (100 mg total) by mouth every 8 (eight) hours. 21 capsule 0  . bimatoprost (LUMIGAN) 0.03 % ophthalmic solution Place 1 drop into both eyes at bedtime.      . calcium-vitamin D (OSCAL 500/200 D-3) 500-200 MG-UNIT per tablet Take 1 tablet by mouth 2 (two) times daily. 60 tablet 12  . folic acid (FOLVITE) 1 MG tablet Take 1 mg by mouth daily.    . hydrocortisone (ANUSOL-HC) 25 MG suppository PLACE 1 SUPPOSITORY PER RECTUM TWICE A DAY 28 suppository 0  . metoprolol succinate (TOPROL-XL) 25 MG 24 hr tablet Take 1 tablet (25 mg total) by mouth daily. 90 tablet 3  . mometasone (NASONEX) 50 MCG/ACT nasal spray Place 2 sprays into the nose daily. 17 g 12  . omeprazole (PRILOSEC) 20 MG capsule Take 1 capsule (20 mg total) by mouth daily. 90 capsule 3  . polyethylene glycol powder (GLYCOLAX/MIRALAX) powder Take 17 g by mouth daily. 3350 g 1  . SENNA PLUS 8.6-50 MG  per tablet TAKE 1 TABLET BY MOUTH DAILY. 90 tablet 1   Allergies  Allergen Reactions  . Influenza Vaccines     D/t being allergic to eggs  . Celecoxib     Able to take IBU  . Codeine Swelling  . Peanut-Containing Drug Products     Itching  . Prednisone     Rapid heart rate  . Prochlorperazine Edisylate     paralyzed  . Vioxx [Rofecoxib]     States able to take IBU  . Aspirin Rash    States able to take IBU  . Eggs Or Egg-Derived Products Rash  . Epinephrine Rash  . Erythromycin Rash  . Sulfa Antibiotics Rash  . Tetracycline Rash     Exam:  BP 168/86 mmHg  Pulse 70  Temp(Src) 98.8 F (37.1 C) (Oral)  Resp 16  SpO2 100% Gen: Well NAD HEENT: EOMI,  MMM clear nasal discharge. Normal posterior pharynx. Right tympanic membranes with effusion without erythema left is normal. Mild cervical lymphadenopathy present. Lungs: Normal work of breathing. CTABL Heart: RRR no MRG Abd: NABS, Soft. Nondistended,  Nontender Exts: Brisk capillary refill, warm and well perfused.   Patient was given a 2.5 mg albuterol nebulizer treatment, and felt better  No results found for this or any previous visit (from the past 24 hour(s)). No results found.  Assessment and Plan: 64 y.o. female with bronchitis and sinusitis. Discussed options. Plan to treat with Tessalon Perles, nasal steroids, albuterol, and Tylenol.  Discussed warning signs or symptoms. Please see discharge instructions. Patient expresses understanding.     Gregor Hams, MD 04/14/15 [phone]

## 2015-04-24 ENCOUNTER — Other Ambulatory Visit: Payer: Self-pay | Admitting: Internal Medicine

## 2015-04-29 ENCOUNTER — Other Ambulatory Visit: Payer: Self-pay | Admitting: Internal Medicine

## 2015-05-23 ENCOUNTER — Ambulatory Visit (INDEPENDENT_AMBULATORY_CARE_PROVIDER_SITE_OTHER): Payer: BLUE CROSS/BLUE SHIELD | Admitting: Gastroenterology

## 2015-05-23 ENCOUNTER — Encounter: Payer: Self-pay | Admitting: Gastroenterology

## 2015-05-23 VITALS — BP 132/84 | HR 68 | Ht 61.0 in | Wt 171.4 lb

## 2015-05-23 DIAGNOSIS — K5900 Constipation, unspecified: Secondary | ICD-10-CM

## 2015-05-23 DIAGNOSIS — Z86010 Personal history of colonic polyps: Secondary | ICD-10-CM | POA: Diagnosis not present

## 2015-05-23 DIAGNOSIS — R1012 Left upper quadrant pain: Secondary | ICD-10-CM | POA: Diagnosis not present

## 2015-05-23 DIAGNOSIS — R110 Nausea: Secondary | ICD-10-CM

## 2015-05-23 DIAGNOSIS — K219 Gastro-esophageal reflux disease without esophagitis: Secondary | ICD-10-CM | POA: Diagnosis not present

## 2015-05-23 DIAGNOSIS — K6289 Other specified diseases of anus and rectum: Secondary | ICD-10-CM

## 2015-05-23 DIAGNOSIS — R079 Chest pain, unspecified: Secondary | ICD-10-CM

## 2015-05-23 MED ORDER — OMEPRAZOLE 40 MG PO CPDR: 40.0000 mg | DELAYED_RELEASE_CAPSULE | Freq: Two times a day (BID) | ORAL | Status: DC

## 2015-05-23 NOTE — Patient Instructions (Addendum)
Increase your omeprazole 40 mg one tablet by mouth twice daily. A new prescription has been sent to your pharmacy.  You have been scheduled for a CT scan of the abdomen and pelvis at [facility] ([address]---this is in the same building as [facility]).   You are scheduled on 05/29/14 at 2:30pm. You should arrive 15 minutes prior to your appointment time for registration. Please follow the written instructions below on the day of your exam:  WARNING: IF YOU ARE ALLERGIC TO IODINE/X-RAY DYE, PLEASE NOTIFY RADIOLOGY IMMEDIATELY AT [phone]! YOU WILL BE GIVEN A 13 HOUR PREMEDICATION PREP.  1) Do not eat or drink anything after 10:30am (4 hours prior to your test) 2) You have been given 2 bottles of oral contrast to drink. The solution may taste better if refrigerated, but do NOT add ice or any other liquid to this solution. Shake  well before drinking.    Drink 1 bottle of contrast @ 12:30pm (2 hours prior to your exam)  Drink 1 bottle of contrast @ 1:30pm (1 hour prior to your exam)  You may take any medications as prescribed with a small amount of water except for the following: Metformin, Glucophage, Glucovance, Avandamet, Riomet, Fortamet, Actoplus Met, Janumet, Glumetza or Metaglip. The above medications must be held the day of the exam AND 48 hours after the exam.  The purpose of you drinking the oral contrast is to aid in the visualization of your intestinal tract. The contrast solution may cause some diarrhea. Before your exam is started, you will be given a small amount of fluid to drink. Depending on your individual set of symptoms, you may also receive an intravenous injection of x-ray contrast/dye. Plan on being at [facility] for 30 minutes or long, depending on the type of exam you are having performed.  This test typically takes 30-45 minutes to complete.  If you have any questions regarding your exam or if you need to reschedule, you may call the  CT department at [phone] between the hours of 8:00 am and 5:00 pm, Monday-Friday.  ________________________________________________________________________  Thank you for choosing me and [facility].  Pricilla Riffle. Dagoberto Ligas., MD., [facility]

## 2015-05-23 NOTE — Progress Notes (Signed)
    History of Present Illness: This is a 64 year old female with multiple complaints including LUQ pain, rectal pain, nausea, constipation, small volume rectal bleeding and reflux symptoms. She previously underwent EGD in 2008 for dysphagia and reflux symptoms. No stricture was noted. Empiric dilation was performed. Esophageal erythema and mild gastritis was noted. She describes intermittent left upper quadrant and left lower chest pain that comes on randomly and lasts for several minutes to 30 minutes at a time the past few months. It is not associated with meals or bowel movements. She also notes intermittent rectal pain that is often bothersome at night it is painful and she has to get up to walk around at night the pain will usually subside after 15-30 minutes also for the past few months. She has noted tiny amounts of bright red blood on the tissue paper with constipated bowel movements. She underwent colonoscopy in 05/2014 which showed internal hemorrhoids and adenomatous colon polyps. She has ongoing problems with constipation which is recently improved with the regular use of MiraLAX. She has had some relief of her rectal symptoms using hydrocortisone suppositories.  Current Medications, Allergies, Past Medical History, Past Surgical History, Family History and Social History were reviewed in [employer] record.  Physical Exam: General: Well developed , well nourished, no acute distress Head: Normocephalic and atraumatic Eyes:  sclerae anicteric, EOMI Ears: Normal auditory acuity Mouth: No deformity or lesions Lungs: Clear throughout to auscultation. Left lower anterior chest wall tenderness Heart: Regular rate and rhythm; no murmurs, rubs or bruits Abdomen: Soft, mildly tender in LUQ and non distended. No masses, hepatosplenomegaly or hernias noted. Normal Bowel sounds Musculoskeletal: Symmetrical with no gross deformities  Pulses:  Normal pulses noted Extremities: No  clubbing, cyanosis, edema or deformities noted Neurological: Alert oriented x 4, grossly nonfocal Psychological:  Alert and cooperative. Normal mood and affect  Assessment and Recommendations:  1. GERD and nausea. Increase omeprazole to 40 mg twice daily taken before breakfast and dinner. Closely follow all antireflux measures. Consider EGD if symptoms not adequately controlled.  2. Personal history of adenomatous colon polyps. Five-year interval surveillance colonoscopy recommended August 2020.  3. LUQ pain/left lower chest wall pain. This seems to be musculoskeletal pain as she is quite tender on her left lower chest wall. Schedule abdominal/pelvic CT for further evaluation. Follow-up with PCP.   4. Intermittent rectal pain. Frequently at night. She may have proctalgia fugax. Possible musculoskeletal or neuropathic problem. Colonoscopy last year showed only internal hemorrhoids in the rectum. This does not sound typical for hemorrhoidal symptoms. Further evaluation with pelvic CT.   5. Personal history of adenomatous colon polyps. Five-year interval surveillance colonoscopy recommended August 2020.

## 2015-05-24 ENCOUNTER — Encounter: Payer: Self-pay | Admitting: Nurse Practitioner

## 2015-05-28 ENCOUNTER — Telehealth: Payer: Self-pay

## 2015-05-28 DIAGNOSIS — R110 Nausea: Secondary | ICD-10-CM

## 2015-05-28 DIAGNOSIS — K5909 Other constipation: Secondary | ICD-10-CM

## 2015-05-28 DIAGNOSIS — R1012 Left upper quadrant pain: Secondary | ICD-10-CM

## 2015-05-28 DIAGNOSIS — R079 Chest pain, unspecified: Secondary | ICD-10-CM

## 2015-05-28 NOTE — Telephone Encounter (Signed)
[employer] required a peer to peer review for coverage of the CT scan of the abdomen and pelvis. Nicoletta Ba, PA did the peer to peer review and patient's insurance company denied coverage. Called CT scheduling and cancelled CT scan. Called patient and left a voicemail for her to contact our office. Dr. Fuller Plan, please advise of next step.

## 2015-05-29 NOTE — Telephone Encounter (Signed)
Informed patient of Dr. Lynne Leader recommendations and pt wants to proceed with scheduling an ultrasound. Ultrasound scheduled for 06/07/15 at 8:30am. Informed patient of date and time of test and to be npo 6 hours prior to test. Pt verbalized understanding.

## 2015-05-29 NOTE — Telephone Encounter (Signed)
Left a message for patient to return my call. 

## 2015-05-29 NOTE — Telephone Encounter (Signed)
Left message for patient to return my call on the phone number she request I call.

## 2015-05-29 NOTE — Telephone Encounter (Signed)
Patient returned phone call. Best # [phone]

## 2015-05-29 NOTE — Telephone Encounter (Signed)
OK. Schedule abd [place] for LUQ pain.

## 2015-05-30 ENCOUNTER — Inpatient Hospital Stay: Admission: RE | Admit: 2015-05-30 | Payer: BLUE CROSS/BLUE SHIELD | Source: Ambulatory Visit

## 2015-05-30 ENCOUNTER — Other Ambulatory Visit: Payer: Self-pay | Admitting: Internal Medicine

## 2015-06-04 ENCOUNTER — Telehealth: Payer: Self-pay | Admitting: Nurse Practitioner

## 2015-06-04 ENCOUNTER — Encounter: Payer: Self-pay | Admitting: Nurse Practitioner

## 2015-06-04 NOTE — Telephone Encounter (Signed)
FYI - I have called the patient several times and left messages to call me back about this referral, so has the Audiologists office. We also mailed a letter to the patient with no response.

## 2015-06-07 ENCOUNTER — Ambulatory Visit (HOSPITAL_COMMUNITY)
Admission: RE | Admit: 2015-06-07 | Discharge: 2015-06-07 | Disposition: A | Discharge status: Home / Self Care | Payer: BLUE CROSS/BLUE SHIELD | Source: Ambulatory Visit | Attending: Gastroenterology | Admitting: Gastroenterology

## 2015-06-07 DIAGNOSIS — R079 Chest pain, unspecified: Secondary | ICD-10-CM

## 2015-06-07 DIAGNOSIS — R110 Nausea: Secondary | ICD-10-CM

## 2015-06-07 DIAGNOSIS — K760 Fatty (change of) liver, not elsewhere classified: Secondary | ICD-10-CM | POA: Insufficient documentation

## 2015-06-07 DIAGNOSIS — R1012 Left upper quadrant pain: Secondary | ICD-10-CM | POA: Diagnosis present

## 2015-06-07 DIAGNOSIS — K5909 Other constipation: Secondary | ICD-10-CM

## 2015-06-19 ENCOUNTER — Other Ambulatory Visit: Payer: Self-pay | Admitting: Obstetrics & Gynecology

## 2015-06-20 LAB — CYTOLOGY - PAP

## 2015-06-28 ENCOUNTER — Other Ambulatory Visit: Payer: Self-pay | Admitting: Obstetrics & Gynecology

## 2015-06-28 DIAGNOSIS — N644 Mastodynia: Principal | ICD-10-CM

## 2015-06-28 DIAGNOSIS — Z803 Family history of malignant neoplasm of breast: Secondary | ICD-10-CM

## 2015-06-28 DIAGNOSIS — G8929 Other chronic pain: Secondary | ICD-10-CM

## 2015-06-28 DIAGNOSIS — N643 Galactorrhea not associated with childbirth: Secondary | ICD-10-CM

## 2015-10-31 ENCOUNTER — Other Ambulatory Visit: Payer: Self-pay | Admitting: Nurse Practitioner

## 2015-12-05 ENCOUNTER — Encounter: Payer: Self-pay | Admitting: Nurse Practitioner

## 2016-01-04 ENCOUNTER — Other Ambulatory Visit: Payer: Self-pay | Admitting: Nurse Practitioner

## 2016-04-08 ENCOUNTER — Other Ambulatory Visit: Payer: Self-pay | Admitting: *Deleted

## 2016-04-08 DIAGNOSIS — R739 Hyperglycemia, unspecified: Secondary | ICD-10-CM

## 2016-04-08 DIAGNOSIS — Z0000 Encounter for general adult medical examination without abnormal findings: Secondary | ICD-10-CM

## 2016-04-08 DIAGNOSIS — I10 Essential (primary) hypertension: Secondary | ICD-10-CM

## 2016-04-09 ENCOUNTER — Encounter: Payer: Self-pay | Admitting: Internal Medicine

## 2016-04-09 ENCOUNTER — Other Ambulatory Visit: Payer: BLUE CROSS/BLUE SHIELD

## 2016-04-09 ENCOUNTER — Ambulatory Visit (INDEPENDENT_AMBULATORY_CARE_PROVIDER_SITE_OTHER): Payer: BLUE CROSS/BLUE SHIELD | Admitting: Internal Medicine

## 2016-04-09 VITALS — BP 142/82 | HR 75 | Temp 97.9°F | Ht 61.42 in | Wt 176.2 lb

## 2016-04-09 DIAGNOSIS — N644 Mastodynia: Secondary | ICD-10-CM

## 2016-04-09 DIAGNOSIS — R7303 Prediabetes: Secondary | ICD-10-CM | POA: Diagnosis not present

## 2016-04-09 DIAGNOSIS — N6320 Unspecified lump in the left breast, unspecified quadrant: Secondary | ICD-10-CM

## 2016-04-09 DIAGNOSIS — R413 Other amnesia: Secondary | ICD-10-CM

## 2016-04-09 DIAGNOSIS — N63 Unspecified lump in breast: Secondary | ICD-10-CM | POA: Diagnosis not present

## 2016-04-09 DIAGNOSIS — K219 Gastro-esophageal reflux disease without esophagitis: Secondary | ICD-10-CM

## 2016-04-09 DIAGNOSIS — Z0000 Encounter for general adult medical examination without abnormal findings: Secondary | ICD-10-CM

## 2016-04-09 DIAGNOSIS — R739 Hyperglycemia, unspecified: Secondary | ICD-10-CM

## 2016-04-09 DIAGNOSIS — I10 Essential (primary) hypertension: Secondary | ICD-10-CM | POA: Diagnosis not present

## 2016-04-09 MED ORDER — TETANUS-DIPHTH-ACELL PERTUSSIS 5-2.5-18.5 LF-MCG/0.5 IM SUSP: 0.5000 mL | Freq: Once | INTRAMUSCULAR | Status: DC

## 2016-04-09 NOTE — Progress Notes (Signed)
Patient ID: Mary Collier, female   DOB: 1951-07-28, 65 y.o.   MRN: [id number]   Location:  [facility]  Place of Service:  OFFICE  Provider: Arletha Grippe, DO  Patient Care Team: Gildardo Cranker, DO as PCP - General (Internal Medicine)  Extended Emergency Contact Information Primary Emergency Contact: Behunin,Jerome Address: [address]          [place], [place] [address] Johnnette Litter of [place] Phone: [phone] Mobile Phone: [phone] Relation: Spouse  Code Status: FULL CODE Goals of Care: Advanced Directive information Advanced Directives 04/09/2016  Does patient have an advance directive? No  Would patient like information on creating an advanced directive? No - patient declined information  Pre-existing out of facility DNR order (yellow form or pink MOST form) -     Chief Complaint  Patient presents with  . Annual Exam    Yearly Exam    HPI: Patient is a 65 y.o. female seen in today for an annual wellness exam. Her sister recently was dx with breast cancer and had mastectomy. Her mother is deceased and also had breast cancer. She is c/a short term memory loss and is very forgetful. No new stressors  Prediabetes - attempts yo make healthy food choices. Last A1c 6.1%  HTN - BP stable on metoprolol  GERD - sx's stable on omeprazole. Constipation stable om senna and miralax  Hyperlipidemia - stable on lipitor. LDL 84  Glaucoma - stable on lumigan. Followed by eye specialist  Left breast pain - last mammogram/US in 2016. She plans to have a left breast MRI due to pain and  FHx breast CA. She does monthly self breast exam  Depression screen [facility] 2/9 04/09/2016 07/12/2014 04/11/2014 03/29/2013  Decreased Interest 0 0 0 0  Down, Depressed, Hopeless 0 0 0 0  PHQ - 2 Score 0 0 0 0    Fall Risk  04/09/2016 04/03/2015 07/12/2014 04/11/2014 03/29/2013  Falls in the past year? No No No Yes No  Number falls in past yr: - - - 1 -  Injury with Fall? - - - No -  Risk for fall  due to : - - - Other (Comment) -  Risk for fall due to (comments): - - - patient had fall in setting of nightmare -   No flowsheet data found.   Health Maintenance  Topic Date Due  . Hepatitis C Screening  06/[birth year]/52  . HIV Screening  11/06/1966  . TETANUS/TDAP  11/06/1970  . MAMMOGRAM  06/17/2014  . ZOSTAVAX  04/02/2017 (Originally 11/07/2011)  . INFLUENZA VACCINE  05/20/2016  . PAP SMEAR  06/18/2018  . COLONOSCOPY  05/24/2019    Urinary incontinence? None   Functional Status Survey: Is the patient deaf or have difficulty hearing?: No Does the patient have difficulty seeing, even when wearing glasses/contacts?: No Does the patient have difficulty concentrating, remembering, or making decisions?: Yes (she has noticed short term memory loss x 1 yr) Does the patient have difficulty walking or climbing stairs?: No Does the patient have difficulty dressing or bathing?: No Does the patient have difficulty doing errands alone such as visiting a doctor's office or shopping?: No  Exercise? Walks daily  Diet? Attempts to make healthy food choices  No exam data present  Hearing: no issues    Dentition: sees dentist 2 times per year  Pain: none   Past Medical History  Diagnosis Date  . Cancer of kidney ([place])     at age 31.  Right kidney.  . Lung cancer ([place])     at age 63  Right lung.  . Cancer ([place])     at age 30.  abdominal  . Allergic rhinitis   . Chronic headache   . Hiatal hernia   . Internal hemorrhoid   . IBS (irritable bowel syndrome)   . GERD (gastroesophageal reflux disease)   . Peptic stricture of esophagus   . Asthma   . Reflux esophagitis   . Glaucoma   . Hypertension   . Unspecified vitamin D deficiency   . Other and unspecified hyperlipidemia   . Edema   . Other abnormal blood chemistry   . Lump or mass in breast   . Unspecified menopausal and postmenopausal disorder   . Lumbago   . Other convulsions     hx seizures at age 10  . Arthritis   .  Allergy   . Blood transfusion without reported diagnosis     1966  . Tubular adenoma of colon 2015    Past Surgical History  Procedure Laterality Date  . Lung surgery  at age 13    Right  . Nasal sinus surgery  1980s  . Total abdominal hysterectomy  2000s  . Breast surgery  1970s and 1980s    Bilateral cysts removed  . Kidney surgery  at age 72    Right  . Abdominal surgery  at age 65   . Tonsillectomy  1970    Family History  Problem Relation Age of Onset  . Allergies Sister     x3  . Asthma Sister   . Heart disease Other     2 siblings  . Bone cancer Father   . Lung cancer Father   . Cancer Father   . Breast cancer Mother   . Cancer Mother   . Cancer Brother   . Hypertension Brother   . Diabetes Brother   . Heart disease Sister   . Diabetes Sister   . Hypertension Sister   . Colon cancer Neg Hx    Family Status  Relation Status Death Age  . Sister Alive   . Sister Alive   . Father Deceased   . Mother Deceased   . Brother Deceased   . Brother Alive   . Brother Alive   . Sister Alive   . Sister Alive   . Brother Alive   . Sister Alive   . Sister Alive   . Sister Alive   . Sister Alive     Social History   Social History  . Marital Status: Married    Spouse Name: N/A  . Number of Children: 0  . Years of Education: N/A   Occupational History  . education specialist (preschool)     Social History Main Topics  . Smoking status: Never Smoker   . Smokeless tobacco: Never Used  . Alcohol Use: No  . Drug Use: No  . Sexual Activity: Not on file   Other Topics Concern  . Not on file   Social History Narrative   Pt has 18 siblings.   No caffeine drinks     Allergies  Allergen Reactions  . Influenza Vaccines     D/t being allergic to eggs  . Celecoxib     Able to take IBU  . Codeine Swelling  . Compazine [Prochlorperazine]   . Peanut-Containing Drug Products     Itching  . Prednisone     Rapid heart rate  . Prochlorperazine  Edisylate      paralyzed  . Vioxx [Rofecoxib]     States able to take IBU  . Aspirin Rash    States able to take IBU  . Eggs Or Egg-Derived Products Rash  . Epinephrine Rash  . Erythromycin Rash  . Sulfa Antibiotics Rash  . Tetracycline Rash      Medication List       This list is accurate as of: 04/09/16  3:57 PM.  Always use your most recent med list.               atorvastatin 10 MG tablet  Commonly known as:  LIPITOR  TAKE 1 TABLET (10 MG TOTAL) BY MOUTH DAILY.     bimatoprost 0.03 % ophthalmic solution  Commonly known as:  LUMIGAN  Place 1 drop into both eyes at bedtime.     calcium-vitamin D 500-200 MG-UNIT tablet  Commonly known as:  OSCAL 500/200 D-3  Take 1 tablet by mouth 2 (two) times daily.     [employer] SENNA PLUS 8.6-50 MG tablet  Generic drug:  senna-docusate  TAKE 1 TABLET BY MOUTH DAILY.     folic acid 1 MG tablet  Commonly known as:  FOLVITE  Take 1 mg by mouth daily.     hydrocortisone 25 MG suppository  Commonly known as:  ANUSOL-HC  PLACE 1 SUPPOSITORY PER RECTUM TWICE A DAY     metoprolol succinate 25 MG 24 hr tablet  Commonly known as:  TOPROL-XL  TAKE 1 TABLET BY MOUTH DAILY     omeprazole 40 MG capsule  Commonly known as:  PRILOSEC  Take 1 capsule (40 mg total) by mouth 2 (two) times daily.     polyethylene glycol powder powder  Commonly known as:  GLYCOLAX/MIRALAX  Take 17 g by mouth daily.     Tdap 5-2.5-18.5 LF-MCG/0.5 injection  Commonly known as:  BOOSTRIX  Inject 0.5 mLs into the muscle once.     TYLENOL 325 MG tablet  Generic drug:  acetaminophen  Take 325 mg by mouth as needed.         Review of Systems:  Review of Systems  Neurological:       Memory loss  All other systems reviewed and are negative.   Physical Exam: Filed Vitals:   04/09/16 1458  BP: 142/82  Pulse: 75  Temp: 97.9 F (36.6 C)  TempSrc: Oral  Height: 5' 1.42" (1.56 m)  Weight: 176 lb 3.2 oz (79.924 kg)  SpO2: 98%   Body mass index is 32.84  kg/(m^2). Physical Exam  Constitutional: She is oriented to person, place, and time. She appears well-developed and well-nourished. No distress.  HENT:  Head: Normocephalic and atraumatic.  Right Ear: Hearing, tympanic membrane, external ear and ear canal normal.  Left Ear: Hearing, tympanic membrane, external ear and ear canal normal.  Mouth/Throat: Uvula is midline, oropharynx is clear and moist and mucous membranes are normal. She does not have dentures.  Eyes: Conjunctivae, EOM and lids are normal. Pupils are equal, round, and reactive to light. No scleral icterus.  Neck: Trachea normal and normal range of motion. Neck supple. Carotid bruit is not present. No thyroid mass and no thyromegaly present.  Cardiovascular: Normal rate, regular rhythm and intact distal pulses.  Exam reveals no gallop and no friction rub.   Murmur (1/6 SEM) heard. No carotid bruit b/l. No LE edema b/l. No calf TTP.   Pulmonary/Chest: Effort normal and breath sounds normal. She has no wheezes. She  has no rhonchi. She has no rales. Right breast exhibits no inverted nipple, no mass, no nipple discharge, no skin change and no tenderness. Left breast exhibits tenderness. Left breast exhibits no inverted nipple, no mass, no nipple discharge and no skin change. Breasts are symmetrical.  Left breast lumpy, bumpy and TTP; no distinct palpable mass  Abdominal: Soft. Normal appearance, normal aorta and bowel sounds are normal. She exhibits no pulsatile midline mass and no mass. There is no hepatosplenomegaly. There is no tenderness. There is no rigidity, no rebound and no guarding. No hernia.  Genitourinary:  Deferred to GYN  Musculoskeletal: Normal range of motion.  Lymphadenopathy:       Head (right side): No posterior auricular adenopathy present.       Head (left side): No posterior auricular adenopathy present.    She has no cervical adenopathy.       Right: No supraclavicular adenopathy present.       Left: No  supraclavicular adenopathy present.  Neurological: She is alert and oriented to person, place, and time. She has normal strength and normal reflexes. No cranial nerve deficit. Gait normal.  Skin: Skin is warm, dry and intact. No rash noted. Nails show no clubbing.  Thickened toenails b/l with yellowish nail color  Psychiatric: She has a normal mood and affect. Her speech is normal and behavior is normal. Judgment and thought content normal. Cognition and memory are normal.    Labs reviewed: drawn this AM  Basic Metabolic Panel: No results for input(s): NA, K, CL, CO2, GLUCOSE, BUN, CREATININE, CALCIUM, MG, PHOS, TSH in the last 8760 hours. Liver Function Tests: No results for input(s): AST, ALT, ALKPHOS, BILITOT, PROT, ALBUMIN in the last 8760 hours. No results for input(s): LIPASE, AMYLASE in the last 8760 hours. No results for input(s): AMMONIA in the last 8760 hours. CBC: No results for input(s): WBC, NEUTROABS, HGB, HCT, MCV, PLT in the last 8760 hours. Lipid Panel: No results for input(s): CHOL, HDL, LDLCALC, TRIG, CHOLHDL, LDLDIRECT in the last 8760 hours. Lab Results  Component Value Date   HGBA1C 6.1* 04/11/2014    Procedures: No results found. ECG OBTAINED AND REVIEWED BY MYSELF:  NSr @ 78 bpm, LAD, LAE, poor R wave progression, no acute ischemic changes. No change since 2015  Assessment/Plan   ICD-9-CM ICD-10-CM   1. Well adult exam V70.0 Z00.00   2. Essential hypertension 401.9 I10 EKG 12-Lead  3. Prediabetes 790.29 R73.03   4. Gastroesophageal reflux disease, esophagitis presence not specified 530.81 K21.9   5. Short-term memory loss - possibly stress induced 780.93 R41.3     Pt is UTD on health maintenance. Vaccinations are UTD. Pt maintains a healthy lifestyle. Encouraged pt to exercise 30-45 minutes 4-5 times per week. Eat a well balanced diet. Avoid smoking. Limit alcohol intake. Wear seatbelt when riding in the car. Wear sun block (SPF >50) when spending extended  times outside.  MMSE score 30/30  Continue current medications as ordered  Follow up with GYN as scheduled. Recommend she discuss BRCA gene testing due to FHx breast CA with GYN. F/u for breast MRI  recommend stress reduction activities  Follow up in 1 yr for CPE/ECG. Fasting labs prior to appt (cbcw diff, cmp, lipid panel, a1c, tsh, ua)   Jobina Maita S. Perlie Gold  [facility] [address] [place], [place] [address] [phone] Cell (Monday-Friday 8 AM - 5 PM) [phone] After 5 PM and follow  prompts

## 2016-04-09 NOTE — Patient Instructions (Signed)
Encouraged pt to exercise 30-45 minutes 4-5 times per week. Eat a well balanced diet. Avoid smoking. Limit alcohol intake. Wear seatbelt when riding in the car. Wear sun block (SPF >50) when spending extended times outside.  Continue current medications as ordered  Follow up with GYN as scheduled  Follow up in 1 yr for CPE/ECG

## 2016-04-10 LAB — COMPREHENSIVE METABOLIC PANEL
ALBUMIN: 4.1 g/dL (ref 3.6–4.8)
ALT: 26 IU/L (ref 0–32)
AST: 19 IU/L (ref 0–40)
Albumin/Globulin Ratio: 1.2 (ref 1.2–2.2)
Alkaline Phosphatase: 84 IU/L (ref 39–117)
BUN / CREAT RATIO: 23 (ref 12–28)
BUN: 19 mg/dL (ref 8–27)
Bilirubin Total: <0.2 mg/dL (ref 0.0–1.2)
CALCIUM: 9.6 mg/dL (ref 8.7–10.3)
CO2: 24 mmol/L (ref 18–29)
CREATININE: 0.84 mg/dL (ref 0.57–1.00)
Chloride: 104 mmol/L (ref 96–106)
GFR calc Af Amer: 85 mL/min/{1.73_m2} (ref >59)
GFR, EST NON AFRICAN AMERICAN: 74 mL/min/{1.73_m2} (ref >59)
GLOBULIN, TOTAL: 3.5 g/dL (ref 1.5–4.5)
Glucose: 98 mg/dL (ref 65–99)
Potassium: 4.6 mmol/L (ref 3.5–5.2)
SODIUM: 142 mmol/L (ref 134–144)
Total Protein: 7.6 g/dL (ref 6.0–8.5)

## 2016-04-10 LAB — CBC WITH DIFFERENTIAL/PLATELET
BASOS: 0 %
Basophils Absolute: 0 10*3/uL (ref 0.0–0.2)
EOS (ABSOLUTE): 0.1 10*3/uL (ref 0.0–0.4)
EOS: 1 %
Hematocrit: 39.5 % (ref 34.0–46.6)
Hemoglobin: 12.9 g/dL (ref 11.1–15.9)
IMMATURE GRANULOCYTES: 0 %
Immature Grans (Abs): 0 10*3/uL (ref 0.0–0.1)
LYMPHS ABS: 1.6 10*3/uL (ref 0.7–3.1)
Lymphs: 21 %
MCH: 29.2 pg (ref 26.6–33.0)
MCHC: 32.7 g/dL (ref 31.5–35.7)
MCV: 89 fL (ref 79–97)
Monocytes Absolute: 0.8 10*3/uL (ref 0.1–0.9)
Monocytes: 10 %
Neutrophils Absolute: 5.2 10*3/uL (ref 1.4–7.0)
Neutrophils: 68 %
Platelets: 254 10*3/uL (ref 150–379)
RBC: 4.42 x10E6/uL (ref 3.77–5.28)
RDW: 13.8 % (ref 12.3–15.4)
WBC: 7.6 10*3/uL (ref 3.4–10.8)

## 2016-04-10 LAB — HEMOGLOBIN A1C
Est. average glucose Bld gHb Est-mCnc: 131 mg/dL
Hgb A1c MFr Bld: 6.2 % — ABNORMAL HIGH (ref 4.8–5.6)

## 2016-04-12 LAB — TSH: TSH: 3.98 u[IU]/mL (ref 0.450–4.500)

## 2016-04-12 LAB — LIPID PANEL
CHOLESTEROL TOTAL: 171 mg/dL (ref 100–199)
Chol/HDL Ratio: 3.6 ratio units (ref 0.0–4.4)
HDL: 47 mg/dL (ref >39)
LDL Calculated: 96 mg/dL (ref 0–99)
Triglycerides: 142 mg/dL (ref 0–149)
VLDL Cholesterol Cal: 28 mg/dL (ref 5–40)

## 2016-04-12 LAB — SPECIMEN STATUS REPORT

## 2016-04-15 ENCOUNTER — Other Ambulatory Visit: Payer: Self-pay | Admitting: Obstetrics & Gynecology

## 2016-04-15 DIAGNOSIS — N644 Mastodynia: Secondary | ICD-10-CM

## 2016-04-21 ENCOUNTER — Ambulatory Visit
Admission: RE | Admit: 2016-04-21 | Discharge: 2016-04-21 | Disposition: A | Discharge status: Home / Self Care | Payer: BLUE CROSS/BLUE SHIELD | Source: Ambulatory Visit | Attending: Obstetrics & Gynecology | Admitting: Obstetrics & Gynecology

## 2016-04-21 DIAGNOSIS — N644 Mastodynia: Secondary | ICD-10-CM

## 2016-04-25 ENCOUNTER — Other Ambulatory Visit: Payer: Self-pay | Admitting: Nurse Practitioner

## 2016-04-25 ENCOUNTER — Other Ambulatory Visit: Payer: Self-pay | Admitting: Internal Medicine

## 2016-04-28 ENCOUNTER — Other Ambulatory Visit: Payer: Self-pay | Admitting: Internal Medicine

## 2016-05-08 ENCOUNTER — Other Ambulatory Visit: Payer: Self-pay | Admitting: Obstetrics & Gynecology

## 2016-05-08 DIAGNOSIS — Z1231 Encounter for screening mammogram for malignant neoplasm of breast: Secondary | ICD-10-CM

## 2016-05-14 ENCOUNTER — Ambulatory Visit (INDEPENDENT_AMBULATORY_CARE_PROVIDER_SITE_OTHER): Payer: BLUE CROSS/BLUE SHIELD | Admitting: Podiatry

## 2016-05-14 DIAGNOSIS — B351 Tinea unguium: Secondary | ICD-10-CM

## 2016-05-14 DIAGNOSIS — L600 Ingrowing nail: Secondary | ICD-10-CM | POA: Diagnosis not present

## 2016-05-15 NOTE — Progress Notes (Signed)
Subjective:     Patient ID: Mary Collier, female   DOB: 13-Dec-1950, 65 y.o.   MRN: [id number]  HPI patient presents stating that she has thickness of the big toenails bilateral and second right and did lose the hallux nail left at one time   Review of Systems     Objective:   Physical Exam Neurovascular status was found to be intact muscle strength was adequate range of motion within normal limits with quite a bit of discomfort now on the right hallux nail with looseness of the nailbed and continued yellow like discoloration.    Assessment:     Mycotic nail infection with loose hallux nail right that's painful with history of having had it removed and did well for an approximate 3-4 year.    Plan:     Reviewed condition and recommended we remove the nail again allowing it to regrow and that ultimately it may need to be removed permanently. Patient understands and wants this procedure for the long-term and today I did go ahead and I infiltrated the right hallux 60 mg Xylocaine Marcaine mixture remove the nail flush the base and applied sterile dressing. Gave instructions on soaks and reappoint

## 2016-05-21 ENCOUNTER — Encounter: Payer: Self-pay | Admitting: Internal Medicine

## 2016-06-03 ENCOUNTER — Telehealth: Payer: Self-pay | Admitting: *Deleted

## 2016-06-03 NOTE — Telephone Encounter (Signed)
Left message for patient at [phone] (Home #) to check to see how they were doing from their ingrown toenail procedure that was performed on Wednesday, May 14, 2016. Waiting for a response.

## 2016-06-19 ENCOUNTER — Ambulatory Visit
Admission: RE | Admit: 2016-06-19 | Discharge: 2016-06-19 | Disposition: A | Discharge status: Home / Self Care | Payer: BLUE CROSS/BLUE SHIELD | Source: Ambulatory Visit | Attending: Obstetrics & Gynecology | Admitting: Obstetrics & Gynecology

## 2016-06-19 DIAGNOSIS — Z1231 Encounter for screening mammogram for malignant neoplasm of breast: Secondary | ICD-10-CM

## 2016-07-01 ENCOUNTER — Other Ambulatory Visit: Payer: Self-pay | Admitting: Obstetrics & Gynecology

## 2016-07-01 DIAGNOSIS — Z803 Family history of malignant neoplasm of breast: Secondary | ICD-10-CM

## 2016-07-09 ENCOUNTER — Ambulatory Visit
Admission: RE | Admit: 2016-07-09 | Discharge: 2016-07-09 | Disposition: A | Discharge status: Home / Self Care | Payer: BLUE CROSS/BLUE SHIELD | Source: Ambulatory Visit | Attending: Obstetrics & Gynecology | Admitting: Obstetrics & Gynecology

## 2016-07-09 DIAGNOSIS — Z803 Family history of malignant neoplasm of breast: Secondary | ICD-10-CM

## 2016-07-09 MED ORDER — GADOBENATE DIMEGLUMINE 529 MG/ML IV SOLN
16.0000 mL | Freq: Once | INTRAVENOUS | Status: AC | PRN
  Administered 2016-07-09: 16 mL via INTRAVENOUS

## 2016-08-26 ENCOUNTER — Encounter (INDEPENDENT_AMBULATORY_CARE_PROVIDER_SITE_OTHER): Payer: Self-pay

## 2016-08-26 ENCOUNTER — Encounter: Payer: Self-pay | Admitting: Nurse Practitioner

## 2016-08-26 ENCOUNTER — Ambulatory Visit (INDEPENDENT_AMBULATORY_CARE_PROVIDER_SITE_OTHER): Payer: BLUE CROSS/BLUE SHIELD | Admitting: Nurse Practitioner

## 2016-08-26 VITALS — BP 158/86 | HR 66 | Ht 61.0 in | Wt 172.0 lb

## 2016-08-26 DIAGNOSIS — R110 Nausea: Secondary | ICD-10-CM | POA: Diagnosis not present

## 2016-08-26 DIAGNOSIS — R1012 Left upper quadrant pain: Secondary | ICD-10-CM | POA: Diagnosis not present

## 2016-08-26 MED ORDER — ONDANSETRON HCL 4 MG PO TABS: 4.0000 mg | ORAL_TABLET | Freq: Four times a day (QID) | ORAL | 0 refills | Status: DC | PRN

## 2016-08-26 MED ORDER — DICYCLOMINE HCL 10 MG PO CAPS: 10.0000 mg | ORAL_CAPSULE | Freq: Two times a day (BID) | ORAL | 0 refills | Status: DC | PRN

## 2016-08-26 NOTE — Progress Notes (Signed)
HPI: Patient is a 65 year old female known to Dr. Fuller Plan for history of GERD. She was evaluated by him August 2016 for left upper quadrant pain and nausea. CT scan of the abdomen and pelvis was ordered but denied by [facility].despite a peer to peer review. Her symptoms eventually resolved Two to three weeks ago patient developed nausea and vomiting, loose stool and diffuse crampy mid to abdominal pain.. Pain is constant, worse with meals. abdominal pain. No associated fevers. No sick contacts. No medication changes. No recent antibiotics.. Vomiting has subsided but nausea persists. Stools no longer loose but not back to baseline.  She has been following a bland diet. Her reflux has gotten worse over the last couple weeks as well.    Past Medical History:  Diagnosis Date  . Allergic rhinitis   . Allergy   . Arthritis   . Asthma   . Blood transfusion without reported diagnosis    1966  . Cancer ([place])    at age 29.  abdominal  . Cancer of kidney ([place])    at age 42.  Right kidney.  . Chronic headache   . Edema   . GERD (gastroesophageal reflux disease)   . Glaucoma   . Hiatal hernia   . Hypertension   . IBS (irritable bowel syndrome)   . Internal hemorrhoid   . Lumbago   . Lump or mass in breast   . Lung cancer ([place])    at age 43  Right lung.  . Other abnormal blood chemistry   . Other and unspecified hyperlipidemia   . Other convulsions    hx seizures at age 37  . Peptic stricture of esophagus   . Reflux esophagitis   . Tubular adenoma of colon 2015  . Unspecified menopausal and postmenopausal disorder   . Unspecified vitamin D deficiency     Patient's surgical history, family medical history, social history, medications and allergies were all reviewed in Epic    Physical Exam: BP (!) 158/86   Pulse 66   Ht '5\' 1"'$  (1.549 m)   Wt 172 lb (78 kg)   BMI 32.50 kg/m   GENERAL: Obese black female in in NAD PSYCH: :Pleasant, cooperative, flataffect HEENT:  Normocephalic, conjunctiva pink, mucous membranes moist, neck supple without masses CARDIAC:  RRR, no murmur heard,  No peripheral edema PULM: Normal respiratory effort, lungs CTA bilaterally, no wheezing ABDOMEN:  Soft, mild epigastric and LUQ tenderness. No obvious masses, no hepatomegaly,  normal bowel sounds SKIN:  turgor, no lesions seen Musculoskeletal:  Normal muscle tone, normal strength. Left anterolateral rib cage markedly tender NEURO: Alert and oriented x 3, no focal neurologic deficits  ASSESSMENT and PLAN:  79. 65 year old female with chronic, episodic upper abdominal pain and nausea. Similar symptoms Aug 2016,  felt to be musculoskeletal pain.  CT scan at that time was denied by [facility]. Ultrasound done instead and unremarkable.  Symptoms resolved and patient has felt fine until two weeks ago when she developed reurrent upper abdominal pain and nausea. .  -Patient looks okay, abdominal exam not overly concerning and her weight has been stable. For now will treat with anti-emetics,  low dose bentyl (10 mg BID prn ) for a few days. If Bentyl not effective after a few doses she will stop it. If Bentyl helps then patient will need to contact her Opthomologist to see if safe to continue given her history of glaucoma.   -ROV in 1 month  with me, or sooner for persistent / progressive symptoms.  -If symptoms do persist will obtain a CT scan - if insurance company with approve it to be done.     2. History of adenomatous colon polyps, due for surveillance colonoscopy Aug 2020  3. GERD, symptomatic over last two weeks but getting better.  -continue PPI  Tye Savoy  08/26/2016, 10:34 AM

## 2016-08-26 NOTE — Patient Instructions (Signed)
We have sent the following medications to your pharmacy for you to pick up at your convenience: Bentyl 10 mg twice a day - please increase fluid intake with this medication may cause dry eyes or mouth.  Zofran 4 mg every 6 hours as needed for nausea.

## 2016-08-28 NOTE — Progress Notes (Signed)
Reviewed and agree with initial management plan.  Katti Pelle T. Aundra Espin, MD FACG 

## 2016-10-23 ENCOUNTER — Encounter (INDEPENDENT_AMBULATORY_CARE_PROVIDER_SITE_OTHER): Payer: BLUE CROSS/BLUE SHIELD | Admitting: Ophthalmology

## 2016-10-23 DIAGNOSIS — H5340 Unspecified visual field defects: Secondary | ICD-10-CM

## 2016-10-23 DIAGNOSIS — I10 Essential (primary) hypertension: Secondary | ICD-10-CM

## 2016-10-23 DIAGNOSIS — H35033 Hypertensive retinopathy, bilateral: Secondary | ICD-10-CM | POA: Diagnosis not present

## 2016-10-23 DIAGNOSIS — H43813 Vitreous degeneration, bilateral: Secondary | ICD-10-CM | POA: Diagnosis not present

## 2016-10-23 DIAGNOSIS — H2513 Age-related nuclear cataract, bilateral: Secondary | ICD-10-CM | POA: Diagnosis not present

## 2016-11-03 ENCOUNTER — Other Ambulatory Visit: Payer: Self-pay | Admitting: Internal Medicine

## 2017-01-01 ENCOUNTER — Ambulatory Visit (INDEPENDENT_AMBULATORY_CARE_PROVIDER_SITE_OTHER): Payer: BLUE CROSS/BLUE SHIELD | Admitting: Neurology

## 2017-01-01 ENCOUNTER — Encounter: Payer: Self-pay | Admitting: Neurology

## 2017-01-01 ENCOUNTER — Other Ambulatory Visit (INDEPENDENT_AMBULATORY_CARE_PROVIDER_SITE_OTHER): Payer: BLUE CROSS/BLUE SHIELD

## 2017-01-01 ENCOUNTER — Telehealth: Payer: Self-pay | Admitting: Neurology

## 2017-01-01 VITALS — BP 148/84 | HR 80 | Ht 61.0 in | Wt 176.6 lb

## 2017-01-01 DIAGNOSIS — H5462 Unqualified visual loss, left eye, normal vision right eye: Secondary | ICD-10-CM

## 2017-01-01 DIAGNOSIS — R51 Headache: Secondary | ICD-10-CM | POA: Diagnosis not present

## 2017-01-01 DIAGNOSIS — R519 Headache, unspecified: Secondary | ICD-10-CM

## 2017-01-01 DIAGNOSIS — H539 Unspecified visual disturbance: Secondary | ICD-10-CM

## 2017-01-01 LAB — C-REACTIVE PROTEIN: CRP: 0.1 mg/dL — AB (ref 0.5–20.0)

## 2017-01-01 LAB — SEDIMENTATION RATE: Sed Rate: 13 mm/hr (ref 0–30)

## 2017-01-01 NOTE — Telephone Encounter (Signed)
LMOM making patient aware labs normal.

## 2017-01-01 NOTE — Telephone Encounter (Signed)
-----   Message from Donaldson, DO sent at 01/01/2017  4:14 PM EDT ----- Let pt know that labs are okay

## 2017-01-01 NOTE — Progress Notes (Signed)
NEUROLOGY CONSULTATION NOTE  Mary Collier MRN: [id number] DOB: 1951/05/12  Referring provider: Dr. Rosana Hoes Primary care provider: Dr. Eulas Post  Reason for consult:  Headache, visual disturbance  HISTORY OF PRESENT ILLNESS: Mary Collier is a 66 year old female hypertension and GERD who presents for headache.  She is accompanied by her sister, who supplements history.  History supplemented by ophthalmologist note.  She has remote history of headaches and generalized tonic clonic seizures as a teenager, which subsequently resolved.  About 7 or 8 years ago, she developed a new headache, described as a sharp right sided retro-orbital pain, 10/10, lasting 3 to 10 minutes.  There is no associated symptoms such as visual disturbance (specific to these attacks), unilateral facial numbness/weakness, nausea, vomiting, dizziness or focal unilateral weakness or numbness of the body.  They typically occurred once a day but then became several times daily over the past 3 or 4 months.  There are no triggers.  Laying down and applying warm compresses helps.  She takes Tylenol.  She was previously treated at the [facility] and was diagnosed with ocular migraines.  About 3 years ago, she developed gradual onset of vision loss and visual disturbance.  People and objects look like shadows and she sees double or triple.  When she reads, she sees several copies of the words stacked on top of each other.  She also sees floaters.  Occasionally, she sees a small yellow dot in the vision of her left eye that is intermittent, lasting just seconds.  She has been followed by [facility] for several years for glaucoma.  She reports that she initially saw Dr. Jolyn Nap, neuro-ophthalmologist at [facility], a few years ago, but notes are not available.  She does not remember his assessment. Due to persistent visual disturbance, she was evaluated by Dr. Tempie Hoist, a retinal specialist, on 10/23/16. On  [facility] visual field testing, she exhibited severe constriction bilaterally.  Retinal exam revealed grade II hypertensive retinopathy with minimal vitreous opacities.  It was suggested that her visual field defect may be related to her migraines but not certain.    She had a remote CT of the head from 03/29/02 to evaluate syncope, which was normal.  On 03/31/13, Sed Rate was 29.  On 03/23/16, Hgb A1c was 6.2.  There is no family history of headaches or cerebral aneurysms.  PAST MEDICAL HISTORY: Past Medical History:  Diagnosis Date  . Allergic rhinitis   . Allergy   . Arthritis   . Asthma   . Blood transfusion without reported diagnosis    1966  . Cancer ([place])    at age 20.  abdominal  . Cancer of kidney ([place])    at age 53.  Right kidney.  . Chronic headache   . Edema   . GERD (gastroesophageal reflux disease)   . Glaucoma   . Hiatal hernia   . Hypertension   . IBS (irritable bowel syndrome)   . Internal hemorrhoid   . Lumbago   . Lump or mass in breast   . Lung cancer ([place])    at age 60  Right lung.  . Other abnormal blood chemistry   . Other and unspecified hyperlipidemia   . Other convulsions    hx seizures at age 89  . Peptic stricture of esophagus   . Reflux esophagitis   . Tubular adenoma of colon 2015  . Unspecified menopausal and postmenopausal disorder   . Unspecified vitamin D deficiency  PAST SURGICAL HISTORY: Past Surgical History:  Procedure Laterality Date  . ABDOMINAL SURGERY  at age 22   . BREAST SURGERY  1970s and 1980s   Bilateral cysts removed  . KIDNEY SURGERY  at age 39   Right  . LUNG SURGERY  at age 47   Right  . NASAL SINUS SURGERY  1980s  . TONSILLECTOMY  1970  . TOTAL ABDOMINAL HYSTERECTOMY  2000s    MEDICATIONS: Current Outpatient Prescriptions on File Prior to Visit  Medication Sig Dispense Refill  . acetaminophen (TYLENOL) 325 MG tablet Take 325 mg by mouth as needed.      Marland Kitchen atorvastatin (LIPITOR) 10 MG tablet TAKE 1 TABLET (10  MG TOTAL) BY MOUTH DAILY. 90 tablet 1  . bimatoprost (LUMIGAN) 0.03 % ophthalmic solution Place 1 drop into both eyes at bedtime.      . calcium-vitamin D (OSCAL 500/200 D-3) 500-200 MG-UNIT per tablet Take 1 tablet by mouth 2 (two) times daily. 60 tablet 12  . CVS E 200 units capsule Take 200 Units by mouth daily.  0  . CVS SENNA PLUS 8.6-50 MG tablet TAKE 1 TABLET BY MOUTH DAILY. (Patient taking differently: TAKE 1 TABLET BY MOUTH daily as needed.) 90 tablet 1  . folic acid (FOLVITE) 1 MG tablet Take 1 mg by mouth daily.    . hydrocortisone (ANUSOL-HC) 25 MG suppository PLACE 1 SUPPOSITORY PER RECTUM TWICE A DAY 28 suppository 2  . metoprolol succinate (TOPROL-XL) 25 MG 24 hr tablet TAKE 1 TABLET BY MOUTH EVERY DAY 90 tablet 1  . omeprazole (PRILOSEC) 40 MG capsule Take 1 capsule (40 mg total) by mouth 2 (two) times daily. 180 capsule 1  . ondansetron (ZOFRAN) 4 MG tablet Take 1 tablet (4 mg total) by mouth every 6 (six) hours as needed for nausea or vomiting. 20 tablet 0  . dicyclomine (BENTYL) 10 MG capsule Take 1 capsule (10 mg total) by mouth 2 (two) times daily as needed for spasms. Increase fluid intake with this medication. (Patient not taking: Reported on 01/01/2017) 60 capsule 0   No current facility-administered medications on file prior to visit.     ALLERGIES: Allergies  Allergen Reactions  . Influenza Vaccines     D/t being allergic to eggs  . Celecoxib     Able to take IBU  . Codeine Swelling  . Compazine [Prochlorperazine]   . Peanut-Containing Drug Products     Itching  . Prednisone     Rapid heart rate  . Prochlorperazine Edisylate     paralyzed  . Vioxx [Rofecoxib]     States able to take IBU  . Aspirin Rash    States able to take IBU  . Eggs Or Egg-Derived Products Rash  . Epinephrine Rash  . Erythromycin Rash  . Sulfa Antibiotics Rash  . Tetracycline Rash    FAMILY HISTORY: Family History  Problem Relation Age of Onset  . Allergies Sister     x3  .  Asthma Sister   . Bone cancer Father   . Lung cancer Father   . Stomach cancer Father   . Esophageal cancer Father   . Breast cancer Mother   . Cancer Mother   . Cancer Brother   . Hypertension Brother   . Prostate cancer Brother   . Diabetes Brother   . Heart disease Sister   . Diabetes Sister   . Hypertension Sister   . Breast cancer Sister     mastectomy  . Stroke  Sister   . Heart disease Other     2 siblings  . Colon cancer Neg Hx   . Rectal cancer Neg Hx   . Liver cancer Neg Hx     SOCIAL HISTORY: Social History   Social History  . Marital status: Married    Spouse name: N/A  . Number of children: 0  . Years of education: N/A   Occupational History  . education specialist (preschool)     Social History Main Topics  . Smoking status: Never Smoker  . Smokeless tobacco: Never Used  . Alcohol use No  . Drug use: No  . Sexual activity: Not on file   Other Topics Concern  . Not on file   Social History Narrative   Pt has 18 siblings.   No caffeine drinks     REVIEW OF SYSTEMS: Constitutional: No fevers, chills, or sweats, no generalized fatigue, change in appetite Eyes: No visual changes, double vision, eye pain Ear, nose and throat: No hearing loss, ear pain, nasal congestion, sore throat Cardiovascular: No chest pain, palpitations Respiratory:  No shortness of breath at rest or with exertion, wheezes GastrointestinaI: No nausea, vomiting, diarrhea, abdominal pain, fecal incontinence Genitourinary:  No dysuria, urinary retention or frequency Musculoskeletal:  No neck pain, back pain Integumentary: No rash, pruritus, skin lesions Neurological: as above Psychiatric: No depression, insomnia, anxiety Endocrine: No palpitations, fatigue, diaphoresis, mood swings, change in appetite, change in weight, increased thirst Hematologic/Lymphatic:  No purpura, petechiae. Allergic/Immunologic: no itchy/runny eyes, nasal congestion, recent allergic reactions,  rashes  PHYSICAL EXAM: Vitals:   01/01/17 1107  BP: (!) 148/84  Pulse: 80   General: No acute distress.  Patient appears well-groomed.  Head:  Normocephalic/atraumatic Eyes:  fundi examined but not visualized Neck: supple, no paraspinal tenderness, full range of motion Back: No paraspinal tenderness Heart: regular rate and rhythm Lungs: Clear to auscultation bilaterally. Vascular: No carotid bruits. Neurological Exam: Mental status: alert and oriented to person, place, and time, recent and remote memory intact, fund of knowledge intact, attention and concentration intact, speech fluent and not dysarthric, language intact. Cranial nerves: CN I: not tested CN II: pupils equal, round and reactive to light, vision loss in left eye, vision clear in right eye.  When looking at me, she reports seeing only my silhouette She reports diplopia which resolves when closing either eye. CN III, IV, VI:  full range of motion, no nystagmus, no ptosis CN V: facial sensation intact CN VII: upper and lower face symmetric CN VIII: hearing intact CN IX, X: gag intact, uvula midline CN XI: sternocleidomastoid and trapezius muscles intact CN XII: tongue midline Bulk & Tone: normal, no fasciculations. Motor:  5/5 throughout  Sensation: temperature and vibration sensation intact. Deep Tendon Reflexes:  2+ throughout, toes downgoing.  Finger to nose testing:  Without dysmetria.  Heel to shin:  Without dysmetria.  Gait:  Normal station and stride.  Able to turn and tandem walk. Romberg negative.  IMPRESSION: 1.  Right retro-orbital pain.  Not consistent with ocular migraine as they are not associated with episodic visual aura.  Not consistent with migraine.  Primary stabbing headache possible. 2.  Visual disturbance.  She reports binocular diplopia, floaters, and shadows which makes it difficult to recognize people.  On visual field testing with the ophthalmologist, she exhibited bilateral vision loss, but  today she only endorsed vision loss in her left eye.  I don't think this is migraine.  PLAN: 1.  We will get MRI  of brain with and without contrast 2.  We will check Sed Rate and CRP. 3.  For headache treatment, I recommended nortriptyline.  She wants to think about it. 4.  We will try to get notes from Dr. Jolyn Nap. 5.  Further recommendations pending labs and MRI.  Follow up after testing.  Thank you for allowing me to take part in the care of this patient.  Metta Clines, DO  CC:  Gildardo Cranker, DO  [place], [place]

## 2017-01-01 NOTE — Patient Instructions (Addendum)
1.  We will get MRI of brain with and without contrast 2.  We will check Sed Rate and CRP 3.  Follow up after testing. 4.  For headache prevention, consider an antidepressant such as nortriptyline

## 2017-01-21 ENCOUNTER — Ambulatory Visit
Admission: RE | Admit: 2017-01-21 | Discharge: 2017-01-21 | Disposition: A | Discharge status: Home / Self Care | Payer: BLUE CROSS/BLUE SHIELD | Source: Ambulatory Visit | Attending: Neurology | Admitting: Neurology

## 2017-01-21 DIAGNOSIS — H539 Unspecified visual disturbance: Secondary | ICD-10-CM

## 2017-01-21 DIAGNOSIS — R519 Headache, unspecified: Secondary | ICD-10-CM

## 2017-01-21 DIAGNOSIS — H5462 Unqualified visual loss, left eye, normal vision right eye: Secondary | ICD-10-CM

## 2017-01-21 DIAGNOSIS — R51 Headache: Secondary | ICD-10-CM

## 2017-01-21 MED ORDER — GADOBENATE DIMEGLUMINE 529 MG/ML IV SOLN
15.0000 mL | Freq: Once | INTRAVENOUS | Status: AC | PRN
  Administered 2017-01-21: 15 mL via INTRAVENOUS

## 2017-01-23 ENCOUNTER — Telehealth: Payer: Self-pay

## 2017-01-23 NOTE — Telephone Encounter (Signed)
-----   Message from Pieter Partridge, DO sent at 01/22/2017  6:57 AM EDT ----- I reviewed the MRI of the brain and it is unremarkable.  It reveals no cause for her vision loss.  I have yet to see the records from Dr. Jolyn Nap, but if these are different visual symptoms, it may be a good idea to see him again, as I can only recommend evaluation by neuro-ophthalmology. As far as the headaches are concerned, we can start nortriptyline '25mg'$  at bedtime (as discussed) and she may make a follow up in 3 months (she can contact us in one month with headache update and we can increase dose if needed).

## 2017-01-26 NOTE — Telephone Encounter (Signed)
Spoke to patient. Went over results. Pt verbalized understanding. Patient stated she does not want to try Nortriptyline '25mg'$  at this time b/c it is an antidepressant also. Advised would make Dr. Tomi Likens aware.

## 2017-01-30 ENCOUNTER — Other Ambulatory Visit: Payer: Self-pay | Admitting: Internal Medicine

## 2017-03-18 ENCOUNTER — Other Ambulatory Visit: Payer: Self-pay | Admitting: Internal Medicine

## 2017-03-25 ENCOUNTER — Encounter: Payer: Self-pay | Admitting: Internal Medicine

## 2017-03-25 ENCOUNTER — Ambulatory Visit (INDEPENDENT_AMBULATORY_CARE_PROVIDER_SITE_OTHER): Payer: BLUE CROSS/BLUE SHIELD | Admitting: Internal Medicine

## 2017-03-25 VITALS — BP 124/78 | HR 67 | Temp 97.7°F | Ht 61.0 in | Wt 175.4 lb

## 2017-03-25 DIAGNOSIS — R194 Change in bowel habit: Secondary | ICD-10-CM

## 2017-03-25 DIAGNOSIS — M545 Low back pain, unspecified: Secondary | ICD-10-CM

## 2017-03-25 DIAGNOSIS — R1032 Left lower quadrant pain: Secondary | ICD-10-CM

## 2017-03-25 DIAGNOSIS — R110 Nausea: Secondary | ICD-10-CM | POA: Diagnosis not present

## 2017-03-25 DIAGNOSIS — K219 Gastro-esophageal reflux disease without esophagitis: Secondary | ICD-10-CM | POA: Diagnosis not present

## 2017-03-25 DIAGNOSIS — Z86010 Personal history of colonic polyps: Secondary | ICD-10-CM | POA: Diagnosis not present

## 2017-03-25 LAB — COMPLETE METABOLIC PANEL WITH GFR
ALBUMIN: 4.1 g/dL (ref 3.6–5.1)
ALT: 16 U/L (ref 6–29)
AST: 17 U/L (ref 10–35)
Alkaline Phosphatase: 84 U/L (ref 33–130)
BUN: 18 mg/dL (ref 7–25)
CALCIUM: 9 mg/dL (ref 8.6–10.4)
CHLORIDE: 108 mmol/L (ref 98–110)
CO2: 25 mmol/L (ref 20–31)
Creat: 1.04 mg/dL — ABNORMAL HIGH (ref 0.50–0.99)
GFR, Est African American: 65 mL/min (ref >=60)
GFR, Est Non African American: 57 mL/min — ABNORMAL LOW (ref >=60)
GLUCOSE: 87 mg/dL (ref 65–99)
POTASSIUM: 4.3 mmol/L (ref 3.5–5.3)
SODIUM: 141 mmol/L (ref 135–146)
Total Bilirubin: 0.4 mg/dL (ref 0.2–1.2)
Total Protein: 7.5 g/dL (ref 6.1–8.1)

## 2017-03-25 LAB — CBC WITH DIFFERENTIAL/PLATELET
Basophils Absolute: 0 cells/uL (ref 0–200)
Basophils Relative: 0 %
Eosinophils Absolute: 75 cells/uL (ref 15–500)
Eosinophils Relative: 1 %
HEMATOCRIT: 37.1 % (ref 35.0–45.0)
HEMOGLOBIN: 12.2 g/dL (ref 11.7–15.5)
LYMPHS ABS: 1725 {cells}/uL (ref 850–3900)
Lymphocytes Relative: 23 %
MCH: 29.2 pg (ref 27.0–33.0)
MCHC: 32.9 g/dL (ref 32.0–36.0)
MCV: 88.8 fL (ref 80.0–100.0)
MONO ABS: 675 {cells}/uL (ref 200–950)
MPV: 10.5 fL (ref 7.5–12.5)
Monocytes Relative: 9 %
NEUTROS PCT: 67 %
Neutro Abs: 5025 cells/uL (ref 1500–7800)
Platelets: 246 10*3/uL (ref 140–400)
RBC: 4.18 MIL/uL (ref 3.80–5.10)
RDW: 13.6 % (ref 11.0–15.0)
WBC: 7.5 10*3/uL (ref 3.8–10.8)

## 2017-03-25 NOTE — Progress Notes (Signed)
Patient ID: Mary Collier, female   DOB: 04/10/1951, 66 y.o.   MRN: [id number]     Location:  [facility] Place of Service: OFFICE  Chief Complaint  Patient presents with  . Acute Visit    left side pain, and rectal pain and diareah, and when stool is solid unable to hold stool    HPI:  66 yo female seen today for rectal pain. Last colonoscopy in 2015 showed internal hemorrhoids and 2 polyps removed with path of tubular adenoma. Pain in rectum x 30-45 min before it subsides. Tried rectal suppositories with temporary relief. She noticed stools are "runny". She has watery stool within 15 min of eating any kind of food. She has stool leakage. No bloody stool but they do appear yellow in color. No emesis but has nausea. She has LLQ pain intermittently, decreased appetite. She reports sx's x 1 yr. She was unable to be seen sooner due to multiple family deaths.  She c/o left lower back "soreness" x several yrs intermittent but now she cannot sleep on left side. She has increased urinary frequency and urgency but no dysuria, hematuria.  Prediabetes - attempts to make healthy food choices. Last A1c 6.2%  HTN - BP stable on metoprolol  GERD - sx's stable on omeprazole. Constipation stable om senna and miralax  Hyperlipidemia - stable on lipitor. LDL 96  Glaucoma - stable on lumigan. Followed by eye specialist  Left breast pain - last mammogram/US in 2016. B/l breast MRI in 06/2016 revealed no evidence of malignancy. FHx breast CA. She does monthly self breast exam  Past Medical History:  Diagnosis Date  . Allergic rhinitis   . Allergy   . Arthritis   . Asthma   . Blood transfusion without reported diagnosis    1966  . Cancer ([place])    at age 32.  abdominal  . Cancer of kidney ([place])    at age 39.  Right kidney.  . Chronic headache   . Edema   . GERD (gastroesophageal reflux disease)   . Glaucoma   . Hiatal hernia   . Hypertension   . IBS (irritable bowel syndrome)   . Internal  hemorrhoid   . Lumbago   . Lump or mass in breast   . Lung cancer ([place])    at age 78  Right lung.  . Other abnormal blood chemistry   . Other and unspecified hyperlipidemia   . Other convulsions    hx seizures at age 74  . Peptic stricture of esophagus   . Reflux esophagitis   . Tubular adenoma of colon 2015  . Unspecified menopausal and postmenopausal disorder   . Unspecified vitamin D deficiency     Past Surgical History:  Procedure Laterality Date  . ABDOMINAL SURGERY  at age 89   . BREAST SURGERY  1970s and 1980s   Bilateral cysts removed  . KIDNEY SURGERY  at age 87   Right  . LUNG SURGERY  at age 5   Right  . NASAL SINUS SURGERY  1980s  . TONSILLECTOMY  1970  . TOTAL ABDOMINAL HYSTERECTOMY  2000s    Patient Care Team: Gildardo Cranker, DO as PCP - General (Internal Medicine)  Social History   Social History  . Marital status: Married    Spouse name: N/A  . Number of children: 0  . Years of education: N/A   Occupational History  . education specialist (preschool)     Social History Main Topics  . Smoking  status: Never Smoker  . Smokeless tobacco: Never Used  . Alcohol use No  . Drug use: No  . Sexual activity: Not on file   Other Topics Concern  . Not on file   Social History Narrative   Pt has 18 siblings.   No caffeine drinks      reports that she has never smoked. She has never used smokeless tobacco. She reports that she does not drink alcohol or use drugs.  Family History  Problem Relation Age of Onset  . Allergies Sister        x3  . Asthma Sister   . Bone cancer Father   . Lung cancer Father   . Stomach cancer Father   . Esophageal cancer Father   . Breast cancer Mother   . Cancer Mother   . Cancer Brother   . Hypertension Brother   . Prostate cancer Brother   . Diabetes Brother   . Heart disease Sister   . Diabetes Sister   . Hypertension Sister   . Breast cancer Sister        mastectomy  . Stroke Sister   . Heart disease  Other        2 siblings  . Colon cancer Neg Hx   . Rectal cancer Neg Hx   . Liver cancer Neg Hx    Family Status  Relation Status  . Sister Alive  . Sister Alive  . Father Deceased  . Mother Deceased  . Brother Deceased  . Brother Alive  . Brother Alive  . Sister Alive  . Sister Alive  . Brother Alive  . Sister Alive  . Sister Alive  . Sister Alive  . Sister Alive  . Other (Not Specified)  . Brother Alive  . Neg Hx (Not Specified)     Allergies  Allergen Reactions  . Influenza Vaccines     D/t being allergic to eggs  . Shrimp [Shellfish Allergy] Swelling    Lip, throat, tongue swelling  . Celecoxib     Able to take IBU  . Codeine Swelling  . Compazine [Prochlorperazine]   . Peanut-Containing Drug Products     Itching  . Prednisone     Rapid heart rate  . Prochlorperazine Edisylate     paralyzed  . Vioxx [Rofecoxib]     States able to take IBU  . Aspirin Rash    States able to take IBU  . Eggs Or Egg-Derived Products Rash  . Epinephrine Rash  . Erythromycin Rash  . Sulfa Antibiotics Rash  . Tetracycline Rash    Medications: Patient's Medications  New Prescriptions   No medications on file  Previous Medications   ACETAMINOPHEN (TYLENOL) 325 MG TABLET    Take 325 mg by mouth as needed.     ATORVASTATIN (LIPITOR) 10 MG TABLET    TAKE 1 TABLET BY MOUTH EVERY DAY. PT NEEDS APPOINTMENT FOR FURTHER REFILL.   BIMATOPROST (LUMIGAN) 0.03 % OPHTHALMIC SOLUTION    Place 1 drop into both eyes at bedtime.     CALCIUM-VITAMIN D (OSCAL 500/200 D-3) 500-200 MG-UNIT PER TABLET    Take 1 tablet by mouth 2 (two) times daily.   CVS E 200 UNITS CAPSULE    Take 200 Units by mouth daily.   CVS SENNA PLUS 8.6-50 MG TABLET    TAKE 1 TABLET BY MOUTH DAILY.   DICYCLOMINE (BENTYL) 10 MG CAPSULE    Take 1 capsule (10 mg total) by mouth 2 (two) times  daily as needed for spasms. Increase fluid intake with this medication.   FOLIC ACID (FOLVITE) 1 MG TABLET    Take 1 mg by mouth  daily.   HYDROCORTISONE (ANUSOL-HC) 25 MG SUPPOSITORY    PLACE 1 SUPPOSITORY PER RECTUM TWICE A DAY   METOPROLOL SUCCINATE (TOPROL-XL) 25 MG 24 HR TABLET    TAKE 1 TABLET BY MOUTH EVERY DAY   OMEPRAZOLE (PRILOSEC) 40 MG CAPSULE    Take 1 capsule (40 mg total) by mouth 2 (two) times daily.   ONDANSETRON (ZOFRAN) 4 MG TABLET    Take 1 tablet (4 mg total) by mouth every 6 (six) hours as needed for nausea or vomiting.  Modified Medications   No medications on file  Discontinued Medications   No medications on file    Review of Systems  Constitutional: Positive for appetite change. Negative for fatigue.  Gastrointestinal: Positive for abdominal pain, diarrhea, nausea and rectal pain. Negative for anal bleeding.  Genitourinary: Positive for frequency and urgency.  Musculoskeletal: Positive for back pain.  All other systems reviewed and are negative.   Vitals:   03/25/17 1108  BP: 124/78  Pulse: 67  Temp: 97.7 F (36.5 C)  TempSrc: Oral  SpO2: 97%  Weight: 175 lb 6.4 oz (79.6 kg)  Height: 5' 1" (1.549 m)   Body mass index is 33.14 kg/m.  Physical Exam  Constitutional: She is oriented to person, place, and time. She appears well-developed and well-nourished.  HENT:  Mouth/Throat: Oropharynx is clear and moist. No oropharyngeal exudate.  Eyes: Pupils are equal, round, and reactive to light. No scleral icterus.  Neck: Neck supple. Carotid bruit is not present. No tracheal deviation present.  Cardiovascular: Normal rate, regular rhythm, normal heart sounds and intact distal pulses.  Exam reveals no gallop and no friction rub.   No murmur heard. No LE edema b/l. no calf TTP.   Pulmonary/Chest: Effort normal and breath sounds normal. No stridor. No respiratory distress. She has no wheezes. She has no rales.  Abdominal: Soft. Bowel sounds are normal. She exhibits no distension and no mass. There is no hepatosplenomegaly or hepatomegaly. There is tenderness (epigastric, LUQ and LLQ).  There is CVA tenderness. There is no rebound and no guarding.  Genitourinary: Rectal exam shows external hemorrhoid (no bleeding). Rectal exam shows no internal hemorrhoid, no fissure, no mass, no tenderness, anal tone normal and guaiac negative stool.  Musculoskeletal: She exhibits edema and tenderness.       Lumbar back: She exhibits decreased range of motion, tenderness, bony tenderness, swelling and spasm.       Back:  Lymphadenopathy:    She has no cervical adenopathy.  Neurological: She is alert and oriented to person, place, and time.  Skin: Skin is warm and dry. No rash noted.  Psychiatric: She has a normal mood and affect. Her behavior is normal. Judgment and thought content normal.     Labs reviewed: Lab on 01/01/2017  Component Date Value Ref Range Status  . Sed Rate 01/01/2017 13  0 - 30 mm/hr Final  . CRP 01/01/2017 0.1* 0.5 - 20.0 mg/dL Final    No results found.   Assessment/Plan   ICD-10-CM   1. Left lower quadrant pain R10.32 CBC with Differential/Platelets    CMP with eGFR    CT Abdomen Pelvis W Contrast    CANCELED: CT Abdomen Pelvis Wo Contrast   but also LUQ and epigastric  2. Change in bowel habits R19.4 CBC with Differential/Platelets  CMP with eGFR    CT Abdomen Pelvis W Contrast    CANCELED: CT Abdomen Pelvis Wo Contrast  3. Left-sided low back pain without sciatica, unspecified chronicity M54.5 CBC with Differential/Platelets    CMP with eGFR    Urinalysis with Reflex Microscopic    DG Lumbar Spine Complete    CANCELED: CT Abdomen Pelvis Wo Contrast  4. Nausea R11.0 CMP with eGFR    CT Abdomen Pelvis W Contrast  5. History of adenomatous polyp of colon Z86.010   6. Gastroesophageal reflux disease, esophagitis presence not specified K21.9    Will call with xray and CT results  Will call with lab results  Go to the ER if pain worsens or you notice bloody stools  Follow up in 2 weeks for abdominal pain and change in stool    Quinlyn Tep S.  Perlie Gold  [facility] [address] [place], [place] [address] [phone] Cell (Monday-Friday 8 AM - 5 PM) [phone] After 5 PM and follow prompts

## 2017-03-25 NOTE — Patient Instructions (Signed)
Will call with xray and CT results  Will call with lab results  Go to the ER if pain worsens or you notice bloody stools  Follow up in 2 weeks for abdominal pain and change in stool

## 2017-03-26 ENCOUNTER — Telehealth: Payer: Self-pay | Admitting: Internal Medicine

## 2017-03-26 LAB — URINALYSIS, ROUTINE W REFLEX MICROSCOPIC
BILIRUBIN URINE: NEGATIVE
GLUCOSE, UA: NEGATIVE
Hgb urine dipstick: NEGATIVE
Ketones, ur: NEGATIVE
Leukocytes, UA: NEGATIVE
Nitrite: NEGATIVE
Protein, ur: NEGATIVE
Specific Gravity, Urine: 1.028 (ref 1.001–1.035)
pH: 5.5 (ref 5.0–8.0)

## 2017-03-26 NOTE — Telephone Encounter (Signed)
Your request for Abdomen Pelvis - CT does not meet medical necessity criteria    You as the ordering physician have to option to conduct a peer to peer review  [facility]: [phone] Member# : [id number]   Please Advise,  Patient is scheduled for test at [facility] on Monday 03/30/17 If a peer to peer review is not done this will need to be canceled.

## 2017-03-27 NOTE — Telephone Encounter (Signed)
CT was approved by insurance without need of peer-peer

## 2017-03-30 ENCOUNTER — Ambulatory Visit
Admission: RE | Admit: 2017-03-30 | Discharge: 2017-03-30 | Disposition: A | Discharge status: Home / Self Care | Payer: BLUE CROSS/BLUE SHIELD | Source: Ambulatory Visit | Attending: Internal Medicine | Admitting: Internal Medicine

## 2017-03-30 DIAGNOSIS — R1032 Left lower quadrant pain: Secondary | ICD-10-CM

## 2017-03-30 DIAGNOSIS — R194 Change in bowel habit: Secondary | ICD-10-CM

## 2017-03-30 DIAGNOSIS — R110 Nausea: Secondary | ICD-10-CM

## 2017-03-30 MED ORDER — IOPAMIDOL (ISOVUE-300) INJECTION 61%
100.0000 mL | Freq: Once | INTRAVENOUS | Status: AC | PRN
  Administered 2017-03-30: 100 mL via INTRAVENOUS

## 2017-03-31 ENCOUNTER — Other Ambulatory Visit: Payer: Self-pay | Admitting: *Deleted

## 2017-03-31 DIAGNOSIS — R1032 Left lower quadrant pain: Secondary | ICD-10-CM

## 2017-03-31 DIAGNOSIS — R110 Nausea: Secondary | ICD-10-CM

## 2017-03-31 NOTE — Progress Notes (Signed)
See CT Results dated 03/30/2017

## 2017-04-08 ENCOUNTER — Encounter: Payer: Self-pay | Admitting: Internal Medicine

## 2017-04-08 ENCOUNTER — Ambulatory Visit (INDEPENDENT_AMBULATORY_CARE_PROVIDER_SITE_OTHER): Payer: BLUE CROSS/BLUE SHIELD | Admitting: Internal Medicine

## 2017-04-08 VITALS — BP 132/84 | HR 58 | Temp 97.8°F | Ht 61.0 in | Wt 173.6 lb

## 2017-04-08 DIAGNOSIS — K219 Gastro-esophageal reflux disease without esophagitis: Secondary | ICD-10-CM

## 2017-04-08 DIAGNOSIS — R7303 Prediabetes: Secondary | ICD-10-CM | POA: Diagnosis not present

## 2017-04-08 DIAGNOSIS — R194 Change in bowel habit: Secondary | ICD-10-CM | POA: Diagnosis not present

## 2017-04-08 DIAGNOSIS — Z23 Encounter for immunization: Secondary | ICD-10-CM | POA: Diagnosis not present

## 2017-04-08 DIAGNOSIS — R112 Nausea with vomiting, unspecified: Secondary | ICD-10-CM | POA: Diagnosis not present

## 2017-04-08 DIAGNOSIS — R1084 Generalized abdominal pain: Secondary | ICD-10-CM

## 2017-04-08 DIAGNOSIS — I10 Essential (primary) hypertension: Secondary | ICD-10-CM

## 2017-04-08 LAB — LIPID PANEL
CHOL/HDL RATIO: 3.8 ratio (ref <5.0)
CHOLESTEROL: 139 mg/dL (ref <200)
HDL: 37 mg/dL — AB (ref >50)
LDL Cholesterol: 85 mg/dL (ref <100)
TRIGLYCERIDES: 85 mg/dL (ref <150)
VLDL: 17 mg/dL (ref <30)

## 2017-04-08 LAB — TSH: TSH: 3.54 mIU/L

## 2017-04-08 MED ORDER — TETANUS-DIPHTH-ACELL PERTUSSIS 5-2.5-18.5 LF-MCG/0.5 IM SUSP: 0.5000 mL | Freq: Once | INTRAMUSCULAR | 0 refills | Status: AC

## 2017-04-08 MED ORDER — ONDANSETRON HCL 4 MG PO TABS: 4.0000 mg | ORAL_TABLET | Freq: Three times a day (TID) | ORAL | 1 refills | Status: DC | PRN

## 2017-04-08 NOTE — Progress Notes (Signed)
Patient ID: Mary Collier, female   DOB: Jun 09, 1951, 66 y.o.   MRN: [id number]    Location:  [facility] Place of Service: OFFICE  Chief Complaint  Patient presents with  . Follow-up    2 week follow up for abdominal pain  . Immunizations    discuss pneu 23    HPI:  66 yo female seen today for f/u. She continues to have N/V/D. CT abd/pelvis neg for acute process; mild colonic diverticulosis. She has appt with GI Dr Fuller Plan soon. No f/c. No bloody stools. She as not taken bentyl since last fall and states no spasms. She does admit the medication makes her sleepy.   Prediabetes - attempts to make healthy food choices. Last A1c 6.2%  HTN - BP stable on metoprolol  GERD - sx's stable on omeprazole. Constipation stable om senna and miralax  Hyperlipidemia - stable on lipitor. LDL 96  Glaucoma - stable on lumigan. Followed by eye specialist  Left breast pain - last mammogram/US in 2016. B/l breast MRI in 06/2016 revealed no evidence of malignancy. FHx breast CA. She does monthly self breast exam. She is followed by her GYN. She plans to get DXA through GYN office    Past Medical History:  Diagnosis Date  . Allergic rhinitis   . Allergy   . Arthritis   . Asthma   . Blood transfusion without reported diagnosis    1966  . Cancer ([place])    at age 38.  abdominal  . Cancer of kidney ([place])    at age 61.  Right kidney.  . Chronic headache   . Edema   . GERD (gastroesophageal reflux disease)   . Glaucoma   . Hiatal hernia   . Hypertension   . IBS (irritable bowel syndrome)   . Internal hemorrhoid   . Lumbago   . Lump or mass in breast   . Lung cancer ([place])    at age 71  Right lung.  . Other abnormal blood chemistry   . Other and unspecified hyperlipidemia   . Other convulsions    hx seizures at age 46  . Peptic stricture of esophagus   . Reflux esophagitis   . Tubular adenoma of colon 2015  . Unspecified menopausal and postmenopausal disorder   . Unspecified vitamin D  deficiency     Past Surgical History:  Procedure Laterality Date  . ABDOMINAL SURGERY  at age 38   . BREAST SURGERY  1970s and 1980s   Bilateral cysts removed  . KIDNEY SURGERY  at age 55   Right  . LUNG SURGERY  at age 55   Right  . NASAL SINUS SURGERY  1980s  . TONSILLECTOMY  1970  . TOTAL ABDOMINAL HYSTERECTOMY  2000s    Patient Care Team: Gildardo Cranker, DO as PCP - General (Internal Medicine)  Social History   Social History  . Marital status: Married    Spouse name: N/A  . Number of children: 0  . Years of education: N/A   Occupational History  . education specialist (preschool)     Social History Main Topics  . Smoking status: Never Smoker  . Smokeless tobacco: Never Used  . Alcohol use No  . Drug use: No  . Sexual activity: Not on file   Other Topics Concern  . Not on file   Social History Narrative   Pt has 18 siblings.   No caffeine drinks      reports that she has never smoked.  She has never used smokeless tobacco. She reports that she does not drink alcohol or use drugs.  Family History  Problem Relation Age of Onset  . Allergies Sister        x3  . Asthma Sister   . Bone cancer Father   . Lung cancer Father   . Stomach cancer Father   . Esophageal cancer Father   . Breast cancer Mother   . Cancer Mother   . Cancer Brother   . Hypertension Brother   . Prostate cancer Brother   . Diabetes Brother   . Heart disease Sister   . Diabetes Sister   . Hypertension Sister   . Breast cancer Sister        mastectomy  . Stroke Sister   . Heart disease Other        2 siblings  . Colon cancer Neg Hx   . Rectal cancer Neg Hx   . Liver cancer Neg Hx    Family Status  Relation Status  . Sister Alive  . Sister Alive  . Father Deceased  . Mother Deceased  . Brother Deceased  . Brother Alive  . Brother Alive  . Sister Alive  . Sister Alive  . Brother Alive  . Sister Alive  . Sister Alive  . Sister Alive  . Sister Alive  . Other (Not  Specified)  . Brother Alive  . Neg Hx (Not Specified)     Allergies  Allergen Reactions  . Influenza Vaccines     D/t being allergic to eggs  . Shrimp [Shellfish Allergy] Swelling    Lip, throat, tongue swelling  . Celecoxib     Able to take IBU  . Codeine Swelling  . Compazine [Prochlorperazine]   . Peanut-Containing Drug Products     Itching  . Prednisone     Rapid heart rate  . Prochlorperazine Edisylate     paralyzed  . Vioxx [Rofecoxib]     States able to take IBU  . Aspirin Rash    States able to take IBU  . Eggs Or Egg-Derived Products Rash  . Epinephrine Rash  . Erythromycin Rash  . Sulfa Antibiotics Rash  . Tetracycline Rash    Medications: Patient's Medications  New Prescriptions   No medications on file  Previous Medications   ACETAMINOPHEN (TYLENOL) 325 MG TABLET    Take 325 mg by mouth as needed.     ATORVASTATIN (LIPITOR) 10 MG TABLET    TAKE 1 TABLET BY MOUTH EVERY DAY. PT NEEDS APPOINTMENT FOR FURTHER REFILL.   BIMATOPROST (LUMIGAN) 0.03 % OPHTHALMIC SOLUTION    Place 1 drop into both eyes at bedtime.     CALCIUM-VITAMIN D (OSCAL 500/200 D-3) 500-200 MG-UNIT PER TABLET    Take 1 tablet by mouth 2 (two) times daily.   [employer] E 200 UNITS CAPSULE    Take 200 Units by mouth daily.   [employer] SENNA PLUS 8.6-50 MG TABLET    TAKE 1 TABLET BY MOUTH DAILY.   FOLIC ACID (FOLVITE) 1 MG TABLET    Take 1 mg by mouth daily.   HYDROCORTISONE (ANUSOL-HC) 25 MG SUPPOSITORY    PLACE 1 SUPPOSITORY PER RECTUM TWICE A DAY   METOPROLOL SUCCINATE (TOPROL-XL) 25 MG 24 HR TABLET    TAKE 1 TABLET BY MOUTH EVERY DAY   OMEPRAZOLE (PRILOSEC) 40 MG CAPSULE    Take 1 capsule (40 mg total) by mouth 2 (two) times daily.  Modified Medications   Modified Medication  Previous Medication   TDAP (BOOSTRIX) 5-2.5-18.5 LF-MCG/0.5 INJECTION Tdap (BOOSTRIX) 5-2.5-18.5 LF-MCG/0.5 injection      Inject 0.5 mLs into the muscle once.    Inject 0.5 mLs into the muscle once.  Discontinued Medications    DICYCLOMINE (BENTYL) 10 MG CAPSULE    Take 1 capsule (10 mg total) by mouth 2 (two) times daily as needed for spasms. Increase fluid intake with this medication.   ONDANSETRON (ZOFRAN) 4 MG TABLET    Take 1 tablet (4 mg total) by mouth every 6 (six) hours as needed for nausea or vomiting.    Review of Systems  Constitutional: Positive for appetite change. Negative for fatigue.  Gastrointestinal: Positive for abdominal pain, diarrhea, nausea, rectal pain and vomiting. Negative for anal bleeding.  Genitourinary: Positive for frequency and urgency.  Musculoskeletal: Positive for back pain.  All other systems reviewed and are negative.   Vitals:   04/08/17 0826  BP: 132/84  Pulse: (!) 58  Temp: 97.8 F (36.6 C)  TempSrc: Oral  SpO2: 98%  Weight: 173 lb 9.6 oz (78.7 kg)  Height: 5' 1" (1.549 m)   Body mass index is 32.8 kg/m.  Physical Exam  Constitutional: She is oriented to person, place, and time. She appears well-developed and well-nourished.  HENT:  Mouth/Throat: Oropharynx is clear and moist. No oropharyngeal exudate.  Eyes: Pupils are equal, round, and reactive to light. No scleral icterus.  Neck: Neck supple. Carotid bruit is not present. No tracheal deviation present.  Cardiovascular: Normal rate, regular rhythm, normal heart sounds and intact distal pulses.  Exam reveals no gallop and no friction rub.   No murmur heard. No LE edema b/l. no calf TTP.   Pulmonary/Chest: Effort normal and breath sounds normal. No stridor. No respiratory distress. She has no wheezes. She has no rales.  Abdominal: Soft. Bowel sounds are normal. She exhibits no distension and no mass. There is no hepatosplenomegaly or hepatomegaly. There is tenderness (generalized but especially epigastric ). There is no rebound, no guarding and no CVA tenderness.  Musculoskeletal: She exhibits edema and tenderness.       Lumbar back: She exhibits decreased range of motion, tenderness, bony tenderness, swelling  and spasm.       Back:  Lymphadenopathy:    She has no cervical adenopathy.  Neurological: She is alert and oriented to person, place, and time.  Skin: Skin is warm and dry. No rash noted.  Psychiatric: She has a normal mood and affect. Her behavior is normal. Judgment and thought content normal.     Labs reviewed: Office Visit on 03/25/2017  Component Date Value Ref Range Status  . WBC 03/25/2017 7.5  3.8 - 10.8 K/uL Final  . RBC 03/25/2017 4.18  3.80 - 5.10 MIL/uL Final  . Hemoglobin 03/25/2017 12.2  11.7 - 15.5 g/dL Final  . HCT 03/25/2017 37.1  35.0 - 45.0 % Final  . MCV 03/25/2017 88.8  80.0 - 100.0 fL Final  . MCH 03/25/2017 29.2  27.0 - 33.0 pg Final  . MCHC 03/25/2017 32.9  32.0 - 36.0 g/dL Final  . RDW 03/25/2017 13.6  11.0 - 15.0 % Final  . Platelets 03/25/2017 246  140 - 400 K/uL Final  . MPV 03/25/2017 10.5  7.5 - 12.5 fL Final  . Neutro Abs 03/25/2017 5025  1,500 - 7,800 cells/uL Final  . Lymphs Abs 03/25/2017 1725  850 - 3,900 cells/uL Final  . Monocytes Absolute 03/25/2017 675  200 - 950 cells/uL Final  .  Eosinophils Absolute 03/25/2017 75  15 - 500 cells/uL Final  . Basophils Absolute 03/25/2017 0  0 - 200 cells/uL Final  . Neutrophils Relative % 03/25/2017 67  % Final  . Lymphocytes Relative 03/25/2017 23  % Final  . Monocytes Relative 03/25/2017 9  % Final  . Eosinophils Relative 03/25/2017 1  % Final  . Basophils Relative 03/25/2017 0  % Final  . Smear Review 03/25/2017 Criteria for review not met   Final  . Sodium 03/25/2017 141  135 - 146 mmol/L Final  . Potassium 03/25/2017 4.3  3.5 - 5.3 mmol/L Final  . Chloride 03/25/2017 108  98 - 110 mmol/L Final  . CO2 03/25/2017 25  20 - 31 mmol/L Final  . Glucose, Bld 03/25/2017 87  65 - 99 mg/dL Final  . BUN 03/25/2017 18  7 - 25 mg/dL Final  . Creat 03/25/2017 1.04* 0.50 - 0.99 mg/dL Final   Comment:   For patients > or = 65 years of age: The upper reference limit for Creatinine is approximately 13% higher  for people identified as African-American.     . Total Bilirubin 03/25/2017 0.4  0.2 - 1.2 mg/dL Final  . Alkaline Phosphatase 03/25/2017 84  33 - 130 U/L Final  . AST 03/25/2017 17  10 - 35 U/L Final  . ALT 03/25/2017 16  6 - 29 U/L Final  . Total Protein 03/25/2017 7.5  6.1 - 8.1 g/dL Final  . Albumin 03/25/2017 4.1  3.6 - 5.1 g/dL Final  . Calcium 03/25/2017 9.0  8.6 - 10.4 mg/dL Final  . GFR, Est African American 03/25/2017 65  >=60 mL/min Final  . GFR, Est Non African American 03/25/2017 57* >=60 mL/min Final  . Color, Urine 03/25/2017 YELLOW  YELLOW Final  . APPearance 03/25/2017 CLEAR  CLEAR Final  . Specific Gravity, Urine 03/25/2017 1.028  1.001 - 1.035 Final  . pH 03/25/2017 5.5  5.0 - 8.0 Final  . Glucose, UA 03/25/2017 NEGATIVE  NEGATIVE Final  . Bilirubin Urine 03/25/2017 NEGATIVE  NEGATIVE Final  . Ketones, ur 03/25/2017 NEGATIVE  NEGATIVE Final  . Hgb urine dipstick 03/25/2017 NEGATIVE  NEGATIVE Final  . Protein, ur 03/25/2017 NEGATIVE  NEGATIVE Final  . Nitrite 03/25/2017 NEGATIVE  NEGATIVE Final  . Leukocytes, UA 03/25/2017 NEGATIVE  NEGATIVE Final    Ct Abdomen Pelvis W Contrast  Result Date: 03/30/2017 CLINICAL DATA:  Left lower quadrant pain and bowel changes. Epigastric pain for 1 year. History of abdominal surgery at age 32. Last colonoscopy in 2015. EXAM: CT ABDOMEN AND PELVIS WITH CONTRAST TECHNIQUE: Multidetector CT imaging of the abdomen and pelvis was performed using the standard protocol following bolus administration of intravenous contrast. CONTRAST:  116m ISOVUE-300 IOPAMIDOL (ISOVUE-300) INJECTION 61% COMPARISON:  05/05/2011 FINDINGS: Lower chest:  Mild subpleural scarring. Hepatobiliary: 9 mm cyst in the caudate lobe of the liver.No evidence of biliary obstruction or stone. Pancreas: Unremarkable. Spleen: Unremarkable. Adrenals/Urinary Tract: Negative adrenals. No hydronephrosis or stone. Small bilateral renal cortical cystic densities measuring up to 11  mm on the left. Unremarkable bladder. Stomach/Bowel: No bowel obstruction, noted narrowing, or inflammation. No appendicitis. Vascular/Lymphatic: No acute vascular abnormality. Mild atherosclerotic calcification of the iliacs. No mass or adenopathy. Reproductive:Hysterectomy.  Possible oophorectomies. Other: No ascites or pneumoperitoneum. Musculoskeletal: No acute or aggressive finding. Mild facet spurring. IMPRESSION: 1. No specific explanation for symptoms. 2. Mild colonic diverticulosis. 3. Iliac atherosclerosis. Electronically Signed   By: JMonte FantasiaM.D.   On: 03/30/2017 17:03  Assessment/Plan   ICD-10-CM   1. Change in bowel habits R19.4   2. Nausea and vomiting, intractability of vomiting not specified, unspecified vomiting type R11.2 ondansetron (ZOFRAN) 4 MG tablet  3. Generalized abdominal pain R10.84   4. Gastroesophageal reflux disease, esophagitis presence not specified K21.9   5. Essential hypertension I10 Lipid Panel    TSH  6. Prediabetes R73.03 Hemoglobin A1c    Lipid Panel    TSH  7. Need for 23-polyvalent pneumococcal polysaccharide vaccine Z23 Pneumococcal polysaccharide vaccine 23-valent greater than or equal to 2yo subcutaneous/IM     Pneumovax given today  Recommend you resume bentyl to determine if this helps your abdominal symptoms  Continue current medications as ordered  Will call with lab results  Follow up in 1 yr CPE. Fasting labs prior to appt (cbc w diff, cmp, lipid panel, tsh, ua)  Fidela Cieslak S. Perlie Gold  [facility] [address] [place], [place] [address] [phone] Cell (Monday-Friday 8 AM - 5 PM) [phone] After 5 PM and follow prompts

## 2017-04-08 NOTE — Patient Instructions (Addendum)
Pneumovax given today  Recommend you resume bentyl to determine if this helps your abdominal symptoms  Continue current medications as ordered  Will call with lab results  Follow up in 1 yr CPE

## 2017-04-09 LAB — HEMOGLOBIN A1C
HEMOGLOBIN A1C: 5.9 % — AB (ref <5.7)
Mean Plasma Glucose: 123 mg/dL

## 2017-04-27 ENCOUNTER — Other Ambulatory Visit: Payer: Self-pay | Admitting: *Deleted

## 2017-04-27 ENCOUNTER — Ambulatory Visit (INDEPENDENT_AMBULATORY_CARE_PROVIDER_SITE_OTHER): Payer: BLUE CROSS/BLUE SHIELD | Admitting: Gastroenterology

## 2017-04-27 ENCOUNTER — Encounter: Payer: Self-pay | Admitting: Gastroenterology

## 2017-04-27 ENCOUNTER — Encounter (INDEPENDENT_AMBULATORY_CARE_PROVIDER_SITE_OTHER): Payer: Self-pay

## 2017-04-27 VITALS — BP 144/86 | HR 68 | Ht 61.5 in | Wt 173.1 lb

## 2017-04-27 DIAGNOSIS — K219 Gastro-esophageal reflux disease without esophagitis: Secondary | ICD-10-CM | POA: Diagnosis not present

## 2017-04-27 DIAGNOSIS — K594 Anal spasm: Secondary | ICD-10-CM | POA: Diagnosis not present

## 2017-04-27 DIAGNOSIS — K580 Irritable bowel syndrome with diarrhea: Secondary | ICD-10-CM

## 2017-04-27 MED ORDER — GLYCOPYRROLATE 1 MG PO TABS: 1.0000 mg | ORAL_TABLET | Freq: Two times a day (BID) | ORAL | 11 refills | Status: DC

## 2017-04-27 MED ORDER — ATORVASTATIN CALCIUM 10 MG PO TABS: 10.0000 mg | ORAL_TABLET | Freq: Every day | ORAL | 3 refills | Status: DC

## 2017-04-27 NOTE — Patient Instructions (Signed)
We have sent the following medications to your pharmacy for you to pick up at your convenience: robinul.   You have been scheduled for an endoscopy. Please follow written instructions given to you at your visit today. If you use inhalers (even only as needed), please bring them with you on the day of your procedure. Your physician has requested that you go to www.startemmi.com and enter the access code given to you at your visit today. This web site gives a general overview about your procedure. However, you should still follow specific instructions given to you by our office regarding your preparation for the procedure.  Thank you for choosing me and [facility].  Pricilla Riffle. Dagoberto Ligas., MD., Marval Regal

## 2017-04-27 NOTE — Progress Notes (Signed)
    History of Present Illness: This is a 66 year old female who has had persistent problems with intermittent nausea and vomiting, left upper quadrant pain, left lower quadrant pain and diarrhea. She stopped taking dicyclomine as she felt her nausea and vomiting worsened on dicyclomine. Recent CT scan unremarkable as below. Recent blood work unremarkable except for an minimally elevated Cr. She notes nausea following almost every meal and vomiting following meals intermittently. She notes normal to loose bowel movements. Loose stools are associated with left upper quadrant and left lower quadrant pain. She notes her vomiting is more frequent when she is having episodes of diarrhea and abdominal pain. She notes intermittent episodes of rectal/perirectal pain that occur randomly and generally at night last from 5-20 minutes. Symptoms improve with a warm washcloth. Her weight is stable.   Abd/pelvic CT 03/30/2017 IMPRESSION: 1. No specific explanation for symptoms. 2. Mild colonic diverticulosis. 3. Iliac atherosclerosis.   Current Medications, Allergies, Past Medical History, Past Surgical History, Family History and Social History were reviewed in [employer] record.  Physical Exam: General: Well developed, well nourished, no acute distress Head: Normocephalic and atraumatic Eyes:  sclerae anicteric, EOMI Ears: Normal auditory acuity Mouth: No deformity or lesions Lungs: Clear throughout to auscultation Heart: Regular rate and rhythm; no murmurs, rubs or bruits Abdomen: Soft, non tender and non distended. No masses, hepatosplenomegaly or hernias noted. Normal Bowel sounds Musculoskeletal: Symmetrical with no gross deformities  Pulses:  Normal pulses noted Extremities: No clubbing, cyanosis, edema or deformities noted Neurological: Alert oriented x 4, grossly nonfocal Psychological:  Alert and cooperative. Normal mood and affect  Assessment and Recommendations:  1.  N/V, GERD, LUQ and LLQ pain. Continue omeprazole 40 mg twice daily. Zofran 4 mg 3 times a day before meals. Follow standard antireflux measures. Avoid any foods that exacerbate symptoms. Schedule EGD. The risks (including bleeding, perforation, infection, missed lesions, medication reactions and possible hospitalization or surgery if complications occur), benefits, and alternatives to endoscopy with possible biopsy and possible dilation were discussed with the patient and they consent to proceed.   2. IBS-D, LUQ and LLQ pain. Avoid foods that exacerbate symptoms. Begin glycopyrrolate 1 mg twice daily. Trial of a low FODMAP diet if symptoms not improving.   3. Proctalgia fugax. Reassured. Warm wash cloth prn.

## 2017-04-28 ENCOUNTER — Encounter: Payer: Self-pay | Admitting: Gastroenterology

## 2017-05-04 ENCOUNTER — Ambulatory Visit (AMBULATORY_SURGERY_CENTER): Payer: BLUE CROSS/BLUE SHIELD | Admitting: Gastroenterology

## 2017-05-04 ENCOUNTER — Encounter: Payer: Self-pay | Admitting: Gastroenterology

## 2017-05-04 VITALS — BP 164/76 | HR 63 | Temp 97.7°F | Resp 26 | Ht 61.5 in | Wt 173.0 lb

## 2017-05-04 DIAGNOSIS — K219 Gastro-esophageal reflux disease without esophagitis: Secondary | ICD-10-CM

## 2017-05-04 DIAGNOSIS — K2950 Unspecified chronic gastritis without bleeding: Secondary | ICD-10-CM

## 2017-05-04 DIAGNOSIS — B9681 Helicobacter pylori [H. pylori] as the cause of diseases classified elsewhere: Secondary | ICD-10-CM | POA: Diagnosis not present

## 2017-05-04 DIAGNOSIS — R112 Nausea with vomiting, unspecified: Secondary | ICD-10-CM

## 2017-05-04 DIAGNOSIS — R1032 Left lower quadrant pain: Secondary | ICD-10-CM | POA: Diagnosis not present

## 2017-05-04 DIAGNOSIS — R1012 Left upper quadrant pain: Secondary | ICD-10-CM

## 2017-05-04 MED ORDER — SODIUM CHLORIDE 0.9 % IV SOLN: 500.0000 mL | INTRAVENOUS | Status: AC

## 2017-05-04 NOTE — Progress Notes (Signed)
Called to room to assist during endoscopic procedure.  Patient ID and intended procedure confirmed with present staff. Received instructions for my participation in the procedure from the performing physician.  

## 2017-05-04 NOTE — Progress Notes (Signed)
Report given to PACU, vss 

## 2017-05-04 NOTE — Op Note (Signed)
[place] Patient Name: Mary Collier Procedure Date: 05/04/2017 3:29 PM MRN: [id number] Endoscopist: Ladene Artist , MD Age: 66 Referring MD:  Date of Birth: 15-Apr-1951 Gender: Female Account #: [id number] Procedure:                Upper GI endoscopy Indications:              Abdominal pain in the left upper quadrant,                            Abdominal pain in the left lower quadrant,                            Suspected gastro-esophageal reflux disease, Nausea                            with vomiting Medicines:                Monitored Anesthesia Care Procedure:                Pre-Anesthesia Assessment:                           - Prior to the procedure, a History and Physical                            was performed, and patient medications and                            allergies were reviewed. The patient's tolerance of                            previous anesthesia was also reviewed. The risks                            and benefits of the procedure and the sedation                            options and risks were discussed with the patient.                            All questions were answered, and informed consent                            was obtained. Prior Anticoagulants: The patient has                            taken no previous anticoagulant or antiplatelet                            agents. ASA Grade Assessment: II - A patient with                            mild systemic disease. After reviewing the risks  and benefits, the patient was deemed in                            satisfactory condition to undergo the procedure.                           After obtaining informed consent, the endoscope was                            passed under direct vision. Throughout the                            procedure, the patient's blood pressure, pulse, and                            oxygen saturations were monitored continuously. The                         Endoscope was introduced through the mouth, and                            advanced to the second part of duodenum. The upper                            GI endoscopy was accomplished without difficulty.                            The patient tolerated the procedure well. Scope In: Scope Out: Findings:                 The examined esophagus was normal.                           Diffuse mild inflammation characterized by erythema                            and granularity was found in the gastric fundus and                            in the gastric body. The gastric folds were less                            prominent than normal. Biopsies were taken with a                            cold forceps for histology.                           The exam of the stomach was otherwise normal.                           The duodenal bulb and second portion of the                            duodenum were normal.  Complications:            No immediate complications. Estimated Blood Loss:     Estimated blood loss was minimal. Impression:               - Normal esophagus.                           - Gastritis. Biopsied.                           - Normal duodenal bulb and second portion of the                            duodenum. Recommendation:           - Patient has a contact number available for                            emergencies. The signs and symptoms of potential                            delayed complications were discussed with the                            patient. Return to normal activities tomorrow.                            Written discharge instructions were provided to the                            patient.                           - Resume previous diet.                           - Continue present medications.                           - Await pathology results. Ladene Artist, MD 05/04/2017 4:11:53 PM This report has been signed electronically.

## 2017-05-04 NOTE — Patient Instructions (Signed)
Biopsies taken today. Handout given on Gastritis. Result letter in your mail in 2-3 weeks. Resume current medications. Call us with any questions or concerns. Thank you!!  YOU HAD AN ENDOSCOPIC PROCEDURE TODAY AT [facility]:   Refer to the procedure report that was given to you for any specific questions about what was found during the examination.  If the procedure report does not answer your questions, please call your gastroenterologist to clarify.  If you requested that your care partner not be given the details of your procedure findings, then the procedure report has been included in a sealed envelope for you to review at your convenience later.  YOU SHOULD EXPECT: Some feelings of bloating in the abdomen. Passage of more gas than usual.  Walking can help get rid of the air that was put into your GI tract during the procedure and reduce the bloating. If you had a lower endoscopy (such as a colonoscopy or flexible sigmoidoscopy) you may notice spotting of blood in your stool or on the toilet paper. If you underwent a bowel prep for your procedure, you may not have a normal bowel movement for a few days.  Please Note:  You might notice some irritation and congestion in your nose or some drainage.  This is from the oxygen used during your procedure.  There is no need for concern and it should clear up in a day or so.  SYMPTOMS TO REPORT IMMEDIATELY:   Following upper endoscopy (EGD)  Vomiting of blood or coffee ground material  New chest pain or pain under the shoulder blades  Painful or persistently difficult swallowing  New shortness of breath  Fever of 100F or higher  Black, tarry-looking stools  For urgent or emergent issues, a gastroenterologist can be reached at any hour by calling [phone].   DIET:  We do recommend a small meal at first, but then you may proceed to your regular diet.  Drink plenty of fluids but you should avoid alcoholic beverages for 24  hours.  ACTIVITY:  You should plan to take it easy for the rest of today and you should NOT DRIVE or use heavy machinery until tomorrow (because of the sedation medicines used during the test).    FOLLOW UP: Our staff will call the number listed on your records the next business day following your procedure to check on you and address any questions or concerns that you may have regarding the information given to you following your procedure. If we do not reach you, we will leave a message.  However, if you are feeling well and you are not experiencing any problems, there is no need to return our call.  We will assume that you have returned to your regular daily activities without incident.  If any biopsies were taken you will be contacted by phone or by letter within the next 1-3 weeks.  Please call us at [phone] if you have not heard about the biopsies in 3 weeks.    SIGNATURES/CONFIDENTIALITY: You and/or your care partner have signed paperwork which will be entered into your electronic medical record.  These signatures attest to the fact that that the information above on your After Visit Summary has been reviewed and is understood.  Full responsibility of the confidentiality of this discharge information lies with you and/or your care-partner.

## 2017-05-05 ENCOUNTER — Telehealth: Payer: Self-pay | Admitting: *Deleted

## 2017-05-05 NOTE — Telephone Encounter (Signed)
  Follow up Call-  Call back number 05/04/2017  Post procedure Call Back phone  # [phone]  Permission to leave phone message Yes  Some recent data might be hidden     Patient questions:  Do you have a fever, pain , or abdominal swelling? No. Pain Score  0 *  Have you tolerated food without any problems? Yes.    Have you been able to return to your normal activities? Yes.    Do you have any questions about your discharge instructions: Diet   No. Medications  No. Follow up visit  No.  Do you have questions or concerns about your Care? No.  Actions: * If pain score is 4 or above: No action needed, pain <4.

## 2017-05-06 ENCOUNTER — Other Ambulatory Visit: Payer: Self-pay | Admitting: Internal Medicine

## 2017-05-12 ENCOUNTER — Other Ambulatory Visit: Payer: Self-pay

## 2017-05-12 MED ORDER — OMEPRAZOLE 40 MG PO CPDR: 40.0000 mg | DELAYED_RELEASE_CAPSULE | Freq: Two times a day (BID) | ORAL | 3 refills | Status: DC

## 2017-05-12 MED ORDER — AMOXICILLIN 500 MG PO CAPS: 1000.0000 mg | ORAL_CAPSULE | Freq: Two times a day (BID) | ORAL | 0 refills | Status: DC

## 2017-05-12 MED ORDER — METRONIDAZOLE 500 MG PO TABS: 500.0000 mg | ORAL_TABLET | Freq: Two times a day (BID) | ORAL | 0 refills | Status: DC

## 2017-05-12 MED ORDER — CLARITHROMYCIN 500 MG PO TABS: 500.0000 mg | ORAL_TABLET | Freq: Two times a day (BID) | ORAL | 0 refills | Status: DC

## 2017-07-31 IMAGING — MR MR HEAD WO/W CM
10 series · 48 of 48 positions shown · IV contrast (multihance)
Comparison: MRI of the head April 06, 2006

CLINICAL DATA: LEFT eye pain and vision loss, diplopia. Headache
for 2-3 years. History of glaucoma.

EXAM:
MRI HEAD WITHOUT AND WITH CONTRAST
TECHNIQUE: Multiplanar, multiecho pulse sequences of the brain and surrounding
structures were obtained without and with intravenous contrast.
CONTRAST:  15mL MULTIHANCE GADOBENATE DIMEGLUMINE 529 MG/ML IV SOLN

[Series 5: T1 · sagittal · 4.0mm · 0.75mm/px · 3 of 31 slices shown (1 of 3)]
[im 1/31]
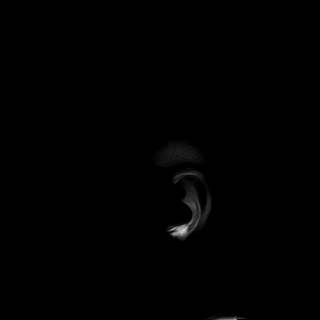
[im 16/31]
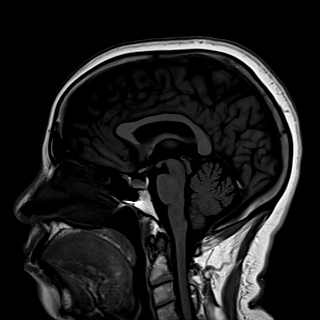
[im 31/31]
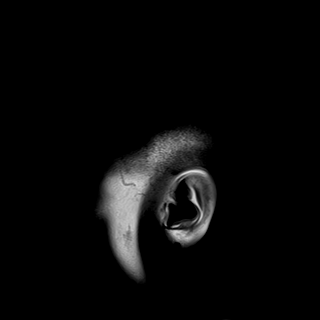

[Series 6: DWI · axial · 3.0mm · 1.44mm/px · z∈[-56,+79]mm · 7 of 84 slices shown (1 of 2)]
[im 1/84]
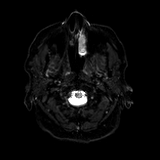
[im 14/84]
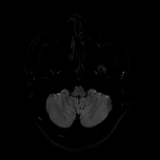
[im 28/84]
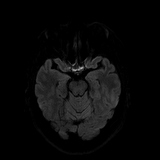
[im 42/84]
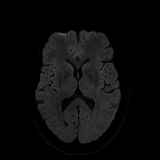
[im 56/84]
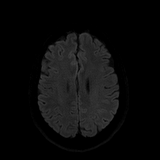
[im 70/84]
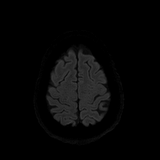
[im 84/84]
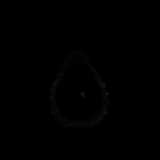

[Series 7: DWI · axial · 3.0mm · 1.44mm/px · z∈[-56,+79]mm · 3 of 42 slices shown (2 of 2)]
[im 1/42]
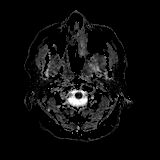
[im 21/42]
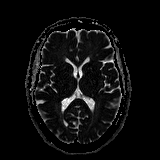
[im 42/42]
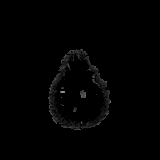

[Series 8: T2 · axial · 4.0mm · 0.36mm/px · z∈[-59,+76]mm · 2 of 27 slices shown]
[im 1/27]
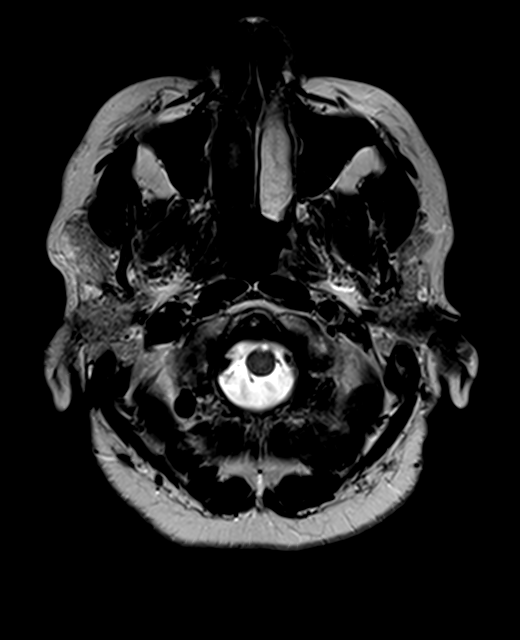
[im 27/27]
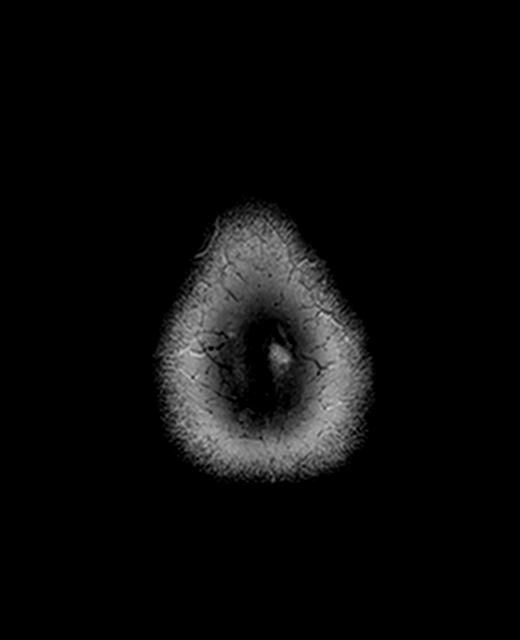

[Series 9: FLAIR · axial · 3.0mm · 0.72mm/px · z∈[-66,+84]mm · 2 of 26 slices shown]
[im 1/26]
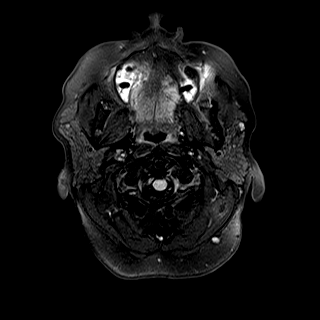
[im 26/26]
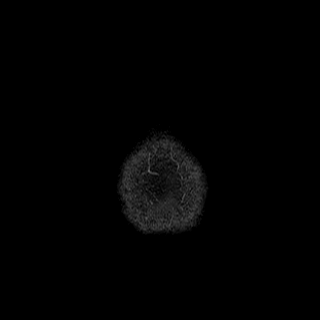

[Series 11: swi_images · axial · 1.5mm · 0.90mm/px · z∈[-63,+79]mm · 7 of 96 slices shown]
[im 1/96]
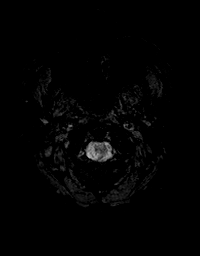
[im 16/96]
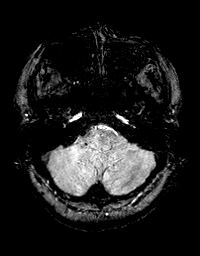
[im 32/96]
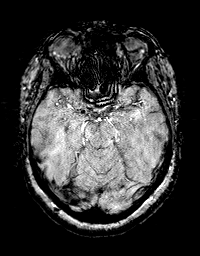
[im 48/96]
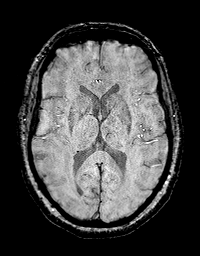
[im 64/96]
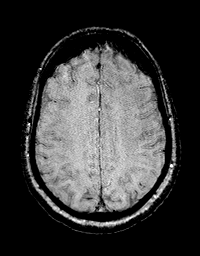
[im 80/96]
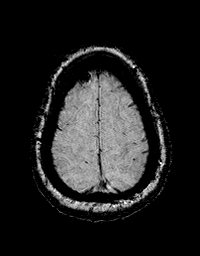
[im 96/96]
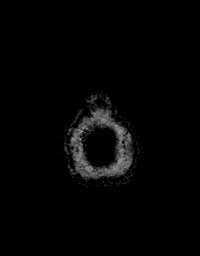

[Series 12: T1 · axial · 1.0mm · 0.90mm/px · z∈[-63,+80]mm · 10 of 144 slices shown (2 of 3)]
[im 1/144]
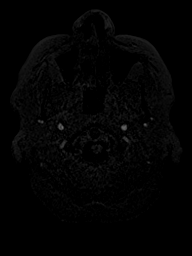
[im 16/144]
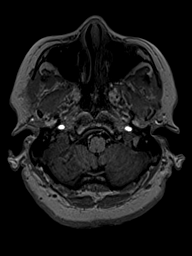
[im 32/144]
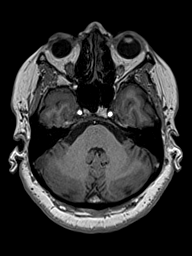
[im 48/144]
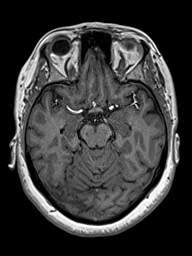
[im 64/144]
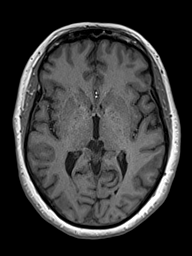
[im 80/144]
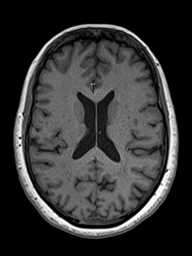
[im 96/144]
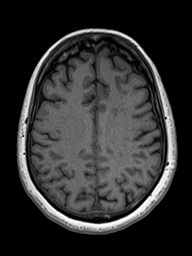
[im 112/144]
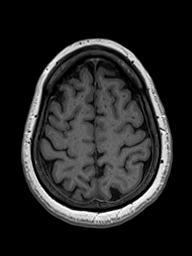
[im 128/144]
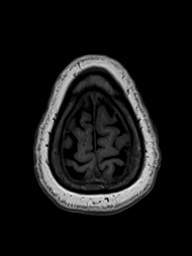
[im 144/144]
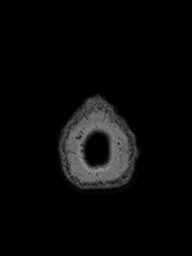

[Series 13: T2 post-contrast · coronal · 4.0mm · 0.36mm/px · 2 of 34 slices shown]
[im 1/34]
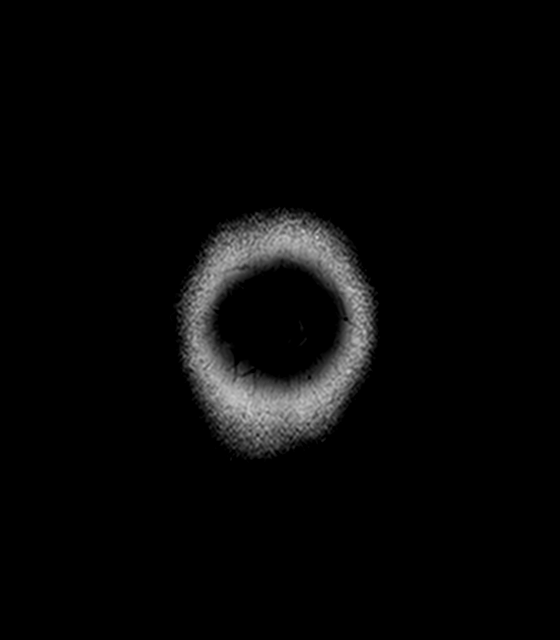
[im 34/34]
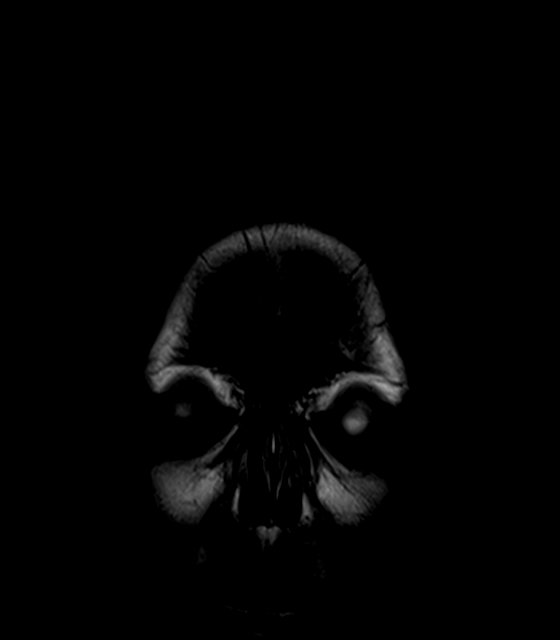

[Series 14: T1 · axial · 1.0mm · 0.90mm/px · z∈[-63,+80]mm · 10 of 144 slices shown (3 of 3)]
[im 1/144]
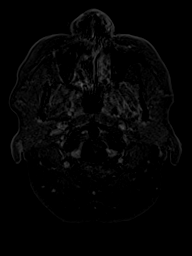
[im 16/144]
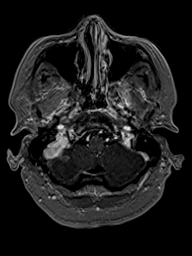
[im 32/144]
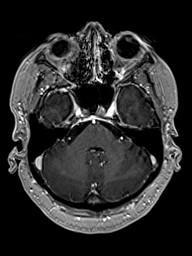
[im 48/144]
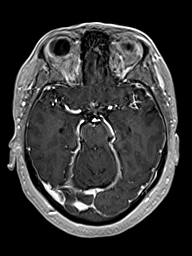
[im 64/144]
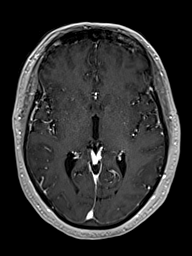
[im 80/144]
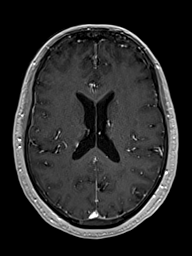
[im 96/144]
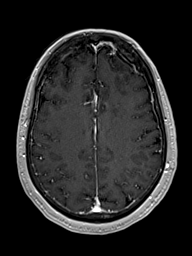
[im 112/144]
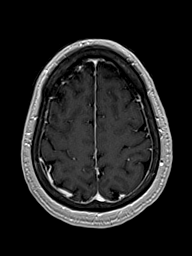
[im 128/144]
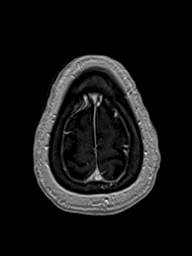
[im 144/144]
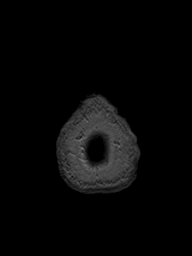

[Series 15: T1 post-contrast · coronal · 4.0mm · 0.72mm/px · 2 of 34 slices shown]
[im 1/34]
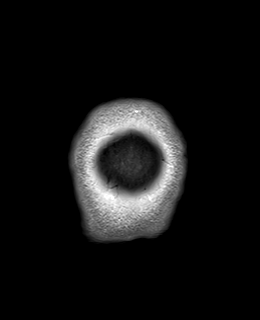
[im 34/34]
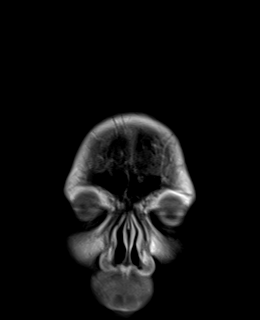

[48 of 48 positions shown; findings below may reference images not displayed]

FINDINGS: INTRACRANIAL CONTENTS: No reduced diffusion to suggest acute
ischemia. No susceptibility artifact to suggest hemorrhage. The
ventricles and sulci are normal for patient's age. Patchy
supratentorial white matter FLAIR T2 hyperintensities increased from
2224. No suspicious parenchymal signal, masses, mass effect. No
abnormal intraparenchymal or extra-axial enhancement. No abnormal
extra-axial fluid collections. No extra-axial masses.

VASCULAR: Normal major intracranial vascular flow voids present at
skull base.

SKULL AND UPPER CERVICAL SPINE: No abnormal sellar expansion. No
suspicious calvarial bone marrow signal. Craniocervical junction
maintained.

SINUSES/ORBITS: The mastoid air-cells and included paranasal sinuses
are well-aerated.The included ocular globes and orbital contents are
non-suspicious.

OTHER: None.
IMPRESSION: No acute intracranial process.

Mild chronic small vessel ischemic disease.

## 2017-11-02 ENCOUNTER — Other Ambulatory Visit: Payer: Self-pay | Admitting: Internal Medicine

## 2018-04-03 ENCOUNTER — Encounter (HOSPITAL_COMMUNITY): Payer: Self-pay | Admitting: *Deleted

## 2018-04-03 ENCOUNTER — Other Ambulatory Visit: Payer: Self-pay

## 2018-04-03 ENCOUNTER — Ambulatory Visit (HOSPITAL_COMMUNITY)
Admission: EM | Admit: 2018-04-03 | Discharge: 2018-04-03 | Disposition: A | Discharge status: Home / Self Care | Payer: BLUE CROSS/BLUE SHIELD | Attending: Family Medicine | Admitting: Family Medicine

## 2018-04-03 DIAGNOSIS — J0100 Acute maxillary sinusitis, unspecified: Secondary | ICD-10-CM

## 2018-04-03 MED ORDER — AMOXICILLIN-POT CLAVULANATE 875-125 MG PO TABS: 1.0000 | ORAL_TABLET | Freq: Two times a day (BID) | ORAL | 0 refills | Status: AC

## 2018-04-03 MED ORDER — TRIAMCINOLONE ACETONIDE 55 MCG/ACT NA AERO: 2.0000 | INHALATION_SPRAY | Freq: Every day | NASAL | 12 refills | Status: DC

## 2018-04-03 NOTE — Discharge Instructions (Signed)
Push fluids to ensure adequate hydration and keep secretions thin.  Tylenol and/or ibuprofen as needed for pain or fevers.  Complete course of antibiotics.  Daily nasal spray. If symptoms worsen or do not improve in the next week to return to be seen or to follow up with your PCP.

## 2018-04-03 NOTE — ED Provider Notes (Signed)
[facility]    CSN: [id number] Arrival date & time: 04/03/18  1657     History   Chief Complaint Chief Complaint  Patient presents with  . Otalgia    with dizziness  . Cough  . Sore Throat    HPI Mary Collier is a 67 y.o. female.   Calli presents with complaints of right ear pain with nasal drainage and cough. Chills off and on. Ear makes her feel dizzy at times. No dizziness now. Has been taking dramamine which has helped. States has had similar years ago and needed antibiotics. No nausea, vomiting or diarrhea. No chest pain  Or palpitations. No weakness. Pain 4/10. No ill contacts. Symptoms started 6/7. Hx cancer, headache, gerd, allergies, fibromyalgia, htn.    ROS per HPI.      Past Medical History:  Diagnosis Date  . Allergic rhinitis   . Allergy   . Arthritis   . Asthma   . Blood transfusion without reported diagnosis    1966  . Cancer ([place])    at age 44.  abdominal  . Cancer of kidney ([place])    at age 49.  Right kidney.  . Chronic headache   . Edema   . GERD (gastroesophageal reflux disease)   . Glaucoma   . Hiatal hernia   . Hypertension   . IBS (irritable bowel syndrome)   . Internal hemorrhoid   . Lumbago   . Lump or mass in breast   . Lung cancer ([place])    at age 46  Right lung.  . Other abnormal blood chemistry   . Other and unspecified hyperlipidemia   . Other convulsions    hx seizures at age 8  . Peptic stricture of esophagus   . Reflux esophagitis   . Tubular adenoma of colon 2015  . Unspecified menopausal and postmenopausal disorder   . Unspecified vitamin D deficiency     Patient Active Problem List   Diagnosis Date Noted  . GERD (gastroesophageal reflux disease) 07/12/2014  . Prediabetes 07/12/2014  . Rash and nonspecific skin eruption 07/12/2014  . Fibromyalgia 04/27/2013  . Special screening for malignant neoplasms, colon 03/30/2013  . Unspecified vitamin D deficiency 03/30/2013  . HTN (hypertension) 03/30/2013    . Other and unspecified hyperlipidemia 03/30/2013  . Other malaise and fatigue 03/30/2013  . Unspecified constipation 03/30/2013  . Routine general medical examination at a health care facility 03/30/2013  . Hyperglycemia 03/30/2013  . Pulmonary nodule 04/16/2011    Past Surgical History:  Procedure Laterality Date  . ABDOMINAL SURGERY  at age 79   . BREAST SURGERY  1970s and 1980s   Bilateral cysts removed  . KIDNEY SURGERY  at age 53   Right  . LUNG SURGERY  at age 88   Right  . NASAL SINUS SURGERY  1980s  . TONSILLECTOMY  1970  . TOTAL ABDOMINAL HYSTERECTOMY  2000s    OB History   None      Home Medications    Prior to Admission medications   Medication Sig Start Date End Date Taking? Authorizing Provider  atorvastatin (LIPITOR) 10 MG tablet Take 1 tablet (10 mg total) by mouth daily. 04/27/17  Yes Carter, Monica, DO  bimatoprost (LUMIGAN) 0.03 % ophthalmic solution Place 1 drop into both eyes at bedtime.     Yes [provider]  calcium-vitamin D (OSCAL 500/200 D-3) 500-200 MG-UNIT per tablet Take 1 tablet by mouth 2 (two) times daily. 07/12/14  Yes Pandey,  Mahima, MD  dimenhyDRINATE (DRAMAMINE) 50 MG tablet Take 50 mg by mouth every 8 (eight) hours as needed for dizziness.   Yes Emergency, Nurse, RN  folic acid (FOLVITE) 1 MG tablet Take 1 mg by mouth daily.   Yes [provider]  metoprolol succinate (TOPROL-XL) 25 MG 24 hr tablet TAKE 1 TABLET BY MOUTH EVERY DAY 11/02/17  Yes Gildardo Cranker, DO  acetaminophen (TYLENOL) 325 MG tablet Take 325 mg by mouth as needed.      [provider]  amoxicillin-clavulanate (AUGMENTIN) 875-125 MG tablet Take 1 tablet by mouth every 12 (twelve) hours for 10 days. 04/03/18 04/13/18  Augusto Gamble B, NP  [facility] E 200 units capsule Take 200 Units by mouth daily. 08/04/16   [provider]  [facility] SENNA PLUS 8.6-50 MG tablet TAKE 1 TABLET BY MOUTH DAILY. 01/04/16   Lauree Chandler, NP  hydrocortisone  (ANUSOL-HC) 25 MG suppository PLACE 1 SUPPOSITORY PER RECTUM TWICE A DAY 05/30/15   Lauree Chandler, NP  ondansetron (ZOFRAN) 4 MG tablet Take 1 tablet (4 mg total) by mouth every 8 (eight) hours as needed for nausea or vomiting. 04/08/17   Gildardo Cranker, DO  triamcinolone (NASACORT) 55 MCG/ACT AERO nasal inhaler Place 2 sprays into the nose daily. 04/03/18   Zigmund Gottron, NP    Family History Family History  Problem Relation Age of Onset  . Allergies Sister        x3  . Asthma Sister   . Bone cancer Father   . Lung cancer Father   . Stomach cancer Father   . Esophageal cancer Father   . Breast cancer Mother   . Cancer Mother   . Cancer Brother   . Hypertension Brother   . Prostate cancer Brother   . Diabetes Brother   . Heart disease Sister   . Diabetes Sister   . Hypertension Sister   . Breast cancer Sister        mastectomy  . Stroke Sister   . Heart disease Other        2 siblings  . Colon cancer Neg Hx   . Rectal cancer Neg Hx   . Liver cancer Neg Hx     Social History Social History   Tobacco Use  . Smoking status: Never Smoker  . Smokeless tobacco: Never Used  Substance Use Topics  . Alcohol use: No  . Drug use: No     Allergies   Influenza vaccines; Shrimp [shellfish allergy]; Other; Celecoxib; Codeine; Compazine [prochlorperazine]; Peanut-containing drug products; Prednisone; Prochlorperazine edisylate; Robinul [glycopyrrolate]; Vioxx [rofecoxib]; Aspirin; Eggs or egg-derived products; Epinephrine; Erythromycin; Sulfa antibiotics; and Tetracycline   Review of Systems Review of Systems   Physical Exam Triage Vital Signs ED Triage Vitals  Enc Vitals Group     BP 04/03/18 1729 (!) 150/78     Pulse Rate 04/03/18 1729 62     Resp 04/03/18 1729 18     Temp 04/03/18 1729 98.1 F (36.7 C)     Temp Source 04/03/18 1729 Temporal     SpO2 04/03/18 1729 100 %     Weight 04/03/18 1732 170 lb (77.1 kg)     Height 04/03/18 1732 5\' 1"  (1.549 m)      Head Circumference --      Peak Flow --      Pain Score 04/03/18 1730 3     Pain Loc --      Pain Edu? --  Excl. in GC? --    No data found.  Updated Vital Signs BP (!) 150/78 (BP Location: Left Arm)   Pulse 62   Temp 98.1 F (36.7 C) (Temporal)   Resp 18   Ht 5\' 1"  (1.549 m)   Wt 170 lb (77.1 kg)   SpO2 100%   BMI 32.12 kg/m       Physical Exam  Constitutional: She is oriented to person, place, and time. She appears well-developed and well-nourished. No distress.  HENT:  Head: Normocephalic and atraumatic.  Right Ear: External ear and ear canal normal. A middle ear effusion is present.  Left Ear: External ear and ear canal normal. A middle ear effusion is present.  Nose: Right sinus exhibits maxillary sinus tenderness. Left sinus exhibits maxillary sinus tenderness.  Mouth/Throat: Uvula is midline, oropharynx is clear and moist and mucous membranes are normal. No tonsillar exudate.  Eyes: Pupils are equal, round, and reactive to light. Conjunctivae and EOM are normal.  Cardiovascular: Normal rate, regular rhythm and normal heart sounds.  Pulmonary/Chest: Effort normal and breath sounds normal.  Lymphadenopathy:    She has no cervical adenopathy.  Neurological: She is alert and oriented to person, place, and time. She has normal strength. No cranial nerve deficit or sensory deficit.  Skin: Skin is warm and dry.     UC Treatments / Results  Labs (all labs ordered are listed, but only abnormal results are displayed) Labs Reviewed - No data to display  EKG None  Radiology No results found.  Procedures Procedures (including critical care time)  Medications Ordered in UC Medications - No data to display  Initial Impression / Assessment and Plan / UC Course  I have reviewed the triage vital signs and the nursing notes.  Pertinent labs & imaging results that were available during my care of the patient were reviewed by me and considered in my medical decision  making (see chart for details).     Non toxic in appearance. Symptoms have been improving but not resolving. Consistent with sinusitis at this time. augmentin course provided. Daily nasacort. Push fluids. Return precautions provided. Patient verbalized understanding and agreeable to plan.  Ambulatory out of clinic without difficulty.    Final Clinical Impressions(s) / UC Diagnoses   Final diagnoses:  Acute maxillary sinusitis, recurrence not specified     Discharge Instructions     Push fluids to ensure adequate hydration and keep secretions thin.  Tylenol and/or ibuprofen as needed for pain or fevers.  Complete course of antibiotics.  Daily nasal spray. If symptoms worsen or do not improve in the next week to return to be seen or to follow up with your PCP.     ED Prescriptions    Medication Sig Dispense Auth. Provider   amoxicillin-clavulanate (AUGMENTIN) 875-125 MG tablet Take 1 tablet by mouth every 12 (twelve) hours for 10 days. 20 tablet Augusto Gamble B, NP   triamcinolone (NASACORT) 55 MCG/ACT AERO nasal inhaler Place 2 sprays into the nose daily. 1 Inhaler Zigmund Gottron, NP     Controlled Substance Prescriptions [place] Controlled Substance Registry consulted? Not Applicable   Zigmund Gottron, NP 04/03/18 [phone]

## 2018-04-13 ENCOUNTER — Encounter: Payer: BLUE CROSS/BLUE SHIELD | Admitting: Internal Medicine

## 2018-04-28 ENCOUNTER — Other Ambulatory Visit: Payer: Self-pay | Admitting: Internal Medicine

## 2018-04-28 ENCOUNTER — Other Ambulatory Visit: Payer: Self-pay | Admitting: Gastroenterology

## 2018-06-09 ENCOUNTER — Encounter: Payer: Self-pay | Admitting: Internal Medicine

## 2018-06-15 ENCOUNTER — Encounter: Payer: Self-pay | Admitting: Internal Medicine

## 2018-06-15 ENCOUNTER — Ambulatory Visit (INDEPENDENT_AMBULATORY_CARE_PROVIDER_SITE_OTHER): Payer: Managed Care, Other (non HMO) | Admitting: Internal Medicine

## 2018-06-15 VITALS — BP 128/80 | HR 61 | Temp 97.3°F | Resp 10 | Ht 62.0 in | Wt 179.0 lb

## 2018-06-15 DIAGNOSIS — I10 Essential (primary) hypertension: Secondary | ICD-10-CM

## 2018-06-15 DIAGNOSIS — K219 Gastro-esophageal reflux disease without esophagitis: Secondary | ICD-10-CM

## 2018-06-15 DIAGNOSIS — Z0000 Encounter for general adult medical examination without abnormal findings: Secondary | ICD-10-CM

## 2018-06-15 DIAGNOSIS — Z1331 Encounter for screening for depression: Secondary | ICD-10-CM | POA: Diagnosis not present

## 2018-06-15 DIAGNOSIS — R112 Nausea with vomiting, unspecified: Secondary | ICD-10-CM

## 2018-06-15 DIAGNOSIS — Z79899 Other long term (current) drug therapy: Secondary | ICD-10-CM

## 2018-06-15 DIAGNOSIS — K644 Residual hemorrhoidal skin tags: Secondary | ICD-10-CM | POA: Diagnosis not present

## 2018-06-15 DIAGNOSIS — E2839 Other primary ovarian failure: Secondary | ICD-10-CM

## 2018-06-15 MED ORDER — HYDROCORTISONE ACE-PRAMOXINE 1-1 % RE FOAM: 1.0000 | Freq: Two times a day (BID) | RECTAL | 2 refills | Status: DC

## 2018-06-15 MED ORDER — ONDANSETRON HCL 4 MG PO TABS: 4.0000 mg | ORAL_TABLET | Freq: Three times a day (TID) | ORAL | 3 refills | Status: DC | PRN

## 2018-06-15 NOTE — Progress Notes (Signed)
Patient ID: Mary Collier, female   DOB: 1951/02/25, 67 y.o.   MRN: [id number]   Location:  [facility]  Place of Service:  OFFICE  Provider: Arletha Grippe, DO  Patient Care Team: Gildardo Cranker, DO as PCP - General (Internal Medicine)  Extended Emergency Contact Information Primary Emergency Contact: Dunivan,Jerome Address: [address]          [place],  [address] Johnnette Litter of [place] Phone: [phone] Mobile Phone: [phone] Relation: Spouse  Code Status:  Goals of Care: Advanced Directive information Advanced Directives 05/04/2017  Does Patient Have a Medical Advance Directive? No  Would patient like information on creating a medical advance directive? -  Pre-existing out of facility DNR order (yellow form or pink MOST form) -     Chief Complaint  Patient presents with  . Annual Exam    Yearly check up, no pap (GYN), EKG, MMSE 30/30, passed clock drawing, - fall, and - depression   . Best Practice Recommendations    Bone Density order pending, refused Hep C,      HPI: Patient is a 67 y.o. female seen in today for an annual wellness exam. She reports hemorrhoids are painful and awakens her from sleep now. anusol supp provides temporary relief. She has not d/w GI Dr Fuller Plan about worsening pain.    She is c/a shingles as she has intermittent painful itching raised rash on right shoulder blade. Nothing tried OTC.  Prediabetes - attempts to make healthy food choices. Last A1c 6.2%  HTN - BP stable on metoprolol  GERD - sx's stable on omeprazole. Constipation stable om senna and miralax  Hyperlipidemia - stable on lipitor. LDL 96  Glaucoma - stable on lumigan. Followed by eye specialist  Left breast pain - last mammogram/US in 2016. B/l breast MRI in 06/2016 revealed no evidence of malignancy. FHx breast CA. She does monthly self breast exam. She is followed by her GYN. She plans to get DXA through GYN office    Depression screen [facility] 2/9  06/15/2018 04/09/2016 07/12/2014 04/11/2014 03/29/2013  Decreased Interest 0 0 0 0 0  Down, Depressed, Hopeless 0 0 0 0 0  PHQ - 2 Score 0 0 0 0 0    Fall Risk  06/15/2018 04/08/2017 03/25/2017 01/01/2017 04/09/2016  Falls in the past year? _0   Number falls in past yr: - - - - -  Injury with Fall? - - - - -  Risk for fall due to : - - - - -  Risk for fall due to: Comment - - - - -   MMSE - Mini Mental State Exam 06/15/2018 04/09/2016  Not completed: - (No Data)  Orientation to time 5 5  Orientation to Place 5 5  Registration 3 3  Attention/ Calculation - 5  Recall 3 3  Language- name 2 objects 2 2  Language- repeat 1 1  Language- follow 3 step command 3 3  Language- read & follow direction 1 1  Write a sentence 1 1  Copy design 1 1  Total score - 30     Health Maintenance  Topic Date Due  . DEXA SCAN  11/06/2016  . INFLUENZA VACCINE  05/20/2018  . TETANUS/TDAP  06/16/2019 (Originally [birth year]/1970)  . Hepatitis C Screening  06/16/2019 (Originally [birth year])  . MAMMOGRAM  06/19/2018  . COLONOSCOPY  05/24/2019  . PNA vac Low Risk Adult  Completed    Urinary incontinence?  Functional Status Survey:  Exercise?  Diet?  No exam data present  Hearing:    Dentition:  Pain:  Past Medical History:  Diagnosis Date  . Allergic rhinitis   . Allergy   . Arthritis   . Asthma   . Blood transfusion without reported diagnosis    1966  . Cancer ([place])    at age 44.  abdominal  . Cancer of kidney ([place])    at age 35.  Right kidney.  . Chronic headache   . Edema   . GERD (gastroesophageal reflux disease)   . Glaucoma   . Hiatal hernia   . Hypertension   . IBS (irritable bowel syndrome)   . Internal hemorrhoid   . Lumbago   . Lump or mass in breast   . Lung cancer ([place])    at age 60  Right lung.  . Other abnormal blood chemistry   . Other and unspecified hyperlipidemia   . Other convulsions    hx seizures at age 35  . Peptic stricture of esophagus   .  Reflux esophagitis   . Tubular adenoma of colon 2015  . Unspecified menopausal and postmenopausal disorder   . Unspecified vitamin D deficiency     Past Surgical History:  Procedure Laterality Date  . ABDOMINAL SURGERY  at age 66   . BREAST SURGERY  1970s and 1980s   Bilateral cysts removed  . KIDNEY SURGERY  at age 8   Right  . LUNG SURGERY  at age 47   Right  . NASAL SINUS SURGERY  1980s  . TONSILLECTOMY  1970  . TOTAL ABDOMINAL HYSTERECTOMY  2000s    Family History  Problem Relation Age of Onset  . Allergies Sister        x3  . Asthma Sister   . Bone cancer Father   . Lung cancer Father   . Stomach cancer Father   . Esophageal cancer Father   . Breast cancer Mother   . Cancer Mother   . Cancer Brother   . Hypertension Brother   . Prostate cancer Brother   . Diabetes Brother   . Heart disease Sister   . Diabetes Sister   . Hypertension Sister   . Breast cancer Sister        mastectomy  . Stroke Sister   . Heart disease Other        2 siblings  . Colon cancer Neg Hx   . Rectal cancer Neg Hx   . Liver cancer Neg Hx    Family Status  Relation Name Status  . Sister Little Ishikawa  . Sister Stana Bunting  . Father  Deceased  . Mother  Deceased  . Brother Marcello Moores Deceased  . Brother [employer]  . Brother [employer]  . Sister Samule Ohm  . Sister Roslyn Smiling  . Brother [employer]  . Sister [employer]  . Sister [employer]  . Sister [employer]  . Sister [employer]  . Other  (Not Specified)  . Brother [employer]  . Neg Hx  (Not Specified)    shoulder  Social History   Socioeconomic History  . Marital status: Married    Spouse name: Not on file  . Number of children: 0  . Years of education: Not on file  . Highest education level: Not on file  Occupational History  . Occupation: Investment banker, corporate (preschool)   Social Needs  . Financial resource strain: Not on file  . Food insecurity:  Worry: Not on file    Inability:  Not on file  . Transportation needs:    Medical: Not on file    Non-medical: Not on file  Tobacco Use  . Smoking status: Never Smoker  . Smokeless tobacco: Never Used  Substance and Sexual Activity  . Alcohol use: No  . Drug use: No  . Sexual activity: Not on file  Lifestyle  . Physical activity:    Days per week: Not on file    Minutes per session: Not on file  . Stress: Not on file  Relationships  . Social connections:    Talks on phone: Not on file    Gets together: Not on file    Attends religious service: Not on file    Active member of club or organization: Not on file    Attends meetings of clubs or organizations: Not on file    Relationship status: Not on file  . Intimate partner violence:    Fear of current or ex partner: Not on file    Emotionally abused: Not on file    Physically abused: Not on file    Forced sexual activity: Not on file  Other Topics Concern  . Not on file  Social History Narrative   Pt has 18 siblings.   No caffeine drinks     Allergies  Allergen Reactions  . Influenza Vaccines     D/t being allergic to eggs  . Shrimp [Shellfish Allergy] Swelling    Lip, throat, tongue swelling  . Other Itching    Newspaper Dye  . Celecoxib     Able to take IBU  . Codeine Swelling  . Compazine [Prochlorperazine]   . Peanut-Containing Drug Products     Itching  . Prednisone     Rapid heart rate  . Prochlorperazine Edisylate     paralyzed  . Robinul [Glycopyrrolate]     boils  . Vioxx [Rofecoxib]     States able to take IBU  . Aspirin Rash    States able to take IBU  . Eggs Or Egg-Derived Products Rash  . Epinephrine Rash  . Erythromycin Rash  . Sulfa Antibiotics Rash  . Tetracycline Rash    Allergies as of 06/15/2018      Reactions   Influenza Vaccines    D/t being allergic to eggs   Shrimp [shellfish Allergy] Swelling   Lip, throat, tongue swelling   Other Itching   Newspaper Dye   Celecoxib    Able to take IBU   Codeine  Swelling   Compazine [prochlorperazine]    Peanut-containing Drug Products    Itching   Prednisone    Rapid heart rate   Prochlorperazine Edisylate    paralyzed   Robinul [glycopyrrolate]    boils   Vioxx [rofecoxib]    States able to take IBU   Aspirin Rash   States able to take IBU   Eggs Or Egg-derived Products Rash   Epinephrine Rash   Erythromycin Rash   Sulfa Antibiotics Rash   Tetracycline Rash      Medication List        Accurate as of 06/15/18  3:04 PM. Always use your most recent med list.          atorvastatin 10 MG tablet Commonly known as:  LIPITOR Take 1 tablet (10 mg total) by mouth daily.   bimatoprost 0.03 % ophthalmic solution Commonly known as:  LUMIGAN Place 1 drop into both eyes at bedtime.  calcium-vitamin D 500-200 MG-UNIT tablet Commonly known as:  OSCAL WITH D Take 1 tablet by mouth 2 (two) times daily.   CVS E 200 UNIT capsule Generic drug:  vitamin E Take 200 Units by mouth daily.   CVS SENNA PLUS 8.6-50 MG tablet Generic drug:  senna-docusate TAKE 1 TABLET BY MOUTH DAILY.   dimenhyDRINATE 50 MG tablet Commonly known as:  DRAMAMINE Take 50 mg by mouth every 8 (eight) hours as needed for dizziness.   folic acid 1 MG tablet Commonly known as:  FOLVITE Take 1 mg by mouth daily.   hydrocortisone 25 MG suppository Commonly known as:  ANUSOL-HC PLACE 1 SUPPOSITORY PER RECTUM TWICE A DAY   metoprolol succinate 25 MG 24 hr tablet Commonly known as:  TOPROL-XL TAKE 1 TABLET BY MOUTH EVERY DAY   NASACORT ALLERGY 24HR 55 MCG/ACT Aero nasal inhaler Generic drug:  triamcinolone Place 2 sprays into the nose as needed.   ondansetron 4 MG tablet Commonly known as:  ZOFRAN Take 1 tablet (4 mg total) by mouth every 8 (eight) hours as needed for nausea or vomiting.   TYLENOL 325 MG tablet Generic drug:  acetaminophen Take 325 mg by mouth as needed.        Review of Systems:  Review of Systems  Physical Exam: Vitals:    06/15/18 1454  BP: 128/80  Pulse: 61  Resp: 10  Temp: (!) 97.3 F (36.3 C)  TempSrc: Oral  SpO2: 97%  Weight: 179 lb (81.2 kg)  Height: _0  (1.575 m)   Body mass index is 32.74 kg/m. Physical Exam  Constitutional: She is oriented to person, place, and time. She appears well-developed and well-nourished. No distress.  HENT:  Head: Normocephalic and atraumatic.  Right Ear: Hearing, tympanic membrane, external ear and ear canal normal.  Left Ear: Hearing, tympanic membrane, external ear and ear canal normal.  Mouth/Throat: Uvula is midline, oropharynx is clear and moist and mucous membranes are normal. She does not have dentures.  Eyes: Pupils are equal, round, and reactive to light. Conjunctivae, EOM and lids are normal. No scleral icterus.  Neck: Trachea normal and normal range of motion. Neck supple. Carotid bruit is not present. No thyroid mass and no thyromegaly present.  Cardiovascular: Normal rate, regular rhythm, normal heart sounds and intact distal pulses. Exam reveals no gallop and no friction rub.  No murmur heard. No LE edema b/l. No calf TTP.   Pulmonary/Chest: Effort normal and breath sounds normal. She has no wheezes. She has no rhonchi. She has no rales. She exhibits no mass. Right breast exhibits no inverted nipple, no mass, no nipple discharge, no skin change and no tenderness. Left breast exhibits tenderness (left outer with palpable easily compressible cysts). Left breast exhibits no inverted nipple, no mass, no nipple discharge and no skin change. Breasts are symmetrical.  Abdominal: Soft. Normal appearance, normal aorta and bowel sounds are normal. She exhibits distension. She exhibits no pulsatile midline mass and no mass. There is no hepatosplenomegaly. There is no tenderness. There is no rigidity, no rebound and no guarding. No hernia.  obese  Musculoskeletal: Normal range of motion. She exhibits edema. She exhibits no deformity.  Lymphadenopathy:       Head  (right side): No posterior auricular adenopathy present.       Head (left side): No posterior auricular adenopathy present.    She has no cervical adenopathy.       Right: No supraclavicular adenopathy present.  Left: No supraclavicular adenopathy present.  Neurological: She is alert and oriented to person, place, and time. She has normal strength and normal reflexes. No cranial nerve deficit. Gait normal.  Skin: Skin is warm, dry and intact. No rash noted. Nails show no clubbing.  Psychiatric: She has a normal mood and affect. Her speech is normal and behavior is normal. Thought content normal. Cognition and memory are normal.    Labs reviewed:  Basic Metabolic Panel: No results for input(s): NA, K, CL, CO2, GLUCOSE, BUN, CREATININE, CALCIUM, MG, PHOS, TSH in the last 8760 hours. Liver Function Tests: No results for input(s): AST, ALT, ALKPHOS, BILITOT, PROT, ALBUMIN in the last 8760 hours. No results for input(s): LIPASE, AMYLASE in the last 8760 hours. No results for input(s): AMMONIA in the last 8760 hours. CBC: No results for input(s): WBC, NEUTROABS, HGB, HCT, MCV, PLT in the last 8760 hours. Lipid Panel: No results for input(s): CHOL, HDL, LDLCALC, TRIG, CHOLHDL, LDLDIRECT in the last 8760 hours. Lab Results  Component Value Date   HGBA1C 5.9 (H) 04/08/2017    Procedures: No results found. ECG OBTAINED AND REVIEWED BY MYSELF: NSR @ 60 bpm, LAD, LAE, poor R wave progression. No change since 03/2016  Assessment/Plan   ICD-10-CM   1. Well adult exam Z00.00 Lipid Panel    TSH  2. Nausea and vomiting, intractability of vomiting not specified, unspecified vomiting type R11.2 ondansetron (ZOFRAN) 4 MG tablet  3. Essential hypertension I10 EKG 12-Lead    CBC with Differential/Platelets    Urinalysis with Reflex Microscopic  4. External hemorrhoids without complication K86.3 hydrocortisone-pramoxine (PROCTOFOAM HC) rectal foam  5. Estrogen deficiency E28.39 DG Bone Density    6. Gastroesophageal reflux disease, esophagitis presence not specified K21.9   7. High risk medication use Z79.899 CMP with eGFR([facility])    RECOMMEND YOU SPEAK WITH GI DR Fuller Plan REGARDING PAINFUL HEMORRHOIDS - as you may need to see a surgeon  STOP ANUSOL HC CREAM   START PROCTOFOAM HC TO HEMORRHOIDS 2 TIMES DAILY - if no better in 1 week, recommend GI +/- surgery eval for next steps  Continue other medications as ordered  Recommend shingles vaccine  She has an egg allergy and therefore flu vaccine contraindicated  Follow up with specialists as scheduled  Recommend Wilfred Lacy, NP at [facility] to continue your care.  Keeping You Healthy handout given   Cordella Register. Perlie Gold  [facility] [address] [place], [place] [address] [phone] Cell (Monday-Friday 8 AM - 5 PM) [phone] After 5 PM and follow prompts

## 2018-06-15 NOTE — Patient Instructions (Addendum)
RECOMMEND YOU SPEAK WITH GI DR Fuller Plan REGARDING PAINFUL HEMORRHOIDS - as you may need to see a surgeon  STOP ANUSOL HC CREAM   START PROCTOFOAM HC TO HEMORRHOIDS 2 TIMES DAILY - if no better in 1 week, recommend GI +/- surgery eval for next steps  Continue other medications as ordered  Recommend shingles vaccine  Follow up with specialists as scheduled  Recommend Wilfred Lacy, NP at [facility] to continue your care.  Keeping You Healthy  Get These Tests  Blood Pressure- Have your blood pressure checked by your healthcare provider at least once a year.  Normal blood pressure is 120/80.  Weight- Have your body mass index (BMI) calculated to screen for obesity.  BMI is a measure of body fat based on height and weight.  You can calculate your own BMI at [employer]velB[employer].it  Cholesterol- Have your cholesterol checked every year.  Diabetes- Have your blood sugar checked every year if you have high blood pressure, high cholesterol, a family history of diabetes or if you are overweight.  Pap Test - Have a pap test every 1 to 5 years if you have been sexually active.  If you are older than 65 and recent pap tests have been normal you may not need additional pap tests.  In addition, if you have had a hysterectomy  for benign disease additional pap tests are not necessary.  Mammogram-Yearly mammograms are essential for early detection of breast cancer  Screening for Colon Cancer- Colonoscopy starting at age 66. Screening may begin sooner depending on your family history and other health conditions.  Follow up colonoscopy as directed by your Gastroenterologist.  Screening for Osteoporosis- Screening begins at age 51 with bone density scanning, sooner if you are at higher risk for developing Osteoporosis.  Get these medicines  Calcium with Vitamin D- Your body requires 1200-1500 mg of Calcium a day and [phone] IU of Vitamin D a day.  You can only absorb 500 mg of Calcium at a  time therefore Calcium must be taken in 2 or 3 separate doses throughout the day.  Hormones- Hormone therapy has been associated with increased risk for certain cancers and heart disease.  Talk to your healthcare provider about if you need relief from menopausal symptoms.  Aspirin- Ask your healthcare provider about taking Aspirin to prevent Heart Disease and Stroke.  Get these Immuniztions  Flu shot- Every fall  Pneumonia shot- Once after the age of 52; if you are younger ask your healthcare provider if you need a pneumonia shot.  Tetanus- Every ten years.  Zostavax- Once after the age of 29 to prevent shingles.  Take these steps  Don't smoke- Your healthcare provider can help you quit. For tips on how to quit, ask your healthcare provider or go to www.smokefree.gov or call 1-800 QUIT-NOW.  Be physically active- Exercise 5 days a week for a minimum of 30 minutes.  If you are not already physically active, start slow and gradually work up to 30 minutes of moderate physical activity.  Try walking, dancing, bike riding, swimming, etc.  Eat a healthy diet- Eat a variety of healthy foods such as fruits, vegetables, whole grains, low fat milk, low fat cheeses, yogurt, lean meats, chicken, fish, eggs, dried beans, tofu, etc.  For more information go to www.thenutritionsource.org  Dental visit- Brush and floss teeth twice daily; visit your dentist twice a year.  Eye exam- Visit your Optometrist or Ophthalmologist yearly.  Drink alcohol in moderation- Limit alcohol intake to one  drink or less a day.  Never drink and drive.  Depression- Your emotional health is as important as your physical health.  If you're feeling down or losing interest in things you normally enjoy, please talk to your healthcare provider.  Seat Belts- can save your life; always wear one  Smoke/Carbon Monoxide detectors- These detectors need to be installed on the appropriate level of your home.  Replace batteries at  least once a year.  Violence- If anyone is threatening or hurting you, please tell your healthcare provider.  Living Will/ Health care power of attorney- Discuss with your healthcare provider and family.

## 2018-06-16 LAB — COMPLETE METABOLIC PANEL WITH GFR
AG Ratio: 1.1 (calc) (ref 1.0–2.5)
ALBUMIN MSPROF: 4.1 g/dL (ref 3.6–5.1)
ALKALINE PHOSPHATASE (APISO): 76 U/L (ref 33–130)
ALT: 17 U/L (ref 6–29)
AST: 16 U/L (ref 10–35)
BILIRUBIN TOTAL: 0.5 mg/dL (ref 0.2–1.2)
BUN: 17 mg/dL (ref 7–25)
CO2: 26 mmol/L (ref 20–32)
Calcium: 9.2 mg/dL (ref 8.6–10.4)
Chloride: 105 mmol/L (ref 98–110)
Creat: 0.94 mg/dL (ref 0.50–0.99)
GFR, EST AFRICAN AMERICAN: 73 mL/min/{1.73_m2} (ref >=60)
GFR, Est Non African American: 63 mL/min/{1.73_m2} (ref >=60)
GLOBULIN: 3.6 g/dL (ref 1.9–3.7)
Glucose, Bld: 77 mg/dL (ref 65–139)
Potassium: 4 mmol/L (ref 3.5–5.3)
SODIUM: 139 mmol/L (ref 135–146)
TOTAL PROTEIN: 7.7 g/dL (ref 6.1–8.1)

## 2018-06-16 LAB — URINALYSIS, ROUTINE W REFLEX MICROSCOPIC
BILIRUBIN URINE: NEGATIVE
Glucose, UA: NEGATIVE
HGB URINE DIPSTICK: NEGATIVE
Ketones, ur: NEGATIVE
Leukocytes, UA: NEGATIVE
NITRITE: NEGATIVE
Protein, ur: NEGATIVE
Specific Gravity, Urine: 1.028 (ref 1.001–1.03)
pH: <=5 (ref 5.0–8.0)

## 2018-06-16 LAB — CBC WITH DIFFERENTIAL/PLATELET
BASOS ABS: 23 {cells}/uL (ref 0–200)
BASOS PCT: 0.3 %
EOS ABS: 68 {cells}/uL (ref 15–500)
Eosinophils Relative: 0.9 %
HEMATOCRIT: 37 % (ref 35.0–45.0)
HEMOGLOBIN: 12.2 g/dL (ref 11.7–15.5)
LYMPHS ABS: 1995 {cells}/uL (ref 850–3900)
MCH: 28.8 pg (ref 27.0–33.0)
MCHC: 33 g/dL (ref 32.0–36.0)
MCV: 87.3 fL (ref 80.0–100.0)
MPV: 11.1 fL (ref 7.5–12.5)
Monocytes Relative: 10.4 %
NEUTROS ABS: 4635 {cells}/uL (ref 1500–7800)
Neutrophils Relative %: 61.8 %
Platelets: 228 10*3/uL (ref 140–400)
RBC: 4.24 10*6/uL (ref 3.80–5.10)
RDW: 13 % (ref 11.0–15.0)
Total Lymphocyte: 26.6 %
WBC mixed population: 780 cells/uL (ref 200–950)
WBC: 7.5 10*3/uL (ref 3.8–10.8)

## 2018-06-16 LAB — LIPID PANEL
CHOL/HDL RATIO: 3.8 (calc) (ref <5.0)
CHOLESTEROL: 153 mg/dL (ref <200)
HDL: 40 mg/dL — ABNORMAL LOW (ref >50)
LDL Cholesterol (Calc): 89 mg/dL (calc)
Non-HDL Cholesterol (Calc): 113 mg/dL (calc) (ref <130)
Triglycerides: 140 mg/dL (ref <150)

## 2018-06-16 LAB — TSH: TSH: 2.08 mIU/L (ref 0.40–4.50)

## 2018-06-30 ENCOUNTER — Other Ambulatory Visit: Payer: Self-pay | Admitting: Internal Medicine

## 2018-07-28 ENCOUNTER — Other Ambulatory Visit: Payer: Self-pay | Admitting: Gastroenterology

## 2018-12-07 ENCOUNTER — Encounter: Payer: Self-pay | Admitting: Nurse Practitioner

## 2018-12-07 ENCOUNTER — Ambulatory Visit (INDEPENDENT_AMBULATORY_CARE_PROVIDER_SITE_OTHER): Payer: Managed Care, Other (non HMO) | Admitting: Nurse Practitioner

## 2018-12-07 VITALS — BP 122/84 | HR 71 | Temp 98.0°F | Ht 62.0 in | Wt 177.0 lb

## 2018-12-07 DIAGNOSIS — I10 Essential (primary) hypertension: Secondary | ICD-10-CM

## 2018-12-07 DIAGNOSIS — R739 Hyperglycemia, unspecified: Secondary | ICD-10-CM | POA: Diagnosis not present

## 2018-12-07 DIAGNOSIS — R1032 Left lower quadrant pain: Secondary | ICD-10-CM

## 2018-12-07 DIAGNOSIS — R197 Diarrhea, unspecified: Secondary | ICD-10-CM

## 2018-12-07 DIAGNOSIS — E782 Mixed hyperlipidemia: Secondary | ICD-10-CM | POA: Diagnosis not present

## 2018-12-07 LAB — LIPID PANEL
Cholesterol: 139 mg/dL (ref 0–200)
HDL: 43 mg/dL (ref >39.00)
LDL Cholesterol: 76 mg/dL (ref 0–99)
NonHDL: 96.42
Total CHOL/HDL Ratio: 3
Triglycerides: 104 mg/dL (ref 0.0–149.0)
VLDL: 20.8 mg/dL (ref 0.0–40.0)

## 2018-12-07 LAB — HEPATIC FUNCTION PANEL
ALBUMIN: 4 g/dL (ref 3.5–5.2)
ALT: 14 U/L (ref 0–35)
AST: 15 U/L (ref 0–37)
Alkaline Phosphatase: 84 U/L (ref 39–117)
Bilirubin, Direct: 0.1 mg/dL (ref 0.0–0.3)
Total Bilirubin: 0.4 mg/dL (ref 0.2–1.2)
Total Protein: 7.6 g/dL (ref 6.0–8.3)

## 2018-12-07 LAB — BASIC METABOLIC PANEL
BUN: 18 mg/dL (ref 6–23)
CHLORIDE: 104 meq/L (ref 96–112)
CO2: 27 mEq/L (ref 19–32)
Calcium: 9.1 mg/dL (ref 8.4–10.5)
Creatinine, Ser: 0.84 mg/dL (ref 0.40–1.20)
GFR: 81.81 mL/min (ref >60.00)
GLUCOSE: 76 mg/dL (ref 70–99)
POTASSIUM: 4.1 meq/L (ref 3.5–5.1)
SODIUM: 138 meq/L (ref 135–145)

## 2018-12-07 LAB — HEMOGLOBIN A1C: Hgb A1c MFr Bld: 6.3 % (ref 4.6–6.5)

## 2018-12-07 MED ORDER — ATORVASTATIN CALCIUM 10 MG PO TABS: 10.0000 mg | ORAL_TABLET | Freq: Every day | ORAL | 3 refills | Status: DC

## 2018-12-07 MED ORDER — METOPROLOL SUCCINATE ER 25 MG PO TB24: 25.0000 mg | ORAL_TABLET | Freq: Every day | ORAL | 3 refills | Status: DC

## 2018-12-07 MED ORDER — SACCHAROMYCES BOULARDII 250 MG PO CAPS: 250.0000 mg | ORAL_CAPSULE | Freq: Two times a day (BID) | ORAL | Status: DC

## 2018-12-07 NOTE — Patient Instructions (Addendum)
Please make appt with GI: Dr. Fuller Plan  Maintain adequate oral hydration.  Start probiotic (florastor or florajen or [place]) 1cap BID.  You will be contacted to schedule appt for CT ABD/pelvis  Stable lab results. Proceed with CT ABD/pelvis. Also make appt with GI as discussed  Food Choices to Help Relieve Diarrhea, Adult When you have diarrhea, the foods you eat and your eating habits are very important. Choosing the right foods and drinks can help:  Relieve diarrhea.  Replace lost fluids and nutrients.  Prevent dehydration. What general guidelines should I follow?  Relieving diarrhea  Choose foods with less than 2 g or .07 oz. of fiber per serving.  Limit fats to less than 8 tsp (38 g or 1.34 oz.) a day.  Avoid the following: ? Foods and beverages sweetened with high-fructose corn syrup, honey, or sugar alcohols such as xylitol, sorbitol, and mannitol. ? Foods that contain a lot of fat or sugar. ? Fried, greasy, or spicy foods. ? High-fiber grains, breads, and cereals. ? Raw fruits and vegetables.  Eat foods that are rich in probiotics. These foods include dairy products such as yogurt and fermented milk products. They help increase healthy bacteria in the stomach and intestines (gastrointestinal tract, or GI tract).  If you have lactose intolerance, avoid dairy products. These may make your diarrhea worse.  Take medicine to help stop diarrhea (antidiarrheal medicine) only as told by your health care provider. Replacing nutrients  Eat small meals or snacks every 3-4 hours.  Eat bland foods, such as white rice, toast, or baked potato, until your diarrhea starts to get better. Gradually reintroduce nutrient-rich foods as tolerated or as told by your health care provider. This includes: ? Well-cooked protein foods. ? Peeled, seeded, and soft-cooked fruits and vegetables. ? Low-fat dairy products.  Take vitamin and mineral supplements as told by your health care  provider. Preventing dehydration  Start by sipping water or a special solution to prevent dehydration (oral rehydration solution, ORS). Urine that is clear or pale yellow means that you are getting enough fluid.  Try to drink at least 8-10 cups of fluid each day to help replace lost fluids.  You may add other liquids in addition to water, such as clear juice or decaffeinated sports drinks, as tolerated or as told by your health care provider.  Avoid drinks with caffeine, such as coffee, tea, or soft drinks.  Avoid alcohol. What foods are recommended?     The items listed may not be a complete list. Talk with your health care provider about what dietary choices are best for you. Grains White rice. White, [place], or pita breads (fresh or toasted), including plain rolls, buns, or bagels. White pasta. Saltine, soda, or graham crackers. Pretzels. Low-fiber cereal. Cooked cereals made with water (such as cornmeal, farina, or cream cereals). Plain muffins. Matzo. Melba toast. Zwieback. Vegetables Potatoes (without the skin). Most well-cooked and canned vegetables without skins or seeds. Tender lettuce. Fruits Apple sauce. Fruits canned in juice. Cooked apricots, cherries, grapefruit, peaches, pears, or plums. Fresh bananas and cantaloupe. Meats and other protein foods Baked or boiled chicken. Eggs. Tofu. Fish. Seafood. Smooth nut butters. Ground or well-cooked tender beef, ham, veal, lamb, pork, or poultry. Dairy Plain yogurt, kefir, and unsweetened liquid yogurt. Lactose-free milk, buttermilk, skim milk, or soy milk. Low-fat or nonfat hard cheese. Beverages Water. Low-calorie sports drinks. Fruit juices without pulp. Strained tomato and vegetable juices. Decaffeinated teas. Sugar-free beverages not sweetened with sugar alcohols. Oral rehydration solutions,  if approved by your health care provider. Seasoning and other foods Bouillon, broth, or soups made from recommended foods. What foods are  not recommended? The items listed may not be a complete list. Talk with your health care provider about what dietary choices are best for you. Grains Whole grain, whole wheat, bran, or rye breads, rolls, pastas, and crackers. Wild or brown rice. Whole grain or bran cereals. Barley. Oats and oatmeal. Corn tortillas or taco shells. Granola. Popcorn. Vegetables Raw vegetables. Fried vegetables. Cabbage, broccoli, Brussels sprouts, artichokes, baked beans, beet greens, corn, kale, legumes, peas, sweet potatoes, and yams. Potato skins. Cooked spinach and cabbage. Fruits Dried fruit, including raisins and dates. Raw fruits. Stewed or dried prunes. Canned fruits with syrup. Meat and other protein foods Fried or fatty meats. Deli meats. Chunky nut butters. Nuts and seeds. Beans and lentils. Berniece Salines. Hot dogs. Sausage. Dairy High-fat cheeses. Whole milk, chocolate milk, and beverages made with milk, such as milk shakes. Half-and-half. Cream. sour cream. Ice cream. Beverages Caffeinated beverages (such as coffee, tea, soda, or energy drinks). Alcoholic beverages. Fruit juices with pulp. Prune juice. Soft drinks sweetened with high-fructose corn syrup or sugar alcohols. High-calorie sports drinks. Fats and oils Butter. Cream sauces. Margarine. Salad oils. Plain salad dressings. Olives. Avocados. Mayonnaise. Sweets and desserts Sweet rolls, doughnuts, and sweet breads. Sugar-free desserts sweetened with sugar alcohols such as xylitol and sorbitol. Seasoning and other foods Honey. Hot sauce. Chili powder. Gravy. Cream-based or milk-based soups. Pancakes and waffles. Summary  When you have diarrhea, the foods you eat and your eating habits are very important.  Make sure you get at least 8-10 cups of fluid each day, or enough to keep your urine clear or pale yellow.  Eat bland foods and gradually reintroduce healthy, nutrient-rich foods as tolerated, or as told by your health care provider.  Avoid  high-fiber, fried, greasy, or spicy foods. This information is not intended to replace advice given to you by your health care provider. Make sure you discuss any questions you have with your health care provider. Document Released: 12/27/2003 Document Revised: 10/03/2016 Document Reviewed: 10/03/2016 Elsevier Interactive Patient Education  2019 [employer].

## 2018-12-07 NOTE — Progress Notes (Signed)
Subjective:  Patient ID: Mary Collier, female    DOB: [birth year]  Age: 68 y.o. MRN: [id number]  CC: Establish Care (est care/pt is complaining of left lower back pain,sore spot,painful--going on 2-3 yrs/ metoprolol and lipitor refill?) and Diarrhea (pt is complaining of dirrhea,watery,will have BM after eat--going on for a while now. sharp pain on rectum--FYI has GI doctor )  Diarrhea   This is a chronic problem. The current episode started more than 1 year ago. The problem occurs 2 to 4 times per day. The problem has been gradually worsening. The stool consistency is described as watery. The patient states that diarrhea does not awaken her from sleep. Associated symptoms include abdominal pain and bloating. Pertinent negatives include no chills, coughing, fever, headaches, increased  flatus, myalgias, sweats, URI, vomiting or weight loss. Exacerbated by: food intake. There are no known risk factors. She has tried anti-motility drug, increased fluids and change of diet for the symptoms. The treatment provided no relief. There is no history of irritable bowel syndrome.  also reports left side back and LUQ ABD pain, onset 1year ago, intermittent, worse in last 25months. Improves with rest and tylenol. Last colonoscopy 2015: polyps removed, diverticulosis. Endoscopy done 2018: gastritis. CT ABD/pelvis 2018: diverticulosis.  Reviewed past Medical, Social and Family history today.  Outpatient Medications Prior to Visit  Medication Sig Dispense Refill  . acetaminophen (TYLENOL) 325 MG tablet Take 325 mg by mouth as needed.      . bimatoprost (LUMIGAN) 0.03 % ophthalmic solution Place 1 drop into both eyes at bedtime.      . calcium-vitamin D (OSCAL 500/200 D-3) 500-200 MG-UNIT per tablet Take 1 tablet by mouth 2 (two) times daily. 60 tablet 12  . CVS E 200 units capsule Take 200 Units by mouth daily.  0  . CVS SENNA PLUS 8.6-50 MG tablet TAKE 1 TABLET BY MOUTH DAILY. 90 tablet 1  . dimenhyDRINATE  (DRAMAMINE) 50 MG tablet Take 50 mg by mouth every 8 (eight) hours as needed for dizziness.    . folic acid (FOLVITE) 1 MG tablet Take 1 mg by mouth daily.    . hydrocortisone (ANUSOL-HC) 25 MG suppository PLACE 1 SUPPOSITORY PER RECTUM TWICE A DAY 28 suppository 2  . hydrocortisone-pramoxine (PROCTOFOAM HC) rectal foam Place 1 applicator rectally 2 (two) times daily. 10 g 2  . ondansetron (ZOFRAN) 4 MG tablet Take 1 tablet (4 mg total) by mouth every 8 (eight) hours as needed for nausea or vomiting. 30 tablet 3  . triamcinolone (NASACORT ALLERGY 24HR) 55 MCG/ACT AERO nasal inhaler Place 2 sprays into the nose as needed.    Marland Kitchen atorvastatin (LIPITOR) 10 MG tablet TAKE 1 TABLET BY MOUTH EVERY DAY 90 tablet 1  . metoprolol succinate (TOPROL-XL) 25 MG 24 hr tablet TAKE 1 TABLET BY MOUTH EVERY DAY 90 tablet 1   No facility-administered medications prior to visit.     ROS See HPI  Objective:  BP 122/84   Pulse 71   Temp 98 F (36.7 C) (Oral)   Ht 5\' 2"  (1.575 m)   Wt 177 lb (80.3 kg)   SpO2 99%   BMI 32.37 kg/m   BP Readings from Last 3 Encounters:  12/07/18 122/84  06/15/18 128/80  04/03/18 (!) 150/78    Wt Readings from Last 3 Encounters:  12/07/18 177 lb (80.3 kg)  06/15/18 179 lb (81.2 kg)  04/03/18 170 lb (77.1 kg)    Physical Exam Cardiovascular:     Rate  and Rhythm: Normal rate and regular rhythm.     Pulses: Normal pulses.     Heart sounds: Normal heart sounds.  Pulmonary:     Effort: Pulmonary effort is normal.     Breath sounds: Normal breath sounds.  Abdominal:     General: Bowel sounds are normal. There is no distension.     Palpations: Abdomen is soft.     Tenderness: There is abdominal tenderness. There is no right CVA tenderness or left CVA tenderness.  Neurological:     Mental Status: She is alert and oriented to person, place, and time.     Lab Results  Component Value Date   WBC 7.5 06/15/2018   HGB 12.2 06/15/2018   HCT 37.0 06/15/2018   PLT 228  06/15/2018   GLUCOSE 76 12/07/2018   CHOL 139 12/07/2018   TRIG 104.0 12/07/2018   HDL 43.00 12/07/2018   LDLCALC 76 12/07/2018   ALT 14 12/07/2018   AST 15 12/07/2018   NA 138 12/07/2018   K 4.1 12/07/2018   CL 104 12/07/2018   CREATININE 0.84 12/07/2018   BUN 18 12/07/2018   CO2 27 12/07/2018   TSH 2.08 06/15/2018   HGBA1C 6.3 12/07/2018     Assessment & Plan:   Mary Collier was seen today for establish care and diarrhea.  Diagnoses and all orders for this visit:  Essential hypertension -     metoprolol succinate (TOPROL-XL) 25 MG 24 hr tablet; Take 1 tablet (25 mg total) by mouth daily. -     Basic metabolic panel  Mixed hyperlipidemia -     atorvastatin (LIPITOR) 10 MG tablet; Take 1 tablet (10 mg total) by mouth daily. -     Lipid panel -     Hepatic function panel  Diarrhea, unspecified type -     Basic metabolic panel -     saccharomyces boulardii (FLORASTOR) 250 MG capsule; Take 1 capsule (250 mg total) by mouth 2 (two) times daily. -     CT Abdomen Pelvis W Contrast; Future  Colicky LLQ abdominal pain -     Basic metabolic panel -     saccharomyces boulardii (FLORASTOR) 250 MG capsule; Take 1 capsule (250 mg total) by mouth 2 (two) times daily. -     CT Abdomen Pelvis W Contrast; Future  Hyperglycemia -     Hemoglobin A1c   I have changed Mary Collier's atorvastatin and metoprolol succinate. I am also having her start on saccharomyces boulardii. Additionally, I am having her maintain her bimatoprost, acetaminophen, folic acid, calcium-vitamin D, hydrocortisone, CVS SENNA PLUS, CVS E, dimenhyDRINATE, triamcinolone, ondansetron, and hydrocortisone-pramoxine.  Meds ordered this encounter  Medications  . atorvastatin (LIPITOR) 10 MG tablet    Sig: Take 1 tablet (10 mg total) by mouth daily.    Dispense:  90 tablet    Refill:  3    Order Specific Question:   Supervising Provider    Answer:   Lucille Passy [[id number]]  . metoprolol succinate (TOPROL-XL) 25 MG 24 hr  tablet    Sig: Take 1 tablet (25 mg total) by mouth daily.    Dispense:  90 tablet    Refill:  3    Order Specific Question:   Supervising Provider    Answer:   Lucille Passy [[id number]]  . saccharomyces boulardii (FLORASTOR) 250 MG capsule    Sig: Take 1 capsule (250 mg total) by mouth 2 (two) times daily.    Order Specific Question:  Supervising Provider    Answer:   Lucille Passy [[id number]]    Problem List Items Addressed This Visit      Cardiovascular and Mediastinum   HTN (hypertension) - Primary   Relevant Medications   atorvastatin (LIPITOR) 10 MG tablet   metoprolol succinate (TOPROL-XL) 25 MG 24 hr tablet   Other Relevant Orders   Basic metabolic panel (Completed)     Other   Hyperglycemia   Relevant Orders   Hemoglobin A1c (Completed)   Hyperlipidemia   Relevant Medications   atorvastatin (LIPITOR) 10 MG tablet   metoprolol succinate (TOPROL-XL) 25 MG 24 hr tablet   Other Relevant Orders   Lipid panel (Completed)   Hepatic function panel (Completed)    Other Visit Diagnoses    Diarrhea, unspecified type       Relevant Medications   saccharomyces boulardii (FLORASTOR) 250 MG capsule   Other Relevant Orders   Basic metabolic panel (Completed)   CT Abdomen Pelvis W Contrast   Colicky LLQ abdominal pain       Relevant Medications   saccharomyces boulardii (FLORASTOR) 250 MG capsule   Other Relevant Orders   Basic metabolic panel (Completed)   CT Abdomen Pelvis W Contrast       Follow-up: Return in about 6 months (around 06/07/2019) for CPE (fasting).  Wilfred Lacy, NP

## 2018-12-08 ENCOUNTER — Encounter: Payer: Self-pay | Admitting: Nurse Practitioner

## 2019-01-01 ENCOUNTER — Encounter (HOSPITAL_COMMUNITY): Payer: Self-pay

## 2019-01-01 ENCOUNTER — Ambulatory Visit (HOSPITAL_COMMUNITY)
Admission: EM | Admit: 2019-01-01 | Discharge: 2019-01-01 | Disposition: A | Discharge status: Home / Self Care | Payer: Managed Care, Other (non HMO) | Attending: Family Medicine | Admitting: Family Medicine

## 2019-01-01 DIAGNOSIS — J069 Acute upper respiratory infection, unspecified: Secondary | ICD-10-CM

## 2019-01-01 DIAGNOSIS — H9201 Otalgia, right ear: Secondary | ICD-10-CM

## 2019-01-01 MED ORDER — LEVOCETIRIZINE DIHYDROCHLORIDE 5 MG PO TABS: 2.5000 mg | ORAL_TABLET | Freq: Every evening | ORAL | 1 refills | Status: DC

## 2019-01-01 MED ORDER — IPRATROPIUM BROMIDE 0.03 % NA SOLN: 2.0000 | Freq: Two times a day (BID) | NASAL | 12 refills | Status: DC

## 2019-01-01 MED ORDER — FLUTICASONE PROPIONATE 50 MCG/ACT NA SUSP: 2.0000 | Freq: Every day | NASAL | 2 refills | Status: DC

## 2019-01-01 NOTE — ED Provider Notes (Signed)
[facility]    CSN: [id number] Arrival date & time: 01/01/19  1044     History   Chief Complaint Chief Complaint  Patient presents with   Otalgia   Nasal Congestion    HPI Mary Collier is a 68 y.o. female.   HPI  Patient with history of seasonal allergies present with a complaint of right ear pain, nasal congestion, and recent nighttime cough. Symptoms present for 1 week. She works around Network engineer. No fever. Ear pain and popping. Ear feels clogged. Nasal drainage is clear. No headache and or throat pain. Cough is non-productive and mostly occurs at night. No associated shortness of breath or wheezing. Suffers from hypertension which is always controlled. BP elevated today. Asymptomatic of chest pain or headache. Endorses dizziness with head movements, although associates with ear pain and ear congestion.  She has attempted relief of upper respiratory symptoms with Delsym, Allegra, and 1 dose of Flonase. Past Medical History:  Diagnosis Date   Allergic rhinitis    Allergy    Arthritis    Asthma    Blood transfusion without reported diagnosis    1966   Cancer ([place])    at age 56.  abdominal   Cancer of kidney ([place])    at age 4.  Right kidney.   Chronic headache    Edema    GERD (gastroesophageal reflux disease)    Glaucoma    Hiatal hernia    Hypertension    IBS (irritable bowel syndrome)    Internal hemorrhoid    Lumbago    Lump or mass in breast    Lung cancer ([facility])    at age 3  Right lung.   Other abnormal blood chemistry    Other and unspecified hyperlipidemia    Other convulsions    hx seizures at age 72   Peptic stricture of esophagus    Reflux esophagitis    Tubular adenoma of colon 2015   Unspecified menopausal and postmenopausal disorder    Unspecified vitamin D deficiency     Patient Active Problem List   Diagnosis Date Noted   GERD (gastroesophageal reflux disease) 07/12/2014   Prediabetes  07/12/2014   Rash and nonspecific skin eruption 07/12/2014   Fibromyalgia 04/27/2013   Special screening for malignant neoplasms, colon 03/30/2013   Unspecified vitamin D deficiency 03/30/2013   HTN (hypertension) 03/30/2013   Hyperlipidemia 03/30/2013   Other malaise and fatigue 03/30/2013   Unspecified constipation 03/30/2013   Routine general medical examination at a health care facility 03/30/2013   Hyperglycemia 03/30/2013   Pulmonary nodule 04/16/2011    Past Surgical History:  Procedure Laterality Date   ABDOMINAL SURGERY  at age 89    BREAST SURGERY  1970s and 1980s   Bilateral cysts removed   KIDNEY SURGERY  at age 19   Right   LUNG SURGERY  at age 41   Right   [place]  2000s    OB History   No obstetric history on file.      Home Medications    Prior to Admission medications   Medication Sig Start Date End Date Taking? Authorizing Provider  acetaminophen (TYLENOL) 325 MG tablet Take 325 mg by mouth as needed.      [provider]  atorvastatin (LIPITOR) 10 MG tablet Take 1 tablet (10 mg total) by mouth daily. 12/07/18   Nche, Charlene Brooke, NP  bimatoprost (  LUMIGAN) 0.03 % ophthalmic solution Place 1 drop into both eyes at bedtime.      [provider]  calcium-vitamin D (OSCAL 500/200 D-3) 500-200 MG-UNIT per tablet Take 1 tablet by mouth 2 (two) times daily. 07/12/14   Blanchie Serve, MD  CVS E 200 units capsule Take 200 Units by mouth daily. 08/04/16   [provider]  CVS SENNA PLUS 8.6-50 MG tablet TAKE 1 TABLET BY MOUTH DAILY. 01/04/16   Lauree Chandler, NP  dimenhyDRINATE (DRAMAMINE) 50 MG tablet Take 50 mg by mouth every 8 (eight) hours as needed for dizziness.    Emergency, Nurse, RN  folic acid (FOLVITE) 1 MG tablet Take 1 mg by mouth daily.    [provider]  hydrocortisone (ANUSOL-HC) 25 MG suppository PLACE 1 SUPPOSITORY  PER RECTUM TWICE A DAY 05/30/15   Lauree Chandler, NP  hydrocortisone-pramoxine (PROCTOFOAM [facility]) rectal foam Place 1 applicator rectally 2 (two) times daily. 06/15/18   Gildardo Cranker, DO  metoprolol succinate (TOPROL-XL) 25 MG 24 hr tablet Take 1 tablet (25 mg total) by mouth daily. 12/07/18   Nche, Charlene Brooke, NP  ondansetron (ZOFRAN) 4 MG tablet Take 1 tablet (4 mg total) by mouth every 8 (eight) hours as needed for nausea or vomiting. 06/15/18   Gildardo Cranker, DO  saccharomyces boulardii (FLORASTOR) 250 MG capsule Take 1 capsule (250 mg total) by mouth 2 (two) times daily. 12/07/18   Nche, Charlene Brooke, NP  triamcinolone (NASACORT ALLERGY 24HR) 55 MCG/ACT AERO nasal inhaler Place 2 sprays into the nose as needed.    [provider]    Family History Family History  Problem Relation Age of Onset   Allergies Sister        x3   Asthma Sister    Bone cancer Father    Lung cancer Father    Stomach cancer Father    Esophageal cancer Father    Breast cancer Mother    Cancer Mother    Cancer Brother    Hypertension Brother    Prostate cancer Brother    Cancer Brother        bone cancer   Diabetes Brother    Heart disease Sister    Diabetes Sister    Hypertension Sister    Breast cancer Sister        mastectomy   Stroke Sister    Heart disease Other        2 siblings   Colon cancer Neg Hx    Rectal cancer Neg Hx    Liver cancer Neg Hx     Social History Social History   Tobacco Use   Smoking status: Never Smoker   Smokeless tobacco: Never Used  Substance Use Topics   Alcohol use: No   Drug use: No     Allergies   Influenza vaccines; Shrimp [shellfish allergy]; Other; Celecoxib; Codeine; Compazine [prochlorperazine]; Peanut-containing drug products; Prednisone; Prochlorperazine edisylate; Robinul [glycopyrrolate]; Vioxx [rofecoxib]; Aspirin; Eggs or egg-derived products; Epinephrine; Erythromycin; Sulfa antibiotics; and  Tetracycline   Review of Systems Review of Systems Pertinent negatives listed in HPI  Physical Exam Triage Vital Signs ED Triage Vitals  Enc Vitals Group     BP 01/01/19 1112 (!) 177/87     Pulse Rate 01/01/19 1112 65     Resp 01/01/19 1112 18     Temp 01/01/19 1112 98 F (36.7 C)     Temp Source 01/01/19 1112 Oral     SpO2 01/01/19 1112 99 %  Weight --      Height --      Head Circumference --      Peak Flow --      Pain Score 01/01/19 1113 8     Pain Loc --      Pain Edu? --      Excl. in [place]? --    No data found.  Updated Vital Signs BP (!) 177/87 (BP Location: Right Arm)    Pulse 65    Temp 98 F (36.7 C) (Oral)    Resp 18    SpO2 99%   Visual Acuity Right Eye Distance:   Left Eye Distance:   Bilateral Distance:    Right Eye Near:   Left Eye Near:    Bilateral Near:     Physical Exam Constitutional:      Appearance: Normal appearance.  HENT:     Right Ear: External ear normal. A middle ear effusion is present. There is no impacted cerumen. Tympanic membrane is not erythematous.     Nose: Congestion and rhinorrhea present.     Mouth/Throat:     Mouth: Mucous membranes are moist.  Eyes:     Extraocular Movements: Extraocular movements intact.     Pupils: Pupils are equal, round, and reactive to light.  Neck:     Musculoskeletal: Normal range of motion and neck supple.  Cardiovascular:     Rate and Rhythm: Normal rate and regular rhythm.     Pulses: Normal pulses.  Pulmonary:     Effort: Pulmonary effort is normal.     Breath sounds: Normal breath sounds.  Lymphadenopathy:     Cervical: No cervical adenopathy.  Skin:    General: Skin is warm and dry.  Neurological:     General: No focal deficit present.     Mental Status: She is alert.  Psychiatric:        Mood and Affect: Mood normal.        Thought Content: Thought content normal.      UC Treatments / Results  Labs (all labs ordered are listed, but only abnormal results are  displayed) Labs Reviewed - No data to display  EKG None  Radiology No results found.  Procedures Procedures (including critical care time)  Medications Ordered in UC Medications - No data to display  Initial Impression / Assessment and Plan / UC Course  I have reviewed the triage vital signs and the nursing notes.  Pertinent labs & imaging results that were available during my care of the patient were reviewed by me and considered in my medical decision making (see chart for details).   Patient with a history of seasonal allergies presents today with complaint of nasal congestion and right ear pain.  Right ear is significant for a effusion in exam is significant for nasal congestion.  Respiratory exam is normal.  Treat symptomatically with levo cetirizine 2.5 mg at bedtime, Atrovent nasal spray twice daily as needed for nasal congestion.  Resume consistent use of Flonase 2 puffs once daily.  Patient has extensive allergies therefore will not prescribe prednisone.  Patient advised to follow-up with PCP if symptoms persist or worsen. Final Clinical Impressions(s) / UC Diagnoses   Final diagnoses:  Viral upper respiratory tract infection  Right ear pain   Discharge Instructions   None    ED Prescriptions    Medication Sig Dispense Auth. Provider   ipratropium (ATROVENT) 0.03 % nasal spray Place 2 sprays into both nostrils every 12 (twelve)  hours. 30 mL Scot Jun, FNP   levocetirizine (XYZAL) 5 MG tablet Take 0.5 tablets (2.5 mg total) by mouth every evening. 30 tablet Scot Jun, FNP   fluticasone (FLONASE) 50 MCG/ACT nasal spray Place 2 sprays into both nostrils daily. 11.1 g Scot Jun, FNP     Controlled Substance Prescriptions [place] Controlled Substance Registry consulted? Not Applicable   Scot Jun, FNP 01/01/19 1137

## 2019-01-01 NOTE — ED Triage Notes (Signed)
Pt present right ear ringing and itching with some nasal congestion and scratchy throat.  Symptoms started on Monday. Pt has tried OTC mediation with no relief.

## 2019-01-03 ENCOUNTER — Encounter: Payer: Self-pay | Admitting: Nurse Practitioner

## 2019-01-13 ENCOUNTER — Other Ambulatory Visit: Payer: Self-pay

## 2019-01-13 ENCOUNTER — Other Ambulatory Visit: Payer: Self-pay | Admitting: Physician Assistant

## 2019-01-13 ENCOUNTER — Ambulatory Visit
Admission: RE | Admit: 2019-01-13 | Discharge: 2019-01-13 | Disposition: A | Discharge status: Home / Self Care | Payer: Managed Care, Other (non HMO) | Source: Ambulatory Visit | Attending: Physician Assistant | Admitting: Physician Assistant

## 2019-01-13 DIAGNOSIS — M79605 Pain in left leg: Secondary | ICD-10-CM

## 2019-01-19 ENCOUNTER — Ambulatory Visit: Payer: Self-pay | Admitting: *Deleted

## 2019-01-19 NOTE — Telephone Encounter (Signed)
Pt called with C/O her BP elevating.. she states that at her last visit her BP was up and it was note by Wilfred Lacy. She has recorded today 155/91.  I had her recheck and it was 143/77 HR 75. Pt is also concerned about swelling to her left ankle.  She has has it checked at urgent care and would like your evaluation.  Today she says their is no swelling and no pain. See urgent care notes of 01/13/2019.  E mail verified. Pt read care advice. Pt verbalized understanding of all instructions.  Reason for Disposition . [0] Systolic BP  >= 092 OR Diastolic >= 80 AND [3] taking BP medications  Answer Assessment - Initial Assessment Questions 1. BLOOD PRESSURE: "What is the blood pressure?" "Did you take at least two measurements 5 minutes apart?"     155/91 this am  Now 143/77 HR 75 2. ONSET: "When did you take your blood pressure?"     today 3. HOW: "How did you obtain the blood pressure?" (e.g., visiting nurse, automatic home BP monitor)     Home BP monitor 4. HISTORY: "Do you have a history of high blood pressure?"     yes 5. MEDICATIONS: "Are you taking any medications for blood pressure?" "Have you missed any doses recently?"     Metoprolol 25  6. OTHER SYMPTOMS: "Do you have any symptoms?" (e.g., headache, chest pain, blurred vision, difficulty breathing, weakness)     no 7. PREGNANCY: "Is there any chance you are pregnant?" "When was your last menstrual period?"     N/A  Answer Assessment - Initial Assessment Questions 1. LOCATION: "Which joint is swollen?"     Left ankle 2. ONSET: "When did the swelling start?"     Ongoing for a couple weeks 3. SIZE: "How large is the swelling?"    Not swollen today 4. PAIN: "Is there any pain?" If so, ask: "How bad is it?" (Scale 1-10; or mild, moderate, severe)     0 5. CAUSE: "What do you think caused the swollen joint?"     unsure 6. OTHER SYMPTOMS: "Do you have any other symptoms?" (e.g., fever, chest pain, difficulty breathing, calf pain)   no 7. PREGNANCY: "Is there any chance you are pregnant?" "When was your last menstrual period?"    N/A  Protocols used: HIGH BLOOD PRESSURE-A-AH, ANKLE SWELLING-A-AH

## 2019-01-20 ENCOUNTER — Other Ambulatory Visit: Payer: Self-pay

## 2019-01-20 ENCOUNTER — Ambulatory Visit (INDEPENDENT_AMBULATORY_CARE_PROVIDER_SITE_OTHER): Payer: Managed Care, Other (non HMO) | Admitting: Nurse Practitioner

## 2019-01-20 ENCOUNTER — Ambulatory Visit: Payer: Self-pay | Admitting: Nurse Practitioner

## 2019-01-20 ENCOUNTER — Encounter: Payer: Self-pay | Admitting: Nurse Practitioner

## 2019-01-20 VITALS — BP 155/85 | HR 67 | Temp 98.0°F | Ht 62.0 in | Wt 170.0 lb

## 2019-01-20 DIAGNOSIS — I10 Essential (primary) hypertension: Secondary | ICD-10-CM

## 2019-01-20 MED ORDER — LISINOPRIL 5 MG PO TABS: 5.0000 mg | ORAL_TABLET | Freq: Every day | ORAL | 2 refills | Status: DC

## 2019-01-20 NOTE — Telephone Encounter (Signed)
aapt made today with Nche.

## 2019-01-20 NOTE — Progress Notes (Signed)
Virtual Visit via Video Note  I connected with Mary Collier on 01/20/19 at 11:00 AM EDT by a video enabled telemedicine application and verified that I am speaking with the correct person using two identifiers.   I discussed the limitations of evaluation and management by telemedicine and the availability of in person appointments. The patient expressed understanding and agreed to proceed.  History of Present Illness: CC: high BP consult: pt report last night BP: 144/77/ pt also complain of leg/knee/sore,painful and left arm painful this morning. took tylenol otc/ Hypertension  This is a chronic problem. The problem has been waxing and waning since onset. The problem is uncontrolled. Associated symptoms include headaches. Pertinent negatives include no anxiety, blurred vision, chest pain, malaise/fatigue, neck pain, orthopnea, palpitations, peripheral edema, PND, shortness of breath or sweats. There are no associated agents to hypertension. Risk factors for coronary artery disease include dyslipidemia, obesity, post-menopausal state and sedentary lifestyle. Past treatments include beta blockers. The current treatment provides mild improvement. There are no compliance problems.  There is no history of kidney disease, CAD/MI, heart failure, left ventricular hypertrophy, PVD or retinopathy. There is no history of sleep apnea or a thyroid problem.   Reviewed medication and problem list.   Observations/Objective: Physical Exam  Constitutional: She is oriented to person, place, and time. No distress.  Eyes: EOM are normal.  Neck: Normal range of motion. Neck supple.  Cardiovascular: Normal rate.  Pulmonary/Chest: Effort normal.  Musculoskeletal: Normal range of motion.        General: No edema.  Neurological: She is alert and oriented to person, place, and time.  Psychiatric: She has a normal mood and affect. Her behavior is normal. Thought content normal.  Vitals reviewed.  Assessment and  Plan: [name] was seen today for hypertension.  Diagnoses and all orders for this visit:  Essential hypertension -     lisinopril (PRINIVIL,ZESTRIL) 5 MG tablet; Take 1 tablet (5 mg total) by mouth daily.   Follow Up Instructions: Continue current medications and start lisinopril once daily. Continue to monitor BP once a day in AM. F/p in 2weeks for BP re eval. Advised patient any signs and symptoms of CVA and MI. She is to call 911 if present.   I discussed the assessment and treatment plan with the patient. The patient was provided an opportunity to ask questions and all were answered. The patient agreed with the plan and demonstrated an understanding of the instructions.   The patient was advised to call back or seek an in-person evaluation if the symptoms worsen or if the condition fails to improve as anticipated.  Mary Lacy, NP

## 2019-01-20 NOTE — Assessment & Plan Note (Signed)
Uncontrolled with metoprolol. BP Readings from Last 3 Encounters:  01/20/19 (!) 155/85  01/01/19 (!) 177/87  12/07/18 122/84   Add lisinopril. F/up in 2weeks

## 2019-01-20 NOTE — Patient Instructions (Signed)
Continue current medications and start lisinopril once daily. Continue to monitor BP once a day in AM. F/p in 2weeks for BP re eval. Advised patient any signs and symptoms of CVA and MI. She is to call 911 if present.

## 2019-01-20 NOTE — Telephone Encounter (Signed)
I have a video chat appt with Mary Collier this morning at 11:00.   Do I click on Join the meeting when she calls?   I'm not tech savy at all so not sure how to do this.  I instructed her that when Baldo Ash calls her it will be via the computer with that link and she will click on the green "join the Meeting" and she will be connected with Baldo Ash.   She verbalized understanding and thanked me for my help.   I instructed her to call back if she had difficulty.

## 2019-02-03 ENCOUNTER — Other Ambulatory Visit: Payer: Managed Care, Other (non HMO)

## 2019-02-03 ENCOUNTER — Encounter: Payer: Self-pay | Admitting: Nurse Practitioner

## 2019-02-03 ENCOUNTER — Ambulatory Visit (INDEPENDENT_AMBULATORY_CARE_PROVIDER_SITE_OTHER): Payer: Managed Care, Other (non HMO) | Admitting: Nurse Practitioner

## 2019-02-03 VITALS — BP 143/77 | HR 65 | Temp 98.0°F | Ht 62.0 in | Wt 170.0 lb

## 2019-02-03 DIAGNOSIS — K6289 Other specified diseases of anus and rectum: Secondary | ICD-10-CM | POA: Diagnosis not present

## 2019-02-03 DIAGNOSIS — R1032 Left lower quadrant pain: Secondary | ICD-10-CM | POA: Diagnosis not present

## 2019-02-03 DIAGNOSIS — R110 Nausea: Secondary | ICD-10-CM | POA: Diagnosis not present

## 2019-02-03 DIAGNOSIS — I10 Essential (primary) hypertension: Secondary | ICD-10-CM | POA: Diagnosis not present

## 2019-02-03 MED ORDER — LISINOPRIL 5 MG PO TABS: 5.0000 mg | ORAL_TABLET | Freq: Every day | ORAL | 3 refills | Status: DC

## 2019-02-03 MED ORDER — ONDANSETRON HCL 4 MG PO TABS: 4.0000 mg | ORAL_TABLET | Freq: Three times a day (TID) | ORAL | 1 refills | Status: DC | PRN

## 2019-02-03 MED ORDER — HYDROCORTISONE ACETATE 25 MG RE SUPP: RECTAL | 2 refills | Status: DC

## 2019-02-03 NOTE — Patient Instructions (Signed)
Continue current medications. Go to lab for blood draw and urine collection. You will be contacted to schedule appt for CT ABD/pelvis (r/o rectal abscess and/or diverticulitis)

## 2019-02-03 NOTE — Progress Notes (Signed)
Virtual Visit via Video Note  I connected with Mary Collier on 02/03/19 at  8:45 AM EDT by a video enabled telemedicine application and verified that I am speaking with the correct person using two identifiers.   I discussed the limitations of evaluation and management by telemedicine and the availability of in person appointments. The patient expressed understanding and agreed to proceed.  CC: f/u on BP: last 3 days reading 02/02/19:139/80,4/13--123/78,4/11--104/63. has little cough--from new BP med?still do the CT? pt complaining of LLQ abd,nasuea/5-days/tylenol otc pt c/o of painful when have BM,nasuea,sick in stomach/going on for while and getting worse/has GI doc  History of Present Illness: Abdominal Pain  This is a recurrent problem. The current episode started more than 1 month ago. The onset quality is gradual. The problem occurs intermittently. The problem has been gradually worsening. The pain is located in the LLQ, periumbilical region and suprapubic region. The quality of the pain is colicky and a sensation of fullness. The abdominal pain radiates to the left flank. Associated symptoms include anorexia and nausea. Pertinent negatives include no belching, constipation, diarrhea, dysuria, fever, frequency, hematochezia, hematuria, melena, vomiting or weight loss. The pain is aggravated by bowel movement and palpation. The pain is relieved by bowel movements. She has tried acetaminophen for the symptoms. The treatment provided no relief. Her past medical history is significant for GERD.  Abd pain is associated with rectal pain (new symptoms). She has hx of hemorrhoids but does not feel any mass in rectal area. She did not make appt with GI as previous discussed. She did not schedule CT ABD/pelvis as previously ordered.  HTN: Improved with added lisinopril. Continues to use metoprolol. Denies any cough.  Observations/Objective: Physical Exam  Constitutional: She is oriented to person,  place, and time. No distress.  Neck: Normal range of motion. Neck supple.  Pulmonary/Chest: Effort normal.  Neurological: She is alert and oriented to person, place, and time.  Psychiatric: She has a normal mood and affect. Her behavior is normal. Thought content normal.  Vitals reviewed.  Assessment and Plan: Mary Collier was seen today for follow-up, abdominal pain and pain.  Diagnoses and all orders for this visit:  Colicky LLQ abdominal pain -     CBC w/Diff -     Cancel: Urinalysis w microscopic + reflex cultur -     Basic metabolic panel -     CT Abdomen Pelvis W Contrast; Future -     Lipase -     Amylase -     Urinalysis w microscopic + reflex cultur  Rectal pain -     CBC w/Diff -     CT Abdomen Pelvis W Contrast; Future -     hydrocortisone (ANUSOL-HC) 25 MG suppository; PLACE 1 SUPPOSITORY PER RECTUM TWICE A DAY  Nausea -     CBC w/Diff -     Cancel: Urinalysis w microscopic + reflex cultur -     CT Abdomen Pelvis W Contrast; Future -     Lipase -     Amylase -     ondansetron (ZOFRAN) 4 MG tablet; Take 1 tablet (4 mg total) by mouth every 8 (eight) hours as needed for nausea or vomiting. -     Urinalysis w microscopic + reflex cultur  Essential hypertension -     lisinopril (PRINIVIL,ZESTRIL) 5 MG tablet; Take 1 tablet (5 mg total) by mouth daily.   Follow Up Instructions: Continue current medications. Go to lab for blood draw and urine collection.  You will be contacted to schedule appt for CT ABD/pelvis (r/o rectal abscess and/or diverticulitis)   I discussed the assessment and treatment plan with the patient. The patient was provided an opportunity to ask questions and all were answered. The patient agreed with the plan and demonstrated an understanding of the instructions.   The patient was advised to call back or seek an in-person evaluation if the symptoms worsen or if the condition fails to improve as anticipated.   Wilfred Lacy, NP

## 2019-02-04 LAB — URINALYSIS W MICROSCOPIC + REFLEX CULTURE
Bacteria, UA: NONE SEEN /HPF
Bilirubin Urine: NEGATIVE
Glucose, UA: NEGATIVE
Hgb urine dipstick: NEGATIVE
Hyaline Cast: NONE SEEN /LPF
Ketones, ur: NEGATIVE
Leukocyte Esterase: NEGATIVE
Nitrites, Initial: NEGATIVE
Protein, ur: NEGATIVE
RBC / HPF: NONE SEEN /HPF (ref 0–2)
Specific Gravity, Urine: 1.023 (ref 1.001–1.03)
Squamous Epithelial / HPF: NONE SEEN /HPF (ref <=5)
WBC, UA: NONE SEEN /HPF (ref 0–5)
pH: <=5 (ref 5.0–8.0)

## 2019-02-04 LAB — CBC WITH DIFFERENTIAL/PLATELET
Basophils Absolute: 0.1 10*3/uL (ref 0.0–0.1)
Basophils Relative: 0.5 % (ref 0.0–3.0)
Eosinophils Absolute: 0.2 10*3/uL (ref 0.0–0.7)
Eosinophils Relative: 1.6 % (ref 0.0–5.0)
HCT: 38 % (ref 36.0–46.0)
Hemoglobin: 12.5 g/dL (ref 12.0–15.0)
Lymphocytes Relative: 21.2 % (ref 12.0–46.0)
Lymphs Abs: 2.4 10*3/uL (ref 0.7–4.0)
MCHC: 32.9 g/dL (ref 30.0–36.0)
MCV: 88.4 fl (ref 78.0–100.0)
Monocytes Absolute: 1 10*3/uL (ref 0.1–1.0)
Monocytes Relative: 8.8 % (ref 3.0–12.0)
Neutro Abs: 7.5 10*3/uL (ref 1.4–7.7)
Neutrophils Relative %: 67.9 % (ref 43.0–77.0)
Platelets: 229 10*3/uL (ref 150.0–400.0)
RBC: 4.3 Mil/uL (ref 3.87–5.11)
RDW: 13.6 % (ref 11.5–15.5)
WBC: 11.1 10*3/uL — ABNORMAL HIGH (ref 4.0–10.5)

## 2019-02-04 LAB — BASIC METABOLIC PANEL
BUN: 19 mg/dL (ref 6–23)
CO2: 27 mEq/L (ref 19–32)
Calcium: 9.4 mg/dL (ref 8.4–10.5)
Chloride: 104 mEq/L (ref 96–112)
Creatinine, Ser: 0.93 mg/dL (ref 0.40–1.20)
GFR: 72.71 mL/min (ref >60.00)
Glucose, Bld: 83 mg/dL (ref 70–99)
Potassium: 4.5 mEq/L (ref 3.5–5.1)
Sodium: 140 mEq/L (ref 135–145)

## 2019-02-04 LAB — AMYLASE: Amylase: 75 U/L (ref 27–131)

## 2019-02-04 LAB — NO CULTURE INDICATED

## 2019-02-04 LAB — LIPASE: Lipase: 24 U/L (ref 11.0–59.0)

## 2019-02-09 ENCOUNTER — Encounter (HOSPITAL_BASED_OUTPATIENT_CLINIC_OR_DEPARTMENT_OTHER): Payer: Self-pay

## 2019-02-09 ENCOUNTER — Ambulatory Visit (HOSPITAL_BASED_OUTPATIENT_CLINIC_OR_DEPARTMENT_OTHER)
Admission: RE | Admit: 2019-02-09 | Discharge: 2019-02-09 | Disposition: A | Discharge status: Home / Self Care | Payer: Managed Care, Other (non HMO) | Source: Ambulatory Visit | Attending: Nurse Practitioner | Admitting: Nurse Practitioner

## 2019-02-09 ENCOUNTER — Other Ambulatory Visit: Payer: Self-pay

## 2019-02-09 DIAGNOSIS — R1032 Left lower quadrant pain: Secondary | ICD-10-CM | POA: Diagnosis present

## 2019-02-09 DIAGNOSIS — K6289 Other specified diseases of anus and rectum: Secondary | ICD-10-CM | POA: Diagnosis present

## 2019-02-09 DIAGNOSIS — R110 Nausea: Secondary | ICD-10-CM | POA: Insufficient documentation

## 2019-02-09 MED ORDER — IOHEXOL 300 MG/ML  SOLN
100.0000 mL | Freq: Once | INTRAMUSCULAR | Status: AC | PRN
  Administered 2019-02-09: 12:00:00 100 mL via INTRAVENOUS

## 2019-03-26 ENCOUNTER — Other Ambulatory Visit: Payer: Self-pay | Admitting: Family Medicine

## 2019-03-26 NOTE — Telephone Encounter (Signed)
Requested Prescriptions  Pending Prescriptions Disp Refills  . fluticasone (FLONASE) 50 MCG/ACT nasal spray [Pharmacy Med Name: FLUTICASONE PROP 50 MCG SPRAY] 48 g 0    Sig: SPRAY 2 SPRAYS INTO EACH NOSTRIL EVERY DAY     There is no refill protocol information for this order

## 2019-03-29 ENCOUNTER — Ambulatory Visit (INDEPENDENT_AMBULATORY_CARE_PROVIDER_SITE_OTHER): Payer: Managed Care, Other (non HMO) | Admitting: Nurse Practitioner

## 2019-03-29 ENCOUNTER — Ambulatory Visit (INDEPENDENT_AMBULATORY_CARE_PROVIDER_SITE_OTHER): Payer: Managed Care, Other (non HMO)

## 2019-03-29 ENCOUNTER — Encounter: Payer: Self-pay | Admitting: Nurse Practitioner

## 2019-03-29 ENCOUNTER — Other Ambulatory Visit: Payer: Self-pay

## 2019-03-29 VITALS — BP 130/72 | HR 67 | Temp 98.4°F | Ht 62.0 in | Wt 181.0 lb

## 2019-03-29 DIAGNOSIS — M25562 Pain in left knee: Secondary | ICD-10-CM

## 2019-03-29 DIAGNOSIS — M1712 Unilateral primary osteoarthritis, left knee: Secondary | ICD-10-CM

## 2019-03-29 MED ORDER — DICLOFENAC SODIUM 1 % TD GEL: 2.0000 g | Freq: Three times a day (TID) | TRANSDERMAL | 0 refills | Status: DC | PRN

## 2019-03-29 NOTE — Patient Instructions (Signed)
Go to lab for knee x-ray. May use voltaren gel as needed for pain. Continue use of knee sleeve for support

## 2019-03-29 NOTE — Progress Notes (Signed)
Subjective:  Patient ID: Mary Collier, female    DOB: 1951/08/02  Age: 68 y.o. MRN: [id number]  CC: Leg Pain (pt is c/o left leg pain,behind knee and caft area and swelling/ going for a long time/ using ace wrap--help a little and tylenol PRN)  Leg Pain   The incident occurred more than 1 week ago (onset 62months ago). There was no injury mechanism. The pain is present in the left knee and left leg. The quality of the pain is described as aching. The pain has been constant since onset. Pertinent negatives include no inability to bear weight, loss of motion, muscle weakness, numbness or tingling. She reports no foreign bodies present. The symptoms are aggravated by palpation and weight bearing. She has tried rest and immobilization (use of knee sleeve) for the symptoms. The treatment provided mild relief.  GI intolerance to oral NSAIDs. Venous doppler done 12/2018: negative for DVT.  Reviewed past Medical, Social and Family history today.  Outpatient Medications Prior to Visit  Medication Sig Dispense Refill  . acetaminophen (TYLENOL) 325 MG tablet Take 325 mg by mouth as needed.      Marland Kitchen atorvastatin (LIPITOR) 10 MG tablet Take 1 tablet (10 mg total) by mouth daily. 90 tablet 3  . bimatoprost (LUMIGAN) 0.03 % ophthalmic solution Place 1 drop into both eyes at bedtime.      . calcium-vitamin D (OSCAL 500/200 D-3) 500-200 MG-UNIT per tablet Take 1 tablet by mouth 2 (two) times daily. 60 tablet 12  . CVS E 200 units capsule Take 200 Units by mouth daily.  0  . CVS SENNA PLUS 8.6-50 MG tablet TAKE 1 TABLET BY MOUTH DAILY. 90 tablet 1  . dimenhyDRINATE (DRAMAMINE) 50 MG tablet Take 50 mg by mouth every 8 (eight) hours as needed for dizziness.    . fluticasone (FLONASE) 50 MCG/ACT nasal spray SPRAY 2 SPRAYS INTO EACH NOSTRIL EVERY DAY 48 g 0  . folic acid (FOLVITE) 1 MG tablet Take 1 mg by mouth daily.    . hydrocortisone (ANUSOL-HC) 25 MG suppository PLACE 1 SUPPOSITORY PER RECTUM TWICE A DAY 28  suppository 2  . lisinopril (PRINIVIL,ZESTRIL) 5 MG tablet Take 1 tablet (5 mg total) by mouth daily. 90 tablet 3  . metoprolol succinate (TOPROL-XL) 25 MG 24 hr tablet Take 1 tablet (25 mg total) by mouth daily. 90 tablet 3  . triamcinolone (NASACORT ALLERGY 24HR) 55 MCG/ACT AERO nasal inhaler Place 2 sprays into the nose as needed.    . ondansetron (ZOFRAN) 4 MG tablet Take 1 tablet (4 mg total) by mouth every 8 (eight) hours as needed for nausea or vomiting. (Patient not taking: Reported on 03/29/2019) 30 tablet 1   No facility-administered medications prior to visit.     ROS See HPI  Objective:  BP 130/72   Pulse 67   Temp 98.4 F (36.9 C) (Oral)   Ht 5\' 2"  (1.575 m)   Wt 181 lb (82.1 kg)   SpO2 97%   BMI 33.11 kg/m   BP Readings from Last 3 Encounters:  03/29/19 130/72  02/03/19 (!) 143/77  01/20/19 (!) 155/85    Wt Readings from Last 3 Encounters:  03/29/19 181 lb (82.1 kg)  02/03/19 170 lb (77.1 kg)  01/20/19 170 lb (77.1 kg)    Physical Exam Vitals signs reviewed.  Cardiovascular:     Rate and Rhythm: Normal rate.  Pulmonary:     Effort: Pulmonary effort is normal.  Musculoskeletal:  General: Tenderness present. No swelling, deformity or signs of injury.     Right knee: Normal.     Left knee: She exhibits normal range of motion, no swelling, no effusion and no erythema. Tenderness found. Medial joint line tenderness noted. No lateral joint line and no patellar tendon tenderness noted.     Right ankle: Normal.     Left ankle: Normal.     Right lower leg: Normal. No edema.     Left lower leg: She exhibits tenderness. She exhibits no bony tenderness, no swelling and no laceration. No edema.     Left foot: Normal.  Skin:    Findings: No erythema.  Neurological:     Mental Status: She is alert and oriented to person, place, and time.    Lab Results  Component Value Date   WBC 11.1 (H) 02/03/2019   HGB 12.5 02/03/2019   HCT 38.0 02/03/2019   PLT  229.0 02/03/2019   GLUCOSE 83 02/03/2019   CHOL 139 12/07/2018   TRIG 104.0 12/07/2018   HDL 43.00 12/07/2018   LDLCALC 76 12/07/2018   ALT 14 12/07/2018   AST 15 12/07/2018   NA 140 02/03/2019   K 4.5 02/03/2019   CL 104 02/03/2019   CREATININE 0.93 02/03/2019   BUN 19 02/03/2019   CO2 27 02/03/2019   TSH 2.08 06/15/2018   HGBA1C 6.3 12/07/2018    Ct Abdomen Pelvis W Contrast  Result Date: 02/09/2019 CLINICAL DATA:  Acute left-sided abdominal pain. EXAM: CT ABDOMEN AND PELVIS WITH CONTRAST TECHNIQUE: Multidetector CT imaging of the abdomen and pelvis was performed using the standard protocol following bolus administration of intravenous contrast. CONTRAST:  158mL OMNIPAQUE IOHEXOL 300 MG/ML  SOLN COMPARISON:  CT scan of March 30, 2017. FINDINGS: Lower chest: No acute abnormality. Hepatobiliary: No focal liver abnormality is seen. No gallstones, gallbladder wall thickening, or biliary dilatation. Pancreas: Unremarkable. No pancreatic ductal dilatation or surrounding inflammatory changes. Spleen: Normal in size without focal abnormality. Adrenals/Urinary Tract: Adrenal glands are unremarkable. Kidneys are normal, without renal calculi, focal lesion, or hydronephrosis. Bladder is unremarkable. Stomach/Bowel: Stomach is within normal limits. Appendix appears normal. No evidence of bowel wall thickening, distention, or inflammatory changes. Vascular/Lymphatic: No significant vascular findings are present. No enlarged abdominal or pelvic lymph nodes. Reproductive: Status post hysterectomy. No adnexal masses. Other: No abdominal wall hernia or abnormality. No abdominopelvic ascites. Musculoskeletal: No acute or significant osseous findings. IMPRESSION: No acute abnormality seen in the abdomen or pelvis. Electronically Signed   By: Marijo Conception M.D.   On: 02/09/2019 12:06    Assessment & Plan:   Mary Collier was seen today for leg pain.  Diagnoses and all orders for this visit:  Medial knee pain,  left -     DG Knee Complete 4 Views Left -     diclofenac sodium (VOLTAREN) 1 % GEL; Apply 2 g topically 3 (three) times daily as needed.  Primary osteoarthritis of left knee   I am having Mary Collier start on diclofenac sodium. I am also having her maintain her bimatoprost, acetaminophen, folic acid, calcium-vitamin D, CVS Senna Plus, CVS E, dimenhyDRINATE, triamcinolone, atorvastatin, metoprolol succinate, hydrocortisone, lisinopril, ondansetron, and fluticasone.  Meds ordered this encounter  Medications  . diclofenac sodium (VOLTAREN) 1 % GEL    Sig: Apply 2 g topically 3 (three) times daily as needed.    Dispense:  100 g    Refill:  0    Order Specific Question:   Supervising Provider  Answer:   Lucille Passy [[id number]]    Problem List Items Addressed This Visit    None    Visit Diagnoses    Medial knee pain, left    -  Primary   Relevant Medications   diclofenac sodium (VOLTAREN) 1 % GEL   Other Relevant Orders   DG Knee Complete 4 Views Left (Completed)   Primary osteoarthritis of left knee           Follow-up: Return if symptoms worsen or fail to improve.  Wilfred Lacy, NP

## 2019-03-30 ENCOUNTER — Telehealth: Payer: Self-pay | Admitting: Nurse Practitioner

## 2019-03-30 NOTE — Telephone Encounter (Signed)
-----   Message from Shawnie Pons, LPN sent at 8/68/2574 10:46 AM EDT ----- Pt is aware but still wants to see Dr. Amil Amen for this first. Please advise.

## 2019-03-30 NOTE — Telephone Encounter (Signed)
LVM for the pt to call back. Need to inform test result--ok for triage nurse to give detail message.

## 2019-03-30 NOTE — Telephone Encounter (Signed)
Rheumatologist is not my medical recommendation, hence will not be entering a referral. Rheumatologist is recommended when an inflammatory disorder is suspected or diagnosed. That is not the case here. She can call his office an schedule an appt if she prefers to see him.

## 2019-04-22 ENCOUNTER — Ambulatory Visit: Payer: Self-pay | Admitting: *Deleted

## 2019-04-22 ENCOUNTER — Telehealth: Payer: Self-pay | Admitting: Nurse Practitioner

## 2019-04-22 DIAGNOSIS — I10 Essential (primary) hypertension: Secondary | ICD-10-CM

## 2019-04-22 NOTE — Telephone Encounter (Signed)
Summary: coughing due to medication    Patient called stating she is having issues with her coughing. She does believe its from her medication lisinopril (PRINIVIL,ZESTRIL) 5 MG. Patient did state her PCP said it is a side effect. Patient is seeking advice since office is closed.  Call back # [phone]       Pt stated she has a cough after starting on Lisinopril. She is advised to drink plenty of liquids, take honey and also lemon to soothe her throat. Also to use cough drops. She voiced understanding. She would like for her provider to give her call back regarding other medications that she could take for her b/p. Routing to the [facility] at [facility] for call back on Monday.  Reason for Disposition . Caller has NON-URGENT medication question about med that PCP prescribed and triager unable to answer question  Answer Assessment - Initial Assessment Questions 1. SYMPTOMS: "Do you have any symptoms?"     yes 2. SEVERITY: If symptoms are present, ask "Are they mild, moderate or severe?"     Mild  Protocols used: MEDICATION QUESTION CALL-A-AH

## 2019-04-22 NOTE — Telephone Encounter (Signed)
Patient called stating she is having issues with her coughing.  She does believe its from her medication lisinopril (PRINIVIL,ZESTRIL) 5 MG.  Patient did state her PCP said it is a side effect.  Patient would like nurse or PCP to get in touch with her regarding the next steps.   Since office was closed 7/3 I did make it a clinical call so she could get some advice as of right now.  Call back # [phone]

## 2019-04-25 MED ORDER — LOSARTAN POTASSIUM 25 MG PO TABS: 25.0000 mg | ORAL_TABLET | Freq: Every day | ORAL | 1 refills | Status: DC

## 2019-04-25 NOTE — Telephone Encounter (Signed)
Please advise 

## 2019-04-25 NOTE — Addendum Note (Signed)
Addended by: Wilfred Lacy L on: 04/25/2019 10:02 AM   Modules accepted: Orders

## 2019-04-25 NOTE — Telephone Encounter (Signed)
LVM for the pt to call back, need to inform the pt that [facility] change from lisinopril to Losartan, Rx sent.

## 2019-04-25 NOTE — Telephone Encounter (Signed)
Lisinopril changed to losartan

## 2019-04-26 NOTE — Telephone Encounter (Signed)
Pt is aware med change.

## 2019-05-09 ENCOUNTER — Telehealth: Payer: Self-pay | Admitting: Nurse Practitioner

## 2019-05-09 ENCOUNTER — Telehealth: Payer: Self-pay

## 2019-05-09 ENCOUNTER — Ambulatory Visit: Payer: Self-pay

## 2019-05-09 DIAGNOSIS — M25562 Pain in left knee: Secondary | ICD-10-CM

## 2019-05-09 NOTE — Telephone Encounter (Signed)
Pt verbalized understand of Mary Collier message below, pt stated she is checking her BP daily. pt report that she has 2 BP machine at home--the new one she just bought giving her reading start on 04/25/2019/-05/09/2019 of 134/80 all the time (pt stated this is new machine she just got). Ask the pt to take BP on the old machine she has and it was 135/79.   FYI--pt was wondering if she has to be on 2 BP medications, can Mary Collier give her something in combination so she can take one pill instead of two different pills? Please advise.

## 2019-05-09 NOTE — Telephone Encounter (Signed)
Lisinopril changed to losartan due to her compliant of cough. I can not make medication changes if she is not checking BP at home. Based on last OV and BP reading, she needs to maintain both medications.

## 2019-05-09 NOTE — Telephone Encounter (Signed)
At this time I do not know of any combination with metoprolol and losartan combined. Maintain current medications, and bring BP machines to next office visit

## 2019-05-09 NOTE — Telephone Encounter (Signed)
FYI--  Pt is aware of Mary Collier message below. Pt stated she will hold off on Losartan until she speak with Mary Collier. Offer sooner appt but the pt wants to wait on 06/14/2019--advised the pt to call if she needs sooner appt.

## 2019-05-09 NOTE — Telephone Encounter (Signed)
See other phone note

## 2019-05-09 NOTE — Telephone Encounter (Signed)
Pt was wondering why Baldo Ash put her on 2 BP med.   Advised the pt that metoprolol didn't control BP according to ov on 01/2019 that's why you added lisinopril (later changed to Losartan).   Pt has not started the Losartan yet but her home BP reading runs around 134/80 in the past 2-3 wks. Pt was wondering if she still need to take losartan? If so can [place] put her in like combination med (she doesn't want to take 2 pills separately).

## 2019-05-09 NOTE — Telephone Encounter (Signed)
Copied from [place] [phone]. Topic: Referral - Request for Referral >> May 09, 2019  9:00 AM Nils Flack wrote: Has patient seen PCP for this complaint? YES *If NO, is insurance requiring patient see PCP for this issue before PCP can refer them? Referral for which specialty: ortho Preferred provider/office: [facility] [place] - pt has been there a long time ago  Reason for referral: left leg pain and swelling

## 2019-05-09 NOTE — Telephone Encounter (Signed)
Please advise, ok to enter referral?

## 2019-05-09 NOTE — Telephone Encounter (Signed)
CPE scheduled. Pt has enough meds until then.

## 2019-05-09 NOTE — Addendum Note (Signed)
Addended byShawnie Pons on: 05/09/2019 11:11 AM   Modules accepted: Orders

## 2019-05-09 NOTE — Telephone Encounter (Signed)
Error about CPE--

## 2019-05-09 NOTE — Telephone Encounter (Signed)
Pt is aware.  

## 2019-05-09 NOTE — Telephone Encounter (Signed)
Ok to enter referral. Associate to L. knee pain

## 2019-05-09 NOTE — Telephone Encounter (Signed)
Outgoing call to Patient .  Patient questions why she was prescribed another blood prescription.  Patient has not started the [place].  Last blood pressure she obtained was.  139/80.  Denies any Sx.  Patient request a return phone call from Provider.  Patient is unsure why she needs to take medication  Patient waits return phone call to  [phone].       Reason for Disposition . [1] Caller has medication question about med not prescribed by PCP AND [2] triager unable to answer question (e.g., compatibility with other med, storage)  Answer Assessment - Initial Assessment Questions 1.   NAME of MEDICATION: "What medicine are you calling about?"     Lasarten 2.   QUESTION: "What is your question?"    "why am on two different b/ood Pressure medications?  Have not started Lasarten yet.  3.   PRESCRIBING HCP: "Who prescribed it?" Reason: if prescribed by specialist, call should be referred to that group.     Dr.  Lorayne Marek 4. SYMPTOMS: "Do you have any symptoms?"     Denies 5. SEVERITY: If symptoms are present, ask "Are they mild, moderate or severe?"     Na 6.  PREGNANCY:  "Is there any chance that you are pregnant?" "When was your last menstrual period?"     na  Protocols used: MEDICATION QUESTION CALL-A-AH

## 2019-05-11 ENCOUNTER — Encounter: Payer: Self-pay | Admitting: Gastroenterology

## 2019-05-23 DIAGNOSIS — M25562 Pain in left knee: Secondary | ICD-10-CM | POA: Insufficient documentation

## 2019-06-04 ENCOUNTER — Other Ambulatory Visit: Payer: Self-pay | Admitting: Nurse Practitioner

## 2019-06-04 DIAGNOSIS — M25562 Pain in left knee: Secondary | ICD-10-CM

## 2019-06-10 ENCOUNTER — Encounter: Payer: Managed Care, Other (non HMO) | Admitting: Nurse Practitioner

## 2019-06-14 ENCOUNTER — Encounter: Payer: Self-pay | Admitting: Nurse Practitioner

## 2019-06-14 ENCOUNTER — Ambulatory Visit (INDEPENDENT_AMBULATORY_CARE_PROVIDER_SITE_OTHER): Payer: Managed Care, Other (non HMO) | Admitting: Nurse Practitioner

## 2019-06-14 ENCOUNTER — Other Ambulatory Visit: Payer: Self-pay

## 2019-06-14 VITALS — BP 150/94 | HR 71 | Temp 97.7°F | Ht 62.64 in | Wt 182.4 lb

## 2019-06-14 DIAGNOSIS — E782 Mixed hyperlipidemia: Secondary | ICD-10-CM | POA: Diagnosis not present

## 2019-06-14 DIAGNOSIS — I10 Essential (primary) hypertension: Secondary | ICD-10-CM

## 2019-06-14 DIAGNOSIS — Z0001 Encounter for general adult medical examination with abnormal findings: Secondary | ICD-10-CM | POA: Diagnosis not present

## 2019-06-14 DIAGNOSIS — M25551 Pain in right hip: Secondary | ICD-10-CM

## 2019-06-14 DIAGNOSIS — Z780 Asymptomatic menopausal state: Secondary | ICD-10-CM

## 2019-06-14 DIAGNOSIS — R7303 Prediabetes: Secondary | ICD-10-CM

## 2019-06-14 DIAGNOSIS — M25552 Pain in left hip: Secondary | ICD-10-CM

## 2019-06-14 DIAGNOSIS — K219 Gastro-esophageal reflux disease without esophagitis: Secondary | ICD-10-CM | POA: Diagnosis not present

## 2019-06-14 LAB — COMPREHENSIVE METABOLIC PANEL
ALT: 6 U/L (ref 0–35)
AST: 15 U/L (ref 0–37)
Albumin: 4.2 g/dL (ref 3.5–5.2)
Alkaline Phosphatase: 53 U/L (ref 39–117)
BUN: 11 mg/dL (ref 6–23)
CO2: 25 mEq/L (ref 19–32)
Calcium: 9.3 mg/dL (ref 8.4–10.5)
Chloride: 105 mEq/L (ref 96–112)
Creatinine, Ser: 0.77 mg/dL (ref 0.40–1.20)
GFR: 90.31 mL/min (ref >60.00)
Glucose, Bld: 89 mg/dL (ref 70–99)
Potassium: 3.9 mEq/L (ref 3.5–5.1)
Sodium: 138 mEq/L (ref 135–145)
Total Bilirubin: 0.5 mg/dL (ref 0.2–1.2)
Total Protein: 7.2 g/dL (ref 6.0–8.3)

## 2019-06-14 LAB — HEMOGLOBIN A1C: Hgb A1c MFr Bld: 6.6 % — ABNORMAL HIGH (ref 4.6–6.5)

## 2019-06-14 LAB — TSH: TSH: 0.98 u[IU]/mL (ref 0.35–4.50)

## 2019-06-14 MED ORDER — OMEPRAZOLE 40 MG PO CPDR: 40.0000 mg | DELAYED_RELEASE_CAPSULE | Freq: Every day | ORAL | 0 refills | Status: DC

## 2019-06-14 NOTE — Assessment & Plan Note (Signed)
Reports home BP 130s-140s/80s. Refuses to take losartan. Taking only metoprolol. She is aware of possible comlications with uncontrolled hypertension

## 2019-06-14 NOTE — Progress Notes (Signed)
Subjective:    Patient ID: Mary Collier, female    DOB: May 12, 1951, 69 y.o.   MRN: [id number]  Patient presents today for complete physical and eval of HTN and hip pain.  Hip Pain  The incident occurred more than 1 week ago (ongoing for several years). There was no injury mechanism. The pain is present in the left hip and right hip. The quality of the pain is described as aching. The pain has been intermittent since onset. Pertinent negatives include no inability to bear weight, loss of motion, loss of sensation, muscle weakness, numbness or tingling. The symptoms are aggravated by weight bearing and palpation. She has tried rest for the symptoms. The treatment provided significant relief.  hip pain is worse with prolong walking, describes as "hip stiffness and heaviness in legs after walking about 34mile"  HTN:  elevated BP She reports home BP 130s-140s/80s She refuses to taking losartan. BP Readings from Last 3 Encounters:  06/14/19 (!) 150/94  03/29/19 130/72  02/03/19 (!) 143/77   Sexual History (orientation,birth control, marital status, STD):single, not sexually active, postmenopausal, upcoming mammogram in 2weeks, declined referral for Bone density  Colonoscopy scheduled in 2weeks.  Depression/Suicide: Depression screen [facility] 2/9 12/07/2018 06/15/2018 04/09/2016 07/12/2014 04/11/2014 03/29/2013  Decreased Interest 0 0 0 0 0 0  Down, Depressed, Hopeless 0 0 0 0 0 0  PHQ - 2 Score 0 0 0 0 0 0   MMSE - Mini Mental State Exam 06/15/2018 04/09/2016  Not completed: - (No Data)  Orientation to time 5 5  Orientation to Place 5 5  Registration 3 3  Attention/ Calculation - 5  Recall 3 3  Language- name 2 objects 2 2  Language- repeat 1 1  Language- follow 3 step command 3 3  Language- read & follow direction 1 1  Write a sentence 1 1  Copy design 1 1  Total score - 30   Vision:upcoming appt in 2weeks  Dental:up coming appt in 2weeks  Immunizations: (TDAP, Hep C screen,  Pneumovax, Influenza, zoster)  Health Maintenance  Topic Date Due  . DEXA scan (bone density measurement)  11/06/2016  . Mammogram  06/19/2018  . Colon Cancer Screening  05/24/2019  . Flu Shot  05/21/2019  . Tetanus Vaccine  06/16/2019*  .  Hepatitis C: One time screening is recommended by Center for Disease Control  (CDC) for  adults born from [birth year] through 1965.   06/16/2019*  . Pneumonia vaccines  Completed  *Topic was postponed. The date shown is not the original due date.   Diet:heart healthy.  Weight:  Wt Readings from Last 3 Encounters:  06/14/19 182 lb 6.4 oz (82.7 kg)  03/29/19 181 lb (82.1 kg)  02/03/19 170 lb (77.1 kg)   Exercise:walking, limited due to chronic hip pain  Fall Risk: Fall Risk  12/07/2018 06/15/2018 04/08/2017 03/25/2017 01/01/2017 04/09/2016 04/03/2015  Falls in the past year? 0 No No No No No No  Number falls in past yr: - - - - - - -  Injury with Fall? - - - - - - -  Risk for fall due to : - - - - - - -  Risk for fall due to: Comment - - - - - - -   Advanced Directive: Advanced Directives 05/04/2017  Does Patient Have a Medical Advance Directive? No  Would patient like information on creating a medical advance directive? -  Pre-existing out of facility DNR order (yellow form or pink MOST form) -  Medications and allergies reviewed with patient and updated if appropriate.  Patient Active Problem List   Diagnosis Date Noted  . GERD (gastroesophageal reflux disease) 07/12/2014  . Prediabetes 07/12/2014  . Rash and nonspecific skin eruption 07/12/2014  . Fibromyalgia 04/27/2013  . Special screening for malignant neoplasms, colon 03/30/2013  . Unspecified vitamin D deficiency 03/30/2013  . HTN (hypertension) 03/30/2013  . Hyperlipidemia 03/30/2013  . Other malaise and fatigue 03/30/2013  . Unspecified constipation 03/30/2013  . Routine general medical examination at a health care facility 03/30/2013  . Hyperglycemia 03/30/2013  . Pulmonary nodule  04/16/2011    Current Outpatient Medications on File Prior to Visit  Medication Sig Dispense Refill  . acetaminophen (TYLENOL) 325 MG tablet Take 325 mg by mouth as needed.      Marland Kitchen atorvastatin (LIPITOR) 10 MG tablet Take 1 tablet (10 mg total) by mouth daily. 90 tablet 3  . bimatoprost (LUMIGAN) 0.03 % ophthalmic solution Place 1 drop into both eyes at bedtime.      . calcium-vitamin D (OSCAL 500/200 D-3) 500-200 MG-UNIT per tablet Take 1 tablet by mouth 2 (two) times daily. 60 tablet 12  . [employer]S E 200 units capsule Take 200 Units by mouth daily.  0  . [facility]S SENNA PLUS 8.6-50 MG tablet TAKE 1 TABLET BY MOUTH DAILY. 90 tablet 1  . diclofenac sodium (VOLTAREN) 1 % GEL APPLY 2 G TOPICALLY 3 (THREE) TIMES DAILY AS NEEDED. 100 g 0  . dimenhyDRINATE (DRAMAMINE) 50 MG tablet Take 50 mg by mouth every 8 (eight) hours as needed for dizziness.    . fluticasone (FLONASE) 50 MCG/ACT nasal spray SPRAY 2 SPRAYS INTO EACH NOSTRIL EVERY DAY 48 g 0  . folic acid (FOLVITE) 1 MG tablet Take 1 mg by mouth daily.    . hydrocortisone (ANUSOL-HC) 25 MG suppository PLACE 1 SUPPOSITORY PER RECTUM TWICE A DAY 28 suppository 2  . losartan (COZAAR) 25 MG tablet Take 1 tablet (25 mg total) by mouth daily. 90 tablet 1  . metoprolol succinate (TOPROL-XL) 25 MG 24 hr tablet Take 1 tablet (25 mg total) by mouth daily. 90 tablet 3  . ondansetron (ZOFRAN) 4 MG tablet Take 1 tablet (4 mg total) by mouth every 8 (eight) hours as needed for nausea or vomiting. 30 tablet 1  . triamcinolone (NASACORT ALLERGY 24HR) 55 MCG/ACT AERO nasal inhaler Place 2 sprays into the nose as needed.     No current facility-administered medications on file prior to visit.     Past Medical History:  Diagnosis Date  . Allergic rhinitis   . Allergy   . Arthritis   . Asthma   . Blood transfusion without reported diagnosis    1966  . Cancer ([place])    at age 27.  abdominal  . Cancer of kidney ([place])    at age 39.  Right kidney.  . Chronic headache    . Edema   . GERD (gastroesophageal reflux disease)   . Glaucoma   . Hiatal hernia   . Hypertension   . IBS (irritable bowel syndrome)   . Internal hemorrhoid   . Lumbago   . Lump or mass in breast   . Lung cancer ([place])    at age 6  Right lung.  . Other abnormal blood chemistry   . Other and unspecified hyperlipidemia   . Other convulsions    hx seizures at age 66  . Peptic stricture of esophagus   . Reflux esophagitis   .  Tubular adenoma of colon 2015  . Unspecified menopausal and postmenopausal disorder   . Unspecified vitamin D deficiency     Past Surgical History:  Procedure Laterality Date  . ABDOMINAL SURGERY  at age 72   . BREAST SURGERY  1970s and 1980s   Bilateral cysts removed  . KIDNEY SURGERY  at age 49   Right  . LUNG SURGERY  at age 39   Right  . NASAL SINUS SURGERY  1980s  . TONSILLECTOMY  1970  . TOTAL ABDOMINAL HYSTERECTOMY  2000s    Social History   Socioeconomic History  . Marital status: Married    Spouse name: Not on file  . Number of children: 0  . Years of education: Not on file  . Highest education level: Not on file  Occupational History  . Occupation: Investment banker, corporate (preschool)   Social Needs  . Financial resource strain: Not on file  . Food insecurity    Worry: Not on file    Inability: Not on file  . Transportation needs    Medical: Not on file    Non-medical: Not on file  Tobacco Use  . Smoking status: Never Smoker  . Smokeless tobacco: Never Used  Substance and Sexual Activity  . Alcohol use: No  . Drug use: No  . Sexual activity: Not on file  Lifestyle  . Physical activity    Days per week: Not on file    Minutes per session: Not on file  . Stress: Not on file  Relationships  . Social Herbalist on phone: Not on file    Gets together: Not on file    Attends religious service: Not on file    Active member of club or organization: Not on file    Attends meetings of clubs or organizations: Not on file     Relationship status: Not on file  Other Topics Concern  . Not on file  Social History Narrative   Pt has 18 siblings.   No caffeine drinks     Family History  Problem Relation Age of Onset  . Allergies Sister        x3  . Asthma Sister   . Bone cancer Father   . Lung cancer Father   . Stomach cancer Father   . Esophageal cancer Father   . Breast cancer Mother   . Cancer Mother   . Cancer Brother   . Hypertension Brother   . Prostate cancer Brother   . Cancer Brother        bone cancer  . Diabetes Brother   . Heart disease Sister   . Diabetes Sister   . Hypertension Sister   . Breast cancer Sister        mastectomy  . Stroke Sister   . Heart disease Other        2 siblings  . Colon cancer Neg Hx   . Rectal cancer Neg Hx   . Liver cancer Neg Hx         Review of Systems  Constitutional: Negative for malaise/fatigue and weight loss.  Eyes:       Negative for visual changes  Respiratory: Negative for shortness of breath.   Cardiovascular: Negative for palpitations and leg swelling.  Gastrointestinal: Negative for blood in stool, constipation and heartburn.  Genitourinary: Negative for frequency and urgency.  Musculoskeletal: Positive for back pain, joint pain and myalgias. Negative for falls.  Neurological: Negative for dizziness, tingling, sensory  change and numbness.  Endo/Heme/Allergies: Does not bruise/bleed easily.  Psychiatric/Behavioral: Negative for depression, memory loss, substance abuse and suicidal ideas. The patient is not nervous/anxious and does not have insomnia.     Objective:   Vitals:   06/14/19 0830  BP: (!) 150/94  Pulse: 71  Temp: 97.7 F (36.5 C)  SpO2: 100%    Body mass index is 32.69 kg/m.   Physical Examination:  Physical Exam Vitals signs reviewed.  Constitutional:      General: She is not in acute distress.    Appearance: She is well-developed.  HENT:     Right Ear: Tympanic membrane, ear canal and external ear  normal.     Left Ear: Tympanic membrane, ear canal and external ear normal.     Nose: Nose normal.     Mouth/Throat:     Pharynx: No oropharyngeal exudate.  Eyes:     Conjunctiva/sclera: Conjunctivae normal.     Pupils: Pupils are equal, round, and reactive to light.  Neck:     Musculoskeletal: Normal range of motion and neck supple.     Thyroid: No thyroid mass, thyromegaly or thyroid tenderness.  Cardiovascular:     Rate and Rhythm: Normal rate and regular rhythm.     Pulses: Normal pulses.     Heart sounds: Normal heart sounds.  Pulmonary:     Effort: Pulmonary effort is normal. No respiratory distress.     Breath sounds: Normal breath sounds.  Chest:     Chest wall: No tenderness.  Abdominal:     General: Bowel sounds are normal.     Palpations: Abdomen is soft.  Genitourinary:    Comments: Deferred breast and pelvic exam to GYN per patient Musculoskeletal: Normal range of motion.        General: Tenderness present.     Right hip: She exhibits tenderness. She exhibits normal range of motion, normal strength, no bony tenderness and no crepitus.     Left hip: She exhibits tenderness. She exhibits normal range of motion, normal strength and no bony tenderness.     Lumbar back: Normal.     Right upper leg: Normal.     Left upper leg: Normal.     Right lower leg: No edema.     Left lower leg: No edema.  Lymphadenopathy:     Cervical: No cervical adenopathy.  Skin:    General: Skin is warm and dry.     Findings: No erythema or rash.  Neurological:     General: No focal deficit present.     Mental Status: She is alert and oriented to person, place, and time.     Cranial Nerves: Cranial nerves are intact.     Motor: Motor function is intact.     Coordination: Coordination is intact.     Deep Tendon Reflexes: Reflexes are normal and symmetric. Babinski sign absent on the right side. Babinski sign absent on the left side.     Reflex Scores:      Patellar reflexes are 2+ on  the right side and 2+ on the left side.      Achilles reflexes are 2+ on the right side and 2+ on the left side. Psychiatric:        Mood and Affect: Mood normal.        Behavior: Behavior normal.        Thought Content: Thought content normal.    ASSESSMENT and PLAN:  [place] was seen today for annual exam.  Diagnoses and all orders for this visit:  Encounter for preventative adult health care exam with abnormal findings  Mixed hyperlipidemia -     Comprehensive metabolic panel  Essential hypertension -     Comprehensive metabolic panel -     TSH  Prediabetes -     Hemoglobin A1c  Gastroesophageal reflux disease without esophagitis -     omeprazole (PRILOSEC) 40 MG capsule; Take 1 capsule (40 mg total) by mouth daily.  Pain of both hip joints -     Ambulatory referral to Physical Therapy  Postmenopausal estrogen deficiency -     DG Bone Density; Future   HTN (hypertension) Reports home BP 130s-140s/80s. Refuses to take losartan. Taking only metoprolol. She is aware of possible comlications with uncontrolled hypertension      Problem List Items Addressed This Visit      Cardiovascular and Mediastinum   HTN (hypertension)    Reports home BP 130s-140s/80s. Refuses to take losartan. Taking only metoprolol. She is aware of possible comlications with uncontrolled hypertension      Relevant Orders   Comprehensive metabolic panel (Completed)   TSH (Completed)     Digestive   GERD (gastroesophageal reflux disease)   Relevant Medications   omeprazole (PRILOSEC) 40 MG capsule     Other   Hyperlipidemia   Relevant Orders   Comprehensive metabolic panel (Completed)   Prediabetes   Relevant Orders   Hemoglobin A1c (Completed)    Other Visit Diagnoses    Encounter for preventative adult health care exam with abnormal findings    -  Primary   Pain of both hip joints       Relevant Orders   Ambulatory referral to Physical Therapy   Postmenopausal estrogen  deficiency       Relevant Orders   DG Bone Density      Follow up: Return in about 3 months (around 09/14/2019) for HTN (F2F, bring BP machine).  Wilfred Lacy, NP

## 2019-06-14 NOTE — Patient Instructions (Addendum)
Normal CMP and TSh. Increase in Hgb A1c: 6.3 to 6.6. This indicates diabetes. It is important to make makes to diet (low fat and low carb). I do understand you are having difficulty with walking due to hip pain. Try walking short distances for exercise. Will you be interested in referral to nutritionist? Maintain f/up in 72month to repeat labs and re eval of BP (bring your BP machine). I also entered referral for repeat bone density.  You will be contacted to schedule appt for physical therapy. Perform stretches exercises before and after walking.  Start omeprazole: cough due to uncontrolled GERD. If no improvement in 2weeks, will refer to ENT.  Maintain DASH diet.  Preventive Care 634Years and Older, Female Preventive care refers to lifestyle choices and visits with your health care provider that can promote health and wellness. This includes:  A yearly physical exam. This is also called an annual well check.  Regular dental and eye exams.  Immunizations.  Screening for certain conditions.  Healthy lifestyle choices, such as diet and exercise. What can I expect for my preventive care visit? Physical exam Your health care provider will check:  Height and weight. These may be used to calculate body mass index (BMI), which is a measurement that tells if you are at a healthy weight.  Heart rate and blood pressure.  Your skin for abnormal spots. Counseling Your health care provider may ask you questions about:  Alcohol, tobacco, and drug use.  Emotional well-being.  Home and relationship well-being.  Sexual activity.  Eating habits.  History of falls.  Memory and ability to understand (cognition).  Work and work eStatistician  Pregnancy and menstrual history. What immunizations do I need?  Influenza (flu) vaccine  This is recommended every year. Tetanus, diphtheria, and pertussis (Tdap) vaccine  You may need a Td booster every 10 years. Varicella (chickenpox)  vaccine  You may need this vaccine if you have not already been vaccinated. Zoster (shingles) vaccine  You may need this after age 67 Pneumococcal conjugate (PCV13) vaccine  One dose is recommended after age 67 Pneumococcal polysaccharide (PPSV23) vaccine  One dose is recommended after age 67 Measles, mumps, and rubella (MMR) vaccine  You may need at least one dose of MMR if you were born in 1957 or later. You may also need a second dose. Meningococcal conjugate (MenACWY) vaccine  You may need this if you have certain conditions. Hepatitis A vaccine  You may need this if you have certain conditions or if you travel or work in places where you may be exposed to hepatitis A. Hepatitis B vaccine  You may need this if you have certain conditions or if you travel or work in places where you may be exposed to hepatitis B. Haemophilus influenzae type b (Hib) vaccine  You may need this if you have certain conditions. You may receive vaccines as individual doses or as more than one vaccine together in one shot (combination vaccines). Talk with your health care provider about the risks and benefits of combination vaccines. What tests do I need? Blood tests  Lipid and cholesterol levels. These may be checked every 5 years, or more frequently depending on your overall health.  Hepatitis C test.  Hepatitis B test. Screening  Lung cancer screening. You may have this screening every year starting at age 4634if you have a 30-pack-year history of smoking and currently smoke or have quit within the past 15 years.  Colorectal cancer screening. All adults should  have this screening starting at age 41 and continuing until age 71. Your health care provider may recommend screening at age 71 if you are at increased risk. You will have tests every 1-10 years, depending on your results and the type of screening test.  Diabetes screening. This is done by checking your blood sugar (glucose) after you  have not eaten for a while (fasting). You may have this done every 1-3 years.  Mammogram. This may be done every 1-2 years. Talk with your health care provider about how often you should have regular mammograms.  BRCA-related cancer screening. This may be done if you have a family history of breast, ovarian, tubal, or peritoneal cancers. Other tests  Sexually transmitted disease (STD) testing.  Bone density scan. This is done to screen for osteoporosis. You may have this done starting at age 73. Follow these instructions at home: Eating and drinking  Eat a diet that includes fresh fruits and vegetables, whole grains, lean protein, and low-fat dairy products. Limit your intake of foods with high amounts of sugar, saturated fats, and salt.  Take vitamin and mineral supplements as recommended by your health care provider.  Do not drink alcohol if your health care provider tells you not to drink.  If you drink alcohol: ? Limit how much you have to 0-1 drink a day. ? Be aware of how much alcohol is in your drink. In the [place] one drink equals one 12 oz bottle of beer (355 mL), one 5 oz glass of wine (148 mL), or one 1 oz glass of hard liquor (44 mL). Lifestyle  Take daily care of your teeth and gums.  Stay active. Exercise for at least 30 minutes on 5 or more days each week.  Do not use any products that contain nicotine or tobacco, such as cigarettes, e-cigarettes, and chewing tobacco. If you need help quitting, ask your health care provider.  If you are sexually active, practice safe sex. Use a condom or other form of protection in order to prevent STIs (sexually transmitted infections).  Talk with your health care provider about taking a low-dose aspirin or statin. What's next?  Go to your health care provider once a year for a well check visit.  Ask your health care provider how often you should have your eyes and teeth checked.  Stay up to date on all vaccines. This  information is not intended to replace advice given to you by your health care provider. Make sure you discuss any questions you have with your health care provider. Document Released: 11/02/2015 Document Revised: 09/30/2018 Document Reviewed: 09/30/2018 Elsevier Patient Education  2020 [employer].

## 2019-06-15 DIAGNOSIS — Z780 Asymptomatic menopausal state: Secondary | ICD-10-CM | POA: Insufficient documentation

## 2019-06-20 ENCOUNTER — Other Ambulatory Visit: Payer: Self-pay | Admitting: Nurse Practitioner

## 2019-07-14 ENCOUNTER — Ambulatory Visit (INDEPENDENT_AMBULATORY_CARE_PROVIDER_SITE_OTHER)
Admission: RE | Admit: 2019-07-14 | Discharge: 2019-07-14 | Disposition: A | Discharge status: Home / Self Care | Payer: Managed Care, Other (non HMO) | Source: Ambulatory Visit | Attending: Nurse Practitioner | Admitting: Nurse Practitioner

## 2019-07-14 ENCOUNTER — Other Ambulatory Visit: Payer: Self-pay

## 2019-07-14 DIAGNOSIS — Z780 Asymptomatic menopausal state: Secondary | ICD-10-CM

## 2019-08-01 ENCOUNTER — Encounter: Payer: Self-pay | Admitting: Podiatry

## 2019-08-01 ENCOUNTER — Ambulatory Visit: Payer: Managed Care, Other (non HMO) | Admitting: Podiatry

## 2019-08-01 ENCOUNTER — Other Ambulatory Visit: Payer: Self-pay

## 2019-08-01 VITALS — BP 171/97 | HR 71

## 2019-08-01 DIAGNOSIS — L84 Corns and callosities: Secondary | ICD-10-CM | POA: Diagnosis not present

## 2019-08-01 DIAGNOSIS — Q828 Other specified congenital malformations of skin: Secondary | ICD-10-CM | POA: Diagnosis not present

## 2019-08-01 DIAGNOSIS — M79675 Pain in left toe(s): Secondary | ICD-10-CM | POA: Diagnosis not present

## 2019-08-01 DIAGNOSIS — B351 Tinea unguium: Secondary | ICD-10-CM

## 2019-08-01 DIAGNOSIS — M79674 Pain in right toe(s): Secondary | ICD-10-CM | POA: Diagnosis not present

## 2019-08-01 DIAGNOSIS — R7303 Prediabetes: Secondary | ICD-10-CM

## 2019-08-01 NOTE — Progress Notes (Signed)
Subjective: Mary Collier presents today referred by Flossie Buffy, NP with cc of painful, discolored, thick toenails which interfere with daily activities.  Pain is aggravated when wearing enclosed shoe gear.   She has been seen in our office before in 2017.  She admits to being prediabetic. Denies any h/o foot wounds. Denies numbness, tingling, burning or pins/needles sensations in feet.  She relates thick, loose nailplate right hallux which is tender when wearing shoe gear. Denies any drainage, redness or swelling of digit.  Patient also c/o painful plantar lesions b/l feet. Pain is aggravated when weightbearing which and without shoe gear. She would like those treated today as well.  Past Medical History:  Diagnosis Date  . Allergic rhinitis   . Allergy   . Arthritis   . Asthma   . Blood transfusion without reported diagnosis    1966  . Cancer ([place])    at age 75.  abdominal  . Cancer of kidney ([place])    at age 47.  Right kidney.  . Chronic headache   . Edema   . GERD (gastroesophageal reflux disease)   . Glaucoma   . Hiatal hernia   . Hypertension   . IBS (irritable bowel syndrome)   . Internal hemorrhoid   . Lumbago   . Lump or mass in breast   . Lung cancer ([place])    at age 39  Right lung.  . Other abnormal blood chemistry   . Other and unspecified hyperlipidemia   . Other convulsions    hx seizures at age 41  . Peptic stricture of esophagus   . Reflux esophagitis   . Tubular adenoma of colon 2015  . Unspecified menopausal and postmenopausal disorder   . Unspecified vitamin D deficiency      Patient Active Problem List   Diagnosis Date Noted  . Postmenopausal estrogen deficiency 06/15/2019  . GERD (gastroesophageal reflux disease) 07/12/2014  . Prediabetes 07/12/2014  . Rash and nonspecific skin eruption 07/12/2014  . Fibromyalgia 04/27/2013  . Special screening for malignant neoplasms, colon 03/30/2013  . Unspecified vitamin D deficiency 03/30/2013  .  HTN (hypertension) 03/30/2013  . Hyperlipidemia 03/30/2013  . Other malaise and fatigue 03/30/2013  . Unspecified constipation 03/30/2013  . Routine general medical examination at a health care facility 03/30/2013  . Hyperglycemia 03/30/2013  . Pulmonary nodule 04/16/2011     Past Surgical History:  Procedure Laterality Date  . ABDOMINAL SURGERY  at age 54   . BREAST SURGERY  1970s and 1980s   Bilateral cysts removed  . KIDNEY SURGERY  at age 80   Right  . LUNG SURGERY  at age 14   Right  . NASAL SINUS SURGERY  1980s  . TONSILLECTOMY  1970  . TOTAL ABDOMINAL HYSTERECTOMY  2000s     Current Outpatient Medications on File Prior to Visit  Medication Sig Dispense Refill  . acetaminophen (TYLENOL) 325 MG tablet Take 325 mg by mouth as needed.      Marland Kitchen atorvastatin (LIPITOR) 10 MG tablet Take 1 tablet (10 mg total) by mouth daily. 90 tablet 3  . bimatoprost (LUMIGAN) 0.03 % ophthalmic solution Place 1 drop into both eyes at bedtime.      . calcium-vitamin D (OSCAL 500/200 D-3) 500-200 MG-UNIT per tablet Take 1 tablet by mouth 2 (two) times daily. 60 tablet 12  . [facility]S E 200 units capsule Take 200 Units by mouth daily.  0  . [facility] SENNA PLUS 8.6-50 MG tablet TAKE  1 TABLET BY MOUTH DAILY. 90 tablet 1  . diclofenac sodium (VOLTAREN) 1 % GEL APPLY 2 G TOPICALLY 3 (THREE) TIMES DAILY AS NEEDED. 100 g 0  . dimenhyDRINATE (DRAMAMINE) 50 MG tablet Take 50 mg by mouth every 8 (eight) hours as needed for dizziness.    . fluticasone (FLONASE) 50 MCG/ACT nasal spray SPRAY 2 SPRAYS INTO EACH NOSTRIL EVERY DAY 48 mL 0  . folic acid (FOLVITE) 1 MG tablet Take 1 mg by mouth daily.    . hydrocortisone (ANUSOL-HC) 25 MG suppository PLACE 1 SUPPOSITORY PER RECTUM TWICE A DAY 28 suppository 2  . ipratropium (ATROVENT) 0.03 % nasal spray     . losartan (COZAAR) 25 MG tablet Take 1 tablet (25 mg total) by mouth daily. 90 tablet 1  . metoprolol succinate (TOPROL-XL) 25 MG 24 hr tablet Take 1 tablet (25 mg total)  by mouth daily. 90 tablet 3  . omeprazole (PRILOSEC) 40 MG capsule Take 1 capsule (40 mg total) by mouth daily. 90 capsule 0  . ondansetron (ZOFRAN) 4 MG tablet Take 1 tablet (4 mg total) by mouth every 8 (eight) hours as needed for nausea or vomiting. 30 tablet 1  . triamcinolone (NASACORT ALLERGY 24HR) 55 MCG/ACT AERO nasal inhaler Place 2 sprays into the nose as needed.     No current facility-administered medications on file prior to visit.      Allergies  Allergen Reactions  . Influenza Vaccines     D/t being allergic to eggs  . Shrimp [Shellfish Allergy] Swelling    Lip, throat, tongue swelling  . Other Itching    Newspaper Dye  . Celecoxib     Able to take IBU  . Codeine Swelling  . Compazine [Prochlorperazine]   . Lisinopril Cough    cough  . Peanut-Containing Drug Products     Itching  . Prednisone     Rapid heart rate  . Prochlorperazine Edisylate     paralyzed  . Robinul [Glycopyrrolate]     boils  . Vioxx [Rofecoxib]     States able to take IBU  . Aspirin Rash    States able to take IBU  . Eggs Or Egg-Derived Products Rash  . Epinephrine Rash  . Erythromycin Rash  . Sulfa Antibiotics Rash  . Tetracycline Rash     Social History   Occupational History  . Occupation: Investment banker, corporate (preschool)   Tobacco Use  . Smoking status: Never Smoker  . Smokeless tobacco: Never Used  Substance and Sexual Activity  . Alcohol use: No  . Drug use: No  . Sexual activity: Not on file     Family History  Problem Relation Age of Onset  . Allergies Sister        x3  . Asthma Sister   . Bone cancer Father   . Lung cancer Father   . Stomach cancer Father   . Esophageal cancer Father   . Breast cancer Mother   . Cancer Mother   . Cancer Brother   . Hypertension Brother   . Prostate cancer Brother   . Cancer Brother        bone cancer  . Diabetes Brother   . Heart disease Sister   . Diabetes Sister   . Hypertension Sister   . Breast cancer Sister         mastectomy  . Stroke Sister   . Heart disease Other        2 siblings  . Colon cancer  Neg Hx   . Rectal cancer Neg Hx   . Liver cancer Neg Hx      Immunization History  Administered Date(s) Administered  . Pneumococcal Conjugate-13 04/11/2014  . Pneumococcal Polysaccharide-23 04/08/2017     Review of systems: Positive Findings in bold print. Constitutional:  chills, fatigue, fever, sweats, weight change Communication: Optometrist, sign Ecologist, hand writing, iPad/Android device Head: headaches, head injury Eyes: changes in vision, eye pain, glaucoma, cataracts, macular degeneration, diplopia, glare,  light sensitivity, eyeglasses or contacts, blindness Ears nose mouth throat: hearing impaired, hearing aids,  ringing in ears, deaf, sign language,  vertigo,   nosebleeds,  rhinitis,  cold sores, snoring, swollen glands Cardiovascular: HTN, edema, arrhythmia, pacemaker in place, defibrillator in place, chest pain/tightness, chronic anticoagulation, blood clot, heart failure, MI Peripheral Vascular: leg cramps, varicose veins, blood clots, lymphedema, varicosities Respiratory:  Asthma, difficulty breathing, denies congestion, SOB, wheezing, cough, emphysema Gastrointestinal: change in appetite or weight, abdominal pain, constipation, diarrhea, nausea, vomiting, vomiting blood, change in bowel habits, abdominal pain, jaundice, rectal bleeding, hemorrhoids, GERD Genitourinary:  nocturia,  pain on urination, polyuria,  blood in urine, Foley catheter, urinary urgency, ESRD on hemodialysis Musculoskeletal: amputation, cramping, stiff joints, painful joints, decreased joint motion, fractures, OA, gout, hemiplegia, paraplegia, uses cane, wheelchair bound, uses walker, uses rollator Skin: +changes in toenails, color change, dryness, itching, mole changes,  rash, wound(s) Neurological: headaches, numbness in feet, paresthesias in feet, burning in feet, fainting,  seizures, change in  speech. denies headaches, memory problems/poor historian, cerebral palsy, weakness, paralysis, CVA, TIA Endocrine: diabetes, hypothyroidism, hyperthyroidism,  goiter, dry mouth, flushing, heat intolerance,  cold intolerance,  excessive thirst, denies polyuria,  nocturia Hematological:  easy bleeding, excessive bleeding, easy bruising, enlarged lymph nodes, on long term blood thinner, history of past transusions Allergy/immunological:  hives, eczema, frequent infections, multiple drug allergies, seasonal allergies, transplant recipient, multiple food allergies Psychiatric:  anxiety, depression, mood disorder, suicidal ideations, hallucinations, insomnia  Objective: Vitals:   08/01/19 0847  BP: (!) 171/97  Pulse: 71    Vascular Examination: Capillary refill time immediate x 10 digits.  Dorsalis pedis pulses palpable b/l.   Posterior tibial pulses palpable b/l.   Digital hair sparse b/l.  Skin temperature gradient WNL b/l.  Dermatological Examination: Skin with normal turgor, texture and tone b/l.  Toenails 1-5 b/l discolored, thick, dystrophic with subungual debris and pain with palpation to nailbeds due to thickness of nails. Right hallux with thick, mycotic nailplate which exhibits onycholysis distal 1/3 of nailplate with tenderness to palpation. No edema, no erythema, no subungual abscess/hematoma/seroma.   Porokeratotic lesions submet head 2 left foot with tenderness to palpation. No erythema, no edema, no drainage, no flocculence. Hyperkeratotic lesion submet head 2 right foot and submet head 5 b/l with tenderness to palpation. No edema, no erythema, no drainage, no flocculence.  Musculoskeletal: Muscle strength 5/5 to all LE muscle groups b/l.  Neurological: Sensation intact 5/5 b/l with 10 gram monofilament.  Vibratory sensation intact.  Assessment: 1. Painful onychomycosis toenails 1-5 b/l  2. Porokeratosis submet head 2 left foot 3. Calluses submet head 2 right foot  and submet head 5 b/l 4. Prediabetes  Plan: 1. Toenails 1-5 left, 2-5 b/l were debrided in length and girth without iatrogenic bleeding. Right hallux nailplate debrided to level of adherence to nailbed. Borders curretaged. No further treatment required. 2. Porokeratosis submet head 2 left foot pared and enucleated with sterile scalpel blade without incident. Calluses pared submetatarsal head 2 right foot and submet head  5 b/l utilizing sterile scalpel blade without incident. 3. Patient to continue soft, supportive shoe gear daily. 4. Patient to report any pedal injuries to medical professional immediately. 5. Follow up 3 months.  6. Patient/POA to call should there be a concern in the interim.

## 2019-08-01 NOTE — Patient Instructions (Addendum)

## 2019-08-16 ENCOUNTER — Ambulatory Visit: Payer: Managed Care, Other (non HMO) | Admitting: Gastroenterology

## 2019-08-19 ENCOUNTER — Encounter: Payer: Self-pay | Admitting: Nurse Practitioner

## 2019-08-30 ENCOUNTER — Ambulatory Visit: Payer: Managed Care, Other (non HMO) | Admitting: Gastroenterology

## 2019-08-30 ENCOUNTER — Encounter: Payer: Self-pay | Admitting: Gastroenterology

## 2019-08-30 VITALS — BP 110/80 | HR 68 | Temp 97.6°F | Ht 62.0 in | Wt 188.0 lb

## 2019-08-30 DIAGNOSIS — Z86010 Personal history of colonic polyps: Secondary | ICD-10-CM

## 2019-08-30 DIAGNOSIS — R194 Change in bowel habit: Secondary | ICD-10-CM | POA: Diagnosis not present

## 2019-08-30 DIAGNOSIS — K6289 Other specified diseases of anus and rectum: Secondary | ICD-10-CM

## 2019-08-30 DIAGNOSIS — K5900 Constipation, unspecified: Secondary | ICD-10-CM

## 2019-08-30 DIAGNOSIS — Z1159 Encounter for screening for other viral diseases: Secondary | ICD-10-CM

## 2019-08-30 MED ORDER — NA SULFATE-K SULFATE-MG SULF 17.5-3.13-1.6 GM/177ML PO SOLN: 1.0000 | Freq: Once | ORAL | 0 refills | Status: AC

## 2019-08-30 NOTE — Patient Instructions (Signed)
Please take Senna every day leading up to your Colonoscopy.  You have been scheduled for a colonoscopy. Please follow written instructions given to you at your visit today.  Please pick up your prep supplies at the pharmacy within the next 1-3 days. If you use inhalers (even only as needed), please bring them with you on the day of your procedure. Your physician has requested that you go to www.startemmi.com and enter the access code given to you at your visit today. This web site gives a general overview about your procedure. However, you should still follow specific instructions given to you by our office regarding your preparation for the procedure.  Thank you for choosing me and [facility].  Pricilla Riffle. Dagoberto Ligas., MD., Marval Regal

## 2019-08-30 NOTE — Progress Notes (Signed)
    History of Present Illness: This is a 68 year old female with a change in bowel habits, constipation, rectal pain, LLQ pain. All symptoms appear to be associated with constipation and bowel habit change. Takes Senna intermittently. Miralax was not effective. Denies weight loss, diarrhea, change in stool caliber, melena, hematochezia, nausea, vomiting, dysphagia, reflux symptoms, chest pain.  Current Medications, Allergies, Past Medical History, Past Surgical History, Family History and Social History were reviewed in [employer] record.   Physical Exam: General: Well developed, well nourished, no acute distress Head: Normocephalic and atraumatic Eyes:  sclerae anicteric, EOMI Ears: Normal auditory acuity Mouth: No deformity or lesions Lungs: Clear throughout to auscultation Heart: Regular rate and rhythm; no murmurs, rubs or bruits Abdomen: Soft, mild LLQ tenderness and non distended. No masses, hepatosplenomegaly or hernias noted. Normal Bowel sounds Rectal: Deferred to colonoscopy Musculoskeletal: Symmetrical with no gross deformities  Pulses:  Normal pulses noted Extremities: No clubbing, cyanosis, edema or deformities noted Neurological: Alert oriented x 4, grossly nonfocal Psychological:  Alert and cooperative. Normal mood and affect   Assessment and Recommendations:  1. Change in bowel habits, constipation, rectal pain, LLQ pain, personal history of adenomatous colon polyps. R/O colorectal lesions. Senna qd to qod, not prn. Increase daily fluid intake. Schedule colonoscopy. The risks (including bleeding, perforation, infection, missed lesions, medication reactions and possible hospitalization or surgery if complications occur), benefits, and alternatives to colonoscopy with possible biopsy and possible polypectomy were discussed with the patient and they consent to proceed.

## 2019-09-01 ENCOUNTER — Encounter: Payer: Self-pay | Admitting: Gastroenterology

## 2019-09-02 ENCOUNTER — Ambulatory Visit (INDEPENDENT_AMBULATORY_CARE_PROVIDER_SITE_OTHER): Payer: Managed Care, Other (non HMO)

## 2019-09-02 DIAGNOSIS — Z1159 Encounter for screening for other viral diseases: Secondary | ICD-10-CM

## 2019-09-05 LAB — SARS CORONAVIRUS 2 (TAT 6-24 HRS): SARS Coronavirus 2: NEGATIVE

## 2019-09-06 ENCOUNTER — Other Ambulatory Visit: Payer: Self-pay

## 2019-09-06 ENCOUNTER — Ambulatory Visit (AMBULATORY_SURGERY_CENTER): Payer: Managed Care, Other (non HMO) | Admitting: Gastroenterology

## 2019-09-06 ENCOUNTER — Other Ambulatory Visit: Payer: Self-pay | Admitting: Gastroenterology

## 2019-09-06 ENCOUNTER — Encounter: Payer: Self-pay | Admitting: Gastroenterology

## 2019-09-06 VITALS — BP 159/83 | HR 69 | Temp 97.9°F | Resp 19 | Ht 62.0 in | Wt 188.0 lb

## 2019-09-06 DIAGNOSIS — K640 First degree hemorrhoids: Secondary | ICD-10-CM | POA: Diagnosis not present

## 2019-09-06 DIAGNOSIS — R194 Change in bowel habit: Secondary | ICD-10-CM

## 2019-09-06 DIAGNOSIS — D122 Benign neoplasm of ascending colon: Secondary | ICD-10-CM | POA: Diagnosis not present

## 2019-09-06 DIAGNOSIS — K5900 Constipation, unspecified: Secondary | ICD-10-CM | POA: Diagnosis present

## 2019-09-06 DIAGNOSIS — K5730 Diverticulosis of large intestine without perforation or abscess without bleeding: Secondary | ICD-10-CM | POA: Diagnosis not present

## 2019-09-06 DIAGNOSIS — D124 Benign neoplasm of descending colon: Secondary | ICD-10-CM | POA: Diagnosis not present

## 2019-09-06 DIAGNOSIS — Z86010 Personal history of colonic polyps: Secondary | ICD-10-CM

## 2019-09-06 MED ORDER — SODIUM CHLORIDE 0.9 % IV SOLN: 500.0000 mL | Freq: Once | INTRAVENOUS | Status: DC

## 2019-09-06 NOTE — Progress Notes (Signed)
Called to room to assist during endoscopic procedure.  Patient ID and intended procedure confirmed with present staff. Received instructions for my participation in the procedure from the performing physician.  

## 2019-09-06 NOTE — Progress Notes (Signed)
Pt's states no medical or surgical changes since previsit or office visit.  JB temps, NS IV and CW vitals.

## 2019-09-06 NOTE — Op Note (Signed)
[facility] Patient Name: Mary Collier Procedure Date: 09/06/2019 1:31 PM MRN: [id number] Endoscopist: Ladene Artist , MD Age: 68 Referring MD:  Date of Birth: 1951/08/14 Gender: Female Account #: [id number] Procedure:                Colonoscopy Indications:              Change in bowel habits, Constipation, Personal                            history of adenomatous colon polyps. Medicines:                Monitored Anesthesia Care Procedure:                Pre-Anesthesia Assessment:                           - Prior to the procedure, a History and Physical                            was performed, and patient medications and                            allergies were reviewed. The patient's tolerance of                            previous anesthesia was also reviewed. The risks                            and benefits of the procedure and the sedation                            options and risks were discussed with the patient.                            All questions were answered, and informed consent                            was obtained. Prior Anticoagulants: The patient has                            taken no previous anticoagulant or antiplatelet                            agents. ASA Grade Assessment: II - A patient with                            mild systemic disease. After reviewing the risks                            and benefits, the patient was deemed in                            satisfactory condition to undergo the procedure.  After obtaining informed consent, the colonoscope                            was passed under direct vision. Throughout the                            procedure, the patient's blood pressure, pulse, and                            oxygen saturations were monitored continuously. The                            Colonoscope was introduced through the anus and                            advanced to the the cecum,  identified by                            appendiceal orifice and ileocecal valve. The                            ileocecal valve, appendiceal orifice, and rectum                            were photographed. The quality of the bowel                            preparation was good. The colonoscopy was performed                            without difficulty. The patient tolerated the                            procedure well. Scope In: 1:35:38 PM Scope Out: 1:58:54 PM Scope Withdrawal Time: 0 hours 20 minutes 18 seconds  Total Procedure Duration: 0 hours 23 minutes 16 seconds  Findings:                 The perianal and digital rectal examinations were                            normal.                           Four sessile polyps were found in the descending                            colon (3) and ascending colon (1). The polyps were                            6 to 7 mm in size. These polyps were removed with a                            cold snare. Resection and retrieval were complete.  Multiple small-mouthed diverticula were found in                            the left colon. There was no evidence of                            diverticular bleeding.                           Internal hemorrhoids were found during                            retroflexion. The hemorrhoids were small and Grade                            I (internal hemorrhoids that do not prolapse).                           The exam was otherwise without abnormality on                            direct and retroflexion views. Complications:            No immediate complications. Estimated blood loss:                            None. Estimated Blood Loss:     Estimated blood loss: none. Impression:               - Four 6 to 7 mm polyps in the descending colon and                            in the ascending colon, removed with a cold snare.                            Resected and retrieved.                            - Mild diverticulosis in the left colon.                           - Internal hemorrhoids.                           - The examination was otherwise normal on direct                            and retroflexion views. Recommendation:           - Repeat colonoscopy after studies are complete for                            surveillance based on pathology results.                           - Patient has a contact number available for  emergencies. The signs and symptoms of potential                            delayed complications were discussed with the                            patient. Return to normal activities tomorrow.                            Written discharge instructions were provided to the                            patient.                           - High fiber diet.                           - Continue present medications.                           - Await pathology results. Ladene Artist, MD 09/06/2019 2:02:49 PM This report has been signed electronically.

## 2019-09-06 NOTE — Patient Instructions (Addendum)
Thank you for allowing [place] to care for you today!  Await pathology result by mail, approximately 1-2 weeks.  Will make recommendation at that time for your next colonoscopy.  Resume previous diet and mediations today.  Recommend eating more high fiber foods, handout provided.  Resume previous medications as before.  Return to your normal activities tomorrow.  Handouts given: high fiber, polyps, and diverticuloses.   YOU HAD AN ENDOSCOPIC PROCEDURE TODAY AT [facility]:   Refer to the procedure report that was given to you for any specific questions about what was found during the examination.  If the procedure report does not answer your questions, please call your gastroenterologist to clarify.  If you requested that your care partner not be given the details of your procedure findings, then the procedure report has been included in a sealed envelope for you to review at your convenience later.  YOU SHOULD EXPECT: Some feelings of bloating in the abdomen. Passage of more gas than usual.  Walking can help get rid of the air that was put into your GI tract during the procedure and reduce the bloating. If you had a lower endoscopy (such as a colonoscopy or flexible sigmoidoscopy) you may notice spotting of blood in your stool or on the toilet paper. If you underwent a bowel prep for your procedure, you may not have a normal bowel movement for a few days.  Please Note:  You might notice some irritation and congestion in your nose or some drainage.  This is from the oxygen used during your procedure.  There is no need for concern and it should clear up in a day or so.  SYMPTOMS TO REPORT IMMEDIATELY:   Following lower endoscopy (colonoscopy or flexible sigmoidoscopy):  Excessive amounts of blood in the stool  Significant tenderness or worsening of abdominal pains  Swelling of the abdomen that is new, acute  Fever of 100F or higher   For urgent or emergent issues, a  gastroenterologist can be reached at any hour by calling [phone].   DIET:  We do recommend a small meal at first, but then you may proceed to your regular diet.  Drink plenty of fluids but you should avoid alcoholic beverages for 24 hours.  ACTIVITY:  You should plan to take it easy for the rest of today and you should NOT DRIVE or use heavy machinery until tomorrow (because of the sedation medicines used during the test).    FOLLOW UP: Our staff will call the number listed on your records 48-72 hours following your procedure to check on you and address any questions or concerns that you may have regarding the information given to you following your procedure. If we do not reach you, we will leave a message.  We will attempt to reach you two times.  During this call, we will ask if you have developed any symptoms of COVID 19. If you develop any symptoms (ie: fever, flu-like symptoms, shortness of breath, cough etc.) before then, please call [phone].  If you test positive for Covid 19 in the 2 weeks post procedure, please call and report this information to [place].    If any biopsies were taken you will be contacted by phone or by letter within the next 1-3 weeks.  Please call us at [phone] if you have not heard about the biopsies in 3 weeks.    SIGNATURES/CONFIDENTIALITY: You and/or your care partner have signed paperwork which will be entered into your  electronic medical record.  These signatures attest to the fact that that the information above on your After Visit Summary has been reviewed and is understood.  Full responsibility of the confidentiality of this discharge information lies with you and/or your care-partner.

## 2019-09-06 NOTE — Progress Notes (Signed)
Report given to PACU, vss 

## 2019-09-08 ENCOUNTER — Telehealth: Payer: Self-pay

## 2019-09-08 NOTE — Telephone Encounter (Signed)
  Follow up Call-  Call back number 09/06/2019 05/04/2017  Post procedure Call Back phone  # [phone]  Permission to leave phone message Yes Yes  Some recent data might be hidden     Patient questions:  Do you have a fever, pain , or abdominal swelling? Yes.   Pain Score  10 *  Have you tolerated food without any problems? Yes.    Have you been able to return to your normal activities? Yes.    Do you have any questions about your discharge instructions: Diet   No. Medications  No. Follow up visit  No.  Do you have questions or concerns about your Care? Yes.    Actions: * If pain score is 4 or above: Physician/ provider Notified : Lucio Edward, MD  1. Have you developed a fever since your procedure? no  2.   Have you had an respiratory symptoms (SOB or cough) since your procedure? no  3.   Have you tested positive for COVID 19 since your procedure no  4.   Have you had any family members/close contacts diagnosed with the COVID 19 since your procedure?  no   If yes to any of these questions please route to Joylene John, RN and Alphonsa Gin, Therapist, sports.  Marland Kitchen

## 2019-09-08 NOTE — Telephone Encounter (Signed)
First post procedure follow up call, left message. °

## 2019-09-08 NOTE — Telephone Encounter (Signed)
Hydrocortisone supp PR bid for 10 days then PR bid prn  RectiCare cream (OTC) PR tid for 10 days and then tid prn pain Sitz bath tid prn pain Laxatives as needed for constipation Avoid straining with bowel movements

## 2019-09-13 ENCOUNTER — Other Ambulatory Visit: Payer: Self-pay

## 2019-09-13 ENCOUNTER — Encounter: Payer: Self-pay | Admitting: Nurse Practitioner

## 2019-09-13 ENCOUNTER — Ambulatory Visit (INDEPENDENT_AMBULATORY_CARE_PROVIDER_SITE_OTHER): Payer: Managed Care, Other (non HMO) | Admitting: Nurse Practitioner

## 2019-09-13 VITALS — BP 136/82 | HR 65 | Temp 98.0°F | Ht 62.0 in | Wt 184.4 lb

## 2019-09-13 DIAGNOSIS — R739 Hyperglycemia, unspecified: Secondary | ICD-10-CM | POA: Diagnosis not present

## 2019-09-13 DIAGNOSIS — I10 Essential (primary) hypertension: Secondary | ICD-10-CM

## 2019-09-13 DIAGNOSIS — M791 Myalgia, unspecified site: Secondary | ICD-10-CM

## 2019-09-13 DIAGNOSIS — E782 Mixed hyperlipidemia: Secondary | ICD-10-CM | POA: Diagnosis not present

## 2019-09-13 LAB — SEDIMENTATION RATE: Sed Rate: 31 mm/hr — ABNORMAL HIGH (ref 0–30)

## 2019-09-13 LAB — HEMOGLOBIN A1C: Hgb A1c MFr Bld: 6.3 % (ref 4.6–6.5)

## 2019-09-13 LAB — CK: Total CK: 166 U/L (ref 7–177)

## 2019-09-13 MED ORDER — LOSARTAN POTASSIUM 25 MG PO TABS: 25.0000 mg | ORAL_TABLET | Freq: Every day | ORAL | 1 refills | Status: DC

## 2019-09-13 NOTE — Patient Instructions (Addendum)
Hold atorvastatin for 1week. If muscle pain improves, let me know, If no improvement, let me know and resume atovastatin.  Go to lab for blood draw Maintain other medications  Please sign up for mychart account.

## 2019-09-13 NOTE — Progress Notes (Signed)
Subjective:  Patient ID: Mary Collier, female    DOB: 08-Oct-1951  Age: 68 y.o. MRN: [id number]  CC: Follow-up ( and follow up on BP-fasting-pt stated home BP running 110/74 and 138/80/MM scheudle 10/12/19 at GYN)  HPI HTN: Stable bP with losartan, and metoprolol. BP Readings from Last 3 Encounters:  09/13/19 136/82  09/06/19 (!) 159/83  08/30/19 110/80   She reports worsen muscle and joint pain in last 97months. Worse with prolonged inactivity. Denies any joint swelling or redness or fever or paresthesia or focal weakness.  Reviewed past Medical, Social and Family history today.  Outpatient Medications Prior to Visit  Medication Sig Dispense Refill  . acetaminophen (TYLENOL) 325 MG tablet Take 325 mg by mouth as needed.      Marland Kitchen atorvastatin (LIPITOR) 10 MG tablet Take 1 tablet (10 mg total) by mouth daily. 90 tablet 3  . bimatoprost (LUMIGAN) 0.03 % ophthalmic solution Place 1 drop into both eyes at bedtime.      . calcium-vitamin D (OSCAL 500/200 D-3) 500-200 MG-UNIT per tablet Take 1 tablet by mouth 2 (two) times daily. 60 tablet 12  . CVS E 200 units capsule Take 200 Units by mouth daily.  0  . CVS SENNA PLUS 8.6-50 MG tablet TAKE 1 TABLET BY MOUTH DAILY. 90 tablet 1  . diclofenac sodium (VOLTAREN) 1 % GEL APPLY 2 G TOPICALLY 3 (THREE) TIMES DAILY AS NEEDED. 100 g 0  . dimenhyDRINATE (DRAMAMINE) 50 MG tablet Take 50 mg by mouth every 8 (eight) hours as needed for dizziness.    . fluticasone (FLONASE) 50 MCG/ACT nasal spray SPRAY 2 SPRAYS INTO EACH NOSTRIL EVERY DAY 48 mL 0  . folic acid (FOLVITE) 1 MG tablet Take 1 mg by mouth daily.    . hydrocortisone (ANUSOL-HC) 25 MG suppository PLACE 1 SUPPOSITORY PER RECTUM TWICE A DAY 28 suppository 2  . ipratropium (ATROVENT) 0.03 % nasal spray     . metoprolol succinate (TOPROL-XL) 25 MG 24 hr tablet Take 1 tablet (25 mg total) by mouth daily. 90 tablet 3  . omeprazole (PRILOSEC) 40 MG capsule Take 1 capsule (40 mg total) by mouth  daily. 90 capsule 0  . ondansetron (ZOFRAN) 4 MG tablet Take 1 tablet (4 mg total) by mouth every 8 (eight) hours as needed for nausea or vomiting. 30 tablet 1  . OVER THE COUNTER MEDICATION Recicare cream--otc--TID for 10 days--then PRN--per GI doctor    . triamcinolone (NASACORT ALLERGY 24HR) 55 MCG/ACT AERO nasal inhaler Place 2 sprays into the nose as needed.    Marland Kitchen losartan (COZAAR) 25 MG tablet Take 1 tablet (25 mg total) by mouth daily. 90 tablet 1   No facility-administered medications prior to visit.     ROS See HPI  Objective:  BP 136/82   Pulse 65   Temp 98 F (36.7 C) (Oral)   Ht 5\' 2"  (1.575 m)   Wt 184 lb 6.4 oz (83.6 kg)   SpO2 97%   BMI 33.73 kg/m   BP Readings from Last 3 Encounters:  09/13/19 136/82  09/06/19 (!) 159/83  08/30/19 110/80    Wt Readings from Last 3 Encounters:  09/13/19 184 lb 6.4 oz (83.6 kg)  09/06/19 188 lb (85.3 kg)  08/30/19 188 lb (85.3 kg)    Physical Exam Constitutional:      Appearance: She is obese.  Cardiovascular:     Rate and Rhythm: Normal rate and regular rhythm.     Pulses: Normal pulses.  Heart sounds: Normal heart sounds.  Pulmonary:     Effort: Pulmonary effort is normal.     Breath sounds: Normal breath sounds.  Musculoskeletal:     Right lower leg: No edema.     Left lower leg: No edema.  Neurological:     Mental Status: She is alert and oriented to person, place, and time.     Lab Results  Component Value Date   WBC 11.1 (H) 02/03/2019   HGB 12.5 02/03/2019   HCT 38.0 02/03/2019   PLT 229.0 02/03/2019   GLUCOSE 89 06/14/2019   CHOL 139 12/07/2018   TRIG 104.0 12/07/2018   HDL 43.00 12/07/2018   LDLCALC 76 12/07/2018   ALT 6 06/14/2019   AST 15 06/14/2019   NA 138 06/14/2019   K 3.9 06/14/2019   CL 105 06/14/2019   CREATININE 0.77 06/14/2019   BUN 11 06/14/2019   CO2 25 06/14/2019   TSH 0.98 06/14/2019   HGBA1C 6.3 09/13/2019    Dg Bone Density  Result Date: 07/17/2019 Date of study:  07/14/19 Exam: DUAL X-RAY ABSORPTIOMETRY (DXA) FOR BONE MINERAL DENSITY (BMD) Instrument: [employer] Provider: PCP Indication: screening for osteoporosis Comparison: none (please note that it is not possible to compare data from different instruments) Clinical data: Pt is a 68 y.o. female with previous fractures. Results:  Lumbar spine L1-L4 Femoral neck (FN) 33% distal radius T-score -0.3 RFN: -0.4 LFN: -0.3 n/a Change in BMD from previous DXA test (%) n/a n/a n/a (*) statistically significant Assessment: the BMD is normal according to the [facility] classification for osteoporosis (see below). Fracture risk: low FRAX score: not calculated due to normal BMD Comments: the technical quality of the study is good Recommend optimizing calcium (1200 mg/day) and vitamin D (800 IU/day) intake. No pharmacological treatment is indicated. Followup: Repeat BMD is appropriate after 2 years. WHO criteria for diagnosis of osteoporosis in postmenopausal women and in men 4 y/o or older: - normal: T-score -1.0 to + 1.0 - osteopenia/low bone density: T-score between -2.5 and -1.0 - osteoporosis: T-score below -2.5 - severe osteoporosis: T-score below -2.5 with history of fragility fracture Note: although not part of the WHO classification, the presence of a fragility fracture, regardless of the T-score, should be considered diagnostic of osteoporosis, provided other causes for the fracture have been excluded. Loura Pardon MD    Assessment & Plan:   Zahirah was seen today for follow-up.  Diagnoses and all orders for this visit:  Mixed hyperlipidemia -     Cancel: CK (Creatine Kinase) -     Sedimentation rate -     Hemoglobin A1c -     CK (Creatine Kinase)  Essential hypertension -     losartan (COZAAR) 25 MG tablet; Take 1 tablet (25 mg total) by mouth daily. -     Cancel: CK (Creatine Kinase) -     Sedimentation rate -     Hemoglobin A1c -     CK (Creatine Kinase)  Hyperglycemia -     Cancel:  Hemoglobin A1c -     Sedimentation rate -     Hemoglobin A1c -     CK (Creatine Kinase)  Myalgia -     Cancel: CK (Creatine Kinase) -     Cancel: Sedimentation rate -     Sedimentation rate -     Hemoglobin A1c -     CK (Creatine Kinase)   I am having Blanch Media A. Styron maintain her bimatoprost, acetaminophen,  folic acid, calcium-vitamin D, CVS Senna Plus, CVS E, dimenhyDRINATE, triamcinolone, atorvastatin, metoprolol succinate, hydrocortisone, ondansetron, diclofenac sodium, omeprazole, fluticasone, ipratropium, OVER THE COUNTER MEDICATION, and losartan.  Meds ordered this encounter  Medications  . losartan (COZAAR) 25 MG tablet    Sig: Take 1 tablet (25 mg total) by mouth daily.    Dispense:  90 tablet    Refill:  1    Discontinue lisinopril    Order Specific Question:   Supervising Provider    Answer:   Lucille Passy [[id number]]    Problem List Items Addressed This Visit      Cardiovascular and Mediastinum   HTN (hypertension)   Relevant Medications   losartan (COZAAR) 25 MG tablet   Other Relevant Orders   Sedimentation rate (Completed)   Hemoglobin A1c (Completed)   CK (Creatine Kinase) (Completed)     Other   Hyperglycemia   Relevant Orders   Sedimentation rate (Completed)   Hemoglobin A1c (Completed)   CK (Creatine Kinase) (Completed)   Hyperlipidemia - Primary   Relevant Medications   losartan (COZAAR) 25 MG tablet   Other Relevant Orders   Sedimentation rate (Completed)   Hemoglobin A1c (Completed)   CK (Creatine Kinase) (Completed)    Other Visit Diagnoses    Myalgia       Relevant Orders   Sedimentation rate (Completed)   Hemoglobin A1c (Completed)   CK (Creatine Kinase) (Completed)       Follow-up: Return in about 3 months (around 12/14/2019) for HTN and DM (fasting, video).  Wilfred Lacy, NP

## 2019-09-14 ENCOUNTER — Ambulatory Visit: Payer: Managed Care, Other (non HMO) | Admitting: Nurse Practitioner

## 2019-09-14 ENCOUNTER — Encounter: Payer: Self-pay | Admitting: Nurse Practitioner

## 2019-09-20 ENCOUNTER — Encounter: Payer: Self-pay | Admitting: Gastroenterology

## 2019-10-11 ENCOUNTER — Telehealth: Payer: Self-pay | Admitting: Nurse Practitioner

## 2019-10-11 DIAGNOSIS — H5712 Ocular pain, left eye: Secondary | ICD-10-CM

## 2019-10-11 NOTE — Telephone Encounter (Signed)
Mary Collier was seen by Dr. Marshall Cork with [facility] on 10/07/19 for acute left eye pain. GCA suspected, so CBC, ESR and CRP requested. Orders entered. Please contact patient and schedule lab appt.

## 2019-10-11 NOTE — Telephone Encounter (Signed)
Pt has an appt with lab 10/12/2019.

## 2019-10-12 ENCOUNTER — Other Ambulatory Visit: Payer: Self-pay

## 2019-10-12 ENCOUNTER — Telehealth: Payer: Self-pay | Admitting: Nurse Practitioner

## 2019-10-12 ENCOUNTER — Other Ambulatory Visit (INDEPENDENT_AMBULATORY_CARE_PROVIDER_SITE_OTHER): Payer: Managed Care, Other (non HMO)

## 2019-10-12 DIAGNOSIS — H5712 Ocular pain, left eye: Secondary | ICD-10-CM | POA: Diagnosis not present

## 2019-10-12 LAB — SEDIMENTATION RATE: Sed Rate: 31 mm/hr — ABNORMAL HIGH (ref 0–30)

## 2019-10-12 LAB — CBC WITH DIFFERENTIAL/PLATELET
Basophils Absolute: 0 10*3/uL (ref 0.0–0.1)
Basophils Relative: 0.5 % (ref 0.0–3.0)
Eosinophils Absolute: 0.1 10*3/uL (ref 0.0–0.7)
Eosinophils Relative: 1.2 % (ref 0.0–5.0)
HCT: 37 % (ref 36.0–46.0)
Hemoglobin: 12.1 g/dL (ref 12.0–15.0)
Lymphocytes Relative: 21.6 % (ref 12.0–46.0)
Lymphs Abs: 1.7 10*3/uL (ref 0.7–4.0)
MCHC: 32.7 g/dL (ref 30.0–36.0)
MCV: 89.3 fl (ref 78.0–100.0)
Monocytes Absolute: 0.8 10*3/uL (ref 0.1–1.0)
Monocytes Relative: 10.6 % (ref 3.0–12.0)
Neutro Abs: 5.2 10*3/uL (ref 1.4–7.7)
Neutrophils Relative %: 66.1 % (ref 43.0–77.0)
Platelets: 213 10*3/uL (ref 150.0–400.0)
RBC: 4.15 Mil/uL (ref 3.87–5.11)
RDW: 13.7 % (ref 11.5–15.5)
WBC: 7.8 10*3/uL (ref 4.0–10.5)

## 2019-10-12 LAB — C-REACTIVE PROTEIN: CRP: <1 mg/dL (ref 0.5–20.0)

## 2019-10-12 NOTE — Telephone Encounter (Signed)
Copy of lab result faxed to Eye doctor.    Copied from [place] [phone]. Topic: General - Other >> Oct 12, 2019  1:09 PM Rainey Pines A wrote: [facility] called and is requesting patients most recent lab results be faxed to [facility]. Best contact [phone]

## 2019-10-13 ENCOUNTER — Encounter: Payer: Self-pay | Admitting: Nurse Practitioner

## 2019-10-31 ENCOUNTER — Encounter: Payer: Self-pay | Admitting: Neurology

## 2019-10-31 DIAGNOSIS — H401132 Primary open-angle glaucoma, bilateral, moderate stage: Secondary | ICD-10-CM | POA: Diagnosis not present

## 2019-10-31 DIAGNOSIS — H5711 Ocular pain, right eye: Secondary | ICD-10-CM | POA: Diagnosis not present

## 2019-10-31 DIAGNOSIS — G500 Trigeminal neuralgia: Secondary | ICD-10-CM | POA: Diagnosis not present

## 2019-11-04 ENCOUNTER — Ambulatory Visit: Payer: Managed Care, Other (non HMO) | Admitting: Podiatry

## 2019-11-07 ENCOUNTER — Other Ambulatory Visit: Payer: Self-pay

## 2019-11-07 ENCOUNTER — Ambulatory Visit: Payer: Managed Care, Other (non HMO) | Admitting: Podiatry

## 2019-11-07 ENCOUNTER — Encounter: Payer: Self-pay | Admitting: Podiatry

## 2019-11-07 DIAGNOSIS — M79671 Pain in right foot: Secondary | ICD-10-CM | POA: Diagnosis not present

## 2019-11-07 DIAGNOSIS — B351 Tinea unguium: Secondary | ICD-10-CM

## 2019-11-07 DIAGNOSIS — L600 Ingrowing nail: Secondary | ICD-10-CM | POA: Diagnosis not present

## 2019-11-07 DIAGNOSIS — M79675 Pain in left toe(s): Secondary | ICD-10-CM

## 2019-11-07 DIAGNOSIS — Q828 Other specified congenital malformations of skin: Secondary | ICD-10-CM | POA: Diagnosis not present

## 2019-11-07 DIAGNOSIS — M79674 Pain in right toe(s): Secondary | ICD-10-CM | POA: Diagnosis not present

## 2019-11-07 DIAGNOSIS — M79672 Pain in left foot: Secondary | ICD-10-CM

## 2019-11-07 DIAGNOSIS — R7303 Prediabetes: Secondary | ICD-10-CM | POA: Diagnosis not present

## 2019-11-07 NOTE — Patient Instructions (Signed)
Onychomycosis/Fungal Toenails  WHAT IS IT? An infection that lies within the keratin of your nail plate that is caused by a fungus.  WHY ME? Fungal infections affect all ages, sexes, races, and creeds.  There may be many factors that predispose you to a fungal infection such as age, coexisting medical conditions such as diabetes, or an autoimmune disease; stress, medications, fatigue, genetics, etc.  Bottom line: fungus thrives in a warm, moist environment and your shoes offer such a location.  IS IT CONTAGIOUS? Theoretically, yes.  You do not want to share shoes, nail clippers or files with someone who has fungal toenails.  Walking around barefoot in the same room or sleeping in the same bed is unlikely to transfer the organism.  It is important to realize, however, that fungus can spread easily from one nail to the next on the same foot.  HOW DO WE TREAT THIS?  There are several ways to treat this condition.  Treatment may depend on many factors such as age, medications, pregnancy, liver and kidney conditions, etc.  It is best to ask your doctor which options are available to you.  No treatment.   Unlike many other medical concerns, you can live with this condition.  However for many people this can be a painful condition and may lead to ingrown toenails or a bacterial infection.  It is recommended that you keep the nails cut short to help reduce the amount of fungal nail. Topical treatment.  These range from herbal remedies to prescription strength nail lacquers.  About 40-50% effective, topicals require twice daily application for approximately 9 to 12 months or until an entirely new nail has grown out.  The most effective topicals are medical grade medications available through physicians offices. Oral antifungal medications.  With an 80-90% cure rate, the most common oral medication requires 3 to 4 months of therapy and stays in your system for a year as the new nail grows out.  Oral antifungal  medications do require blood work to make sure it is a safe drug for you.  A liver function panel will be performed prior to starting the medication and after the first month of treatment.  It is important to have the blood work performed to avoid any harmful side effects.  In general, this medication safe but blood work is required. Laser Therapy.  This treatment is performed by applying a specialized laser to the affected nail plate.  This therapy is noninvasive, fast, and non-painful.  It is not covered by insurance and is therefore, out of pocket.  The results have been very good with a 80-95% cure rate.  [facility] is the only practice in the area to offer this therapy. Permanent Nail Avulsion.  Removing the entire nail so that a new nail will not grow back.Corns and Calluses Corns are small areas of thickened skin that occur on the top, sides, or tip of a toe. They contain a cone-shaped core with a point that can press on a nerve below. This causes pain.  Calluses are areas of thickened skin that can occur anywhere on the body, including the hands, fingers, palms, soles of the feet, and heels. Calluses are usually larger than corns. What are the causes? Corns and calluses are caused by rubbing (friction) or pressure, such as from shoes that are too tight or do not fit properly. What increases the risk? Corns are more likely to develop in people who have misshapen toes (toe deformities), such  as hammer toes. Calluses can occur with friction to any area of the skin. They are more likely to develop in people who:  Work with their hands.  Wear shoes that fit poorly, are too tight, or are high-heeled.  Have toe deformities. What are the signs or symptoms? Symptoms of a corn or callus include:  A hard growth on the skin.  Pain or tenderness under the skin.  Redness and swelling.  Increased discomfort while wearing tight-fitting shoes, if your feet are affected. If a corn or  callus becomes infected, symptoms may include:  Redness and swelling that gets worse.  Pain.  Fluid, blood, or pus draining from the corn or callus. How is this diagnosed? Corns and calluses may be diagnosed based on your symptoms, your medical history, and a physical exam. How is this treated? Treatment for corns and calluses may include:  Removing the cause of the friction or pressure. This may involve: ? Changing your shoes. ? Wearing shoe inserts (orthotics) or other protective layers in your shoes, such as a corn pad. ? Wearing gloves.  Applying medicine to the skin (topical medicine) to help soften skin in the hardened, thickened areas.  Removing layers of dead skin with a file to reduce the size of the corn or callus.  Removing the corn or callus with a scalpel or laser.  Taking antibiotic medicines, if your corn or callus is infected.  Having surgery, if a toe deformity is the cause. Follow these instructions at home:   Take over-the-counter and prescription medicines only as told by your health care provider.  If you were prescribed an antibiotic, take it as told by your health care provider. Do not stop taking it even if your condition starts to improve.  Wear shoes that fit well. Avoid wearing high-heeled shoes and shoes that are too tight or too loose.  Wear any padding, protective layers, gloves, or orthotics as told by your health care provider.  Soak your hands or feet and then use a file or pumice stone to soften your corn or callus. Do this as told by your health care provider.  Check your corn or callus every day for symptoms of infection. Contact a health care provider if you:  Notice that your symptoms do not improve with treatment.  Have redness or swelling that gets worse.  Notice that your corn or callus becomes painful.  Have fluid, blood, or pus coming from your corn or callus.  Have new symptoms. Summary  Corns are small areas of  thickened skin that occur on the top, sides, or tip of a toe.  Calluses are areas of thickened skin that can occur anywhere on the body, including the hands, fingers, palms, and soles of the feet. Calluses are usually larger than corns.  Corns and calluses are caused by rubbing (friction) or pressure, such as from shoes that are too tight or do not fit properly.  Treatment may include wearing any padding, protective layers, gloves, or orthotics as told by your health care provider. This information is not intended to replace advice given to you by your health care provider. Make sure you discuss any questions you have with your health care provider. Document Revised: 01/26/2019 Document Reviewed: 08/19/2017 Elsevier Patient Education  2020 [employer].

## 2019-11-10 NOTE — Progress Notes (Signed)
Subjective: Mary Collier presents today for follow up of painful mycotic nails b/l that are difficult to trim. Pain interferes with ambulation. Aggravating factors include wearing enclosed shoe gear. Pain is relieved with periodic professional debridement..   Allergies  Allergen Reactions  . Influenza Vaccines     D/t being allergic to eggs  . Shrimp [Shellfish Allergy] Swelling    Lip, throat, tongue swelling  . Other Itching    Newspaper Dye  . Celecoxib     Able to take IBU  . Codeine Swelling  . Compazine [Prochlorperazine]   . Lisinopril Cough    cough  . Peanut-Containing Drug Products     Itching  . Prednisone     Rapid heart rate  . Prochlorperazine Edisylate     paralyzed  . Robinul [Glycopyrrolate]     boils  . Vioxx [Rofecoxib]     States able to take IBU  . Aspirin Rash    States able to take IBU  . Eggs Or Egg-Derived Products Rash  . Epinephrine Rash  . Erythromycin Rash  . Sulfa Antibiotics Rash  . Tetracycline Rash     Objective: There were no vitals filed for this visit.  Vascular Examination:  palpable DP pulse, palpable PT pulse, capillary refill time to digits immediate b/l, pedal hair sparse and skin temperature gradient within normal limits  Dermatological Examination: normal texture, turgor, and tone bilaterally, no open wounds bilaterally and no interdigital macerations bilaterally   onychomycosis of the toenails: discolored, thickened, subungual debris and pain to palpation of nail beds. Right great toe more symptomatic with incurvation of medial border today. No erythema, no edema, no drainage, no floccuence.  Porokeratotic lesions submet heads 2, 5 b/l with tenderness to palpation. No erythema, no edema, no drainage, no flocculence.  Musculoskeletal: normal muscle strength 5/5 to all lower extremity muscle groups bilaterally, no gross bony deformities bilaterally and no pain crepitus or joint limitation noted with  ROM  Neurological: sensation intact 5/5 intact bilaterally with 10g monofilament, vibratory sensation intact and proprioception intact bilaterally  Assessment: 1. Pain due to onychomycosis of toenails of both feet   2. Porokeratosis   3. Ingrown toenail without infection   4. Pain of toe of right foot   5. Pain in both feet   6. Prediabetes     Plan: -Toenails 1-5 b/l were debrided in length and girth without iatrogenic bleeding. -Patient to continue soft, supportive shoe gear daily. -Patient to report any pedal injuries to medical professional immediately. -Continue diabetic shoes daily -Patient to report any pedal injuries to medical professional immediately. -Patient/POA to call should there be question/concern in the interim. -Painful porokeratotic lesions pared and enucleated submet heads 2, 5 b/l with sterile scalpel blade  -Discussed treatment of chronic ingrown mycotic toenail. She is interested in having nail removed. She would like to have it done on her next visit. We discussed temporary vs permanent removal of partial/whole nail plate. She would like to have right great toe matrixectomy performed.  Follow up 3 months.

## 2019-11-13 NOTE — Progress Notes (Signed)
NEUROLOGY FOLLOW UP OFFICE NOTE  Mary Collier [id number]  HISTORY OF PRESENT ILLNESS: Mary Collier is a 69 year old female hypertension and GERD who follows up for right ocular pain.  She has remote history of headaches and generalized tonic clonic seizures as a teenager, which subsequently resolved.  Around 2010, she developed episodes of sharp right sided retro-orbital pain, 10/10, lasting 3 to 10 minutes.  There is associated conjunctival injection and watery eye but no ptosis or nasal congestion.  There is no associated symptoms such as visual disturbance (specific to these attacks), unilateral facial numbness/weakness, nausea, vomiting, dizziness or focal unilateral weakness or numbness of the body.  They initially occurred once a day but then became several times daily in 2018.  There are no triggers.  Laying down and applying warm compresses helps.  She takes Tylenol.  She was previously treated at the [facility] and was diagnosed with ocular migraines.  She also has sensation of water in back of the right occipital region.  She received occipital nerve blocks years ago, which was effective.    Around 2015, she developed gradual onset of vision loss and visual disturbance.  People and objects look like shadows and she sees double or triple.  When she reads, she sees several copies of the words stacked on top of each other.  She also sees floaters.  Occasionally, she sees a small yellow dot in the vision of her left eye that is intermittent, lasting just seconds.  She has been followed by [facility] for several years for glaucoma.  Due to persistent visual disturbance, she was evaluated by Dr. Tempie Hoist, a retinal specialist, on 10/23/16. On Humphrey visual field testing, she exhibited severe constriction bilaterally.  Retinal exam revealed grade II hypertensive retinopathy with minimal vitreous opacities.  It was suggested that her visual field defect may be  migraine-related phenomena.  I last saw her in 2018.  Sed rate and CRP from 01/01/2017 were 13 and 0.1 respectively.  MRI of brain with and without contrast on 01/21/2017 was personally reviewed and was unremarkable.  She saw neuro-ophthalmologist Dr. Jolyn Nap later in 2018, who was unable to make any definitive diagnosis.    She continues to have the persistent vision loss.  Most recent labs from 10/12/2019 showed sed rate 31 and CRP <1.0.  She followed up with her ophthalmologist, Dr. Quentin Ore, who suspected possible trigeminal neuralgia.  He was started on gabapentin 100mg  three times daily but has noted dizziness since then.     There is no family history of headaches or cerebral aneurysms.  PAST MEDICAL HISTORY: Past Medical History:  Diagnosis Date  . Allergic rhinitis   . Allergy   . Arthritis   . Asthma   . Blood transfusion without reported diagnosis    1966  . Cancer ([place])    at age 61.  abdominal  . Cancer of kidney ([place])    at age 16.  Right kidney.  . Chronic headache   . Edema   . GERD (gastroesophageal reflux disease)   . Glaucoma   . Hiatal hernia   . Hypertension   . IBS (irritable bowel syndrome)   . Internal hemorrhoid   . Lumbago   . Lump or mass in breast   . Lung cancer ([place])    at age 89  Right lung.  . Other abnormal blood chemistry   . Other and unspecified hyperlipidemia   . Other convulsions  hx seizures at age 60  . Peptic stricture of esophagus   . Reflux esophagitis   . Tubular adenoma of colon 2015  . Unspecified menopausal and postmenopausal disorder   . Unspecified vitamin D deficiency     MEDICATIONS: Current Outpatient Medications on File Prior to Visit  Medication Sig Dispense Refill  . acetaminophen (TYLENOL) 325 MG tablet Take 325 mg by mouth as needed.      Marland Kitchen atorvastatin (LIPITOR) 10 MG tablet Take 1 tablet (10 mg total) by mouth daily. 90 tablet 3  . bimatoprost (LUMIGAN) 0.03 % ophthalmic solution Place 1 drop into  both eyes at bedtime.      . calcium-vitamin D (OSCAL 500/200 D-3) 500-200 MG-UNIT per tablet Take 1 tablet by mouth 2 (two) times daily. 60 tablet 12  . CVS E 200 units capsule Take 200 Units by mouth daily.  0  . [employer]S SENNA PLUS 8.6-50 MG tablet TAKE 1 TABLET BY MOUTH DAILY. 90 tablet 1  . diclofenac sodium (VOLTAREN) 1 % GEL APPLY 2 G TOPICALLY 3 (THREE) TIMES DAILY AS NEEDED. 100 g 0  . dimenhyDRINATE (DRAMAMINE) 50 MG tablet Take 50 mg by mouth every 8 (eight) hours as needed for dizziness.    . fluticasone (FLONASE) 50 MCG/ACT nasal spray SPRAY 2 SPRAYS INTO EACH NOSTRIL EVERY DAY 48 mL 0  . folic acid (FOLVITE) 1 MG tablet Take 1 mg by mouth daily.    Marland Kitchen gabapentin (NEURONTIN) 100 MG capsule Take 100 mg by mouth 3 (three) times daily.    . hydrocortisone (ANUSOL-HC) 25 MG suppository PLACE 1 SUPPOSITORY PER RECTUM TWICE A DAY 28 suppository 2  . ipratropium (ATROVENT) 0.03 % nasal spray     . lisinopril (ZESTRIL) 5 MG tablet lisinopril 5 mg tablet    . losartan (COZAAR) 25 MG tablet Take 1 tablet (25 mg total) by mouth daily. 90 tablet 1  . LOTEMAX 0.5 % GEL Apply 1 drop to eye 4 (four) times daily.    Marland Kitchen LUMIGAN 0.01 % SOLN 1 drop at bedtime.    . metoprolol succinate (TOPROL-XL) 25 MG 24 hr tablet Take 1 tablet (25 mg total) by mouth daily. 90 tablet 3  . omeprazole (PRILOSEC) 40 MG capsule Take 1 capsule (40 mg total) by mouth daily. 90 capsule 0  . ondansetron (ZOFRAN) 4 MG tablet Take 1 tablet (4 mg total) by mouth every 8 (eight) hours as needed for nausea or vomiting. 30 tablet 1  . OVER THE COUNTER MEDICATION Recicare cream--otc--TID for 10 days--then PRN--per GI doctor    . timolol (TIMOPTIC) 0.5 % ophthalmic solution 1 drop every morning.    . triamcinolone (NASACORT ALLERGY 24HR) 55 MCG/ACT AERO nasal inhaler Place 2 sprays into the nose as needed.     No current facility-administered medications on file prior to visit.    ALLERGIES: Allergies  Allergen Reactions  .  Influenza Vaccines     D/t being allergic to eggs  . Shrimp [Shellfish Allergy] Swelling    Lip, throat, tongue swelling  . Other Itching    Newspaper Dye  . Celecoxib     Able to take IBU  . Codeine Swelling  . Compazine [Prochlorperazine]   . Lisinopril Cough    cough  . Peanut-Containing Drug Products     Itching  . Prednisone     Rapid heart rate  . Prochlorperazine Edisylate     paralyzed  . Robinul [Glycopyrrolate]     boils  . Vioxx [Rofecoxib]  States able to take IBU  . Aspirin Rash    States able to take IBU  . Eggs Or Egg-Derived Products Rash  . Epinephrine Rash  . Erythromycin Rash  . Sulfa Antibiotics Rash  . Tetracycline Rash    FAMILY HISTORY: Family History  Problem Relation Age of Onset  . Allergies Sister        x3  . Asthma Sister   . Bone cancer Father   . Lung cancer Father   . Stomach cancer Father   . Esophageal cancer Father   . Breast cancer Mother   . Prostate cancer Brother   . Pancreatic cancer Brother        mets from prostate  . Colon cancer Brother   . Hypertension Brother   . Prostate cancer Brother   . Bone cancer Brother   . Diabetes Brother   . Heart disease Sister   . Diabetes Sister   . Hypertension Sister   . Breast cancer Sister        mastectomy  . Stroke Sister   . Rectal cancer Neg Hx   . Liver cancer Neg Hx     SOCIAL HISTORY: Social History   Socioeconomic History  . Marital status: Married    Spouse name: Not on file  . Number of children: 0  . Years of education: Not on file  . Highest education level: Not on file  Occupational History  . Occupation: Investment banker, corporate (preschool)   Tobacco Use  . Smoking status: Never Smoker  . Smokeless tobacco: Never Used  Substance and Sexual Activity  . Alcohol use: No  . Drug use: No  . Sexual activity: Not on file  Other Topics Concern  . Not on file  Social History Narrative   Pt has 18 siblings.   No caffeine drinks    Social Determinants  of Health   Financial Resource Strain:   . Difficulty of Paying Living Expenses: Not on file  Food Insecurity:   . Worried About Charity fundraiser in the Last Year: Not on file  . Ran Out of Food in the Last Year: Not on file  Transportation Needs:   . Lack of Transportation (Medical): Not on file  . Lack of Transportation (Non-Medical): Not on file  Physical Activity:   . Days of Exercise per Week: Not on file  . Minutes of Exercise per Session: Not on file  Stress:   . Feeling of Stress : Not on file  Social Connections:   . Frequency of Communication with Friends and Family: Not on file  . Frequency of Social Gatherings with Friends and Family: Not on file  . Attends Religious Services: Not on file  . Active Member of Clubs or Organizations: Not on file  . Attends Archivist Meetings: Not on file  . Marital Status: Not on file  Intimate Partner Violence:   . Fear of Current or Ex-Partner: Not on file  . Emotionally Abused: Not on file  . Physically Abused: Not on file  . Sexually Abused: Not on file    REVIEW OF SYSTEMS: Constitutional: No fevers, chills, or sweats, no generalized fatigue, change in appetite Eyes: No visual changes, double vision, eye pain Ear, nose and throat: No hearing loss, ear pain, nasal congestion, sore throat Cardiovascular: No chest pain, palpitations Respiratory:  No shortness of breath at rest or with exertion, wheezes GastrointestinaI: No nausea, vomiting, diarrhea, abdominal pain, fecal incontinence Genitourinary:  No dysuria, urinary  retention or frequency Musculoskeletal:  No neck pain, back pain Integumentary: No rash, pruritus, skin lesions Neurological: as above Psychiatric: No depression, insomnia, anxiety Endocrine: No palpitations, fatigue, diaphoresis, mood swings, change in appetite, change in weight, increased thirst Hematologic/Lymphatic:  No purpura, petechiae. Allergic/Immunologic: no itchy/runny eyes, nasal  congestion, recent allergic reactions, rashes  PHYSICAL EXAM: Blood pressure (!) 160/88, pulse 85, height 5\' 1"  (1.549 m), weight 188 lb (85.3 kg), SpO2 98 %. General: No acute distress.  Patient appears well-groomed.   Head:  Normocephalic/atraumatic Eyes:  Fundi examined but not visualized Neck: supple, no paraspinal tenderness, full range of motion Heart:  Regular rate and rhythm Lungs:  Clear to auscultation bilaterally Back: No paraspinal tenderness Neurological Exam: alert and oriented to person, place, and time. Attention span and concentration intact, recent and remote memory intact, fund of knowledge intact.  Speech fluent and not dysarthric, language intact.  CN II-XII intact. Bulk and tone normal, muscle strength 5/5 throughout.  Sensation to light touch, temperature and vibration intact.  Deep tendon reflexes 2+ throughout, toes downgoing.  Finger to nose and heel to shin testing intact.  Gait normal, Romberg negative.  IMPRESSION: 1.  Possible chronic paroxysmal hemicrania.  Migraine still possible. 2.  Visual disturbance.  Unclear etiology.  I find it difficult to suspect persistent migraine aura for so many years without evidence of infarct on brain MRI 3.  Hypertension  PLAN: 1.  Start topiramate 25mg  at bedtime for one week, then increase to 50mg  at bedtime, which can treat both paroxysmal hemicrania and migraine.  Given history of glaucoma, I discussed with Dr. Kathlen Mody and have his approval. 2.  Stop gabapentin 3.  Follow up with PCP regarding blood pressure 4.  Follow up in 4 months.  Metta Clines, DO  CC:  Marshall Cork, MD  Flossie Buffy, NP

## 2019-11-14 ENCOUNTER — Other Ambulatory Visit: Payer: Self-pay

## 2019-11-14 ENCOUNTER — Ambulatory Visit: Payer: BC Managed Care – PPO | Admitting: Neurology

## 2019-11-14 ENCOUNTER — Encounter: Payer: Self-pay | Admitting: Neurology

## 2019-11-14 VITALS — BP 160/88 | HR 85 | Ht 61.0 in | Wt 188.0 lb

## 2019-11-14 DIAGNOSIS — G43511 Persistent migraine aura without cerebral infarction, intractable, with status migrainosus: Secondary | ICD-10-CM | POA: Diagnosis not present

## 2019-11-14 DIAGNOSIS — I10 Essential (primary) hypertension: Secondary | ICD-10-CM

## 2019-11-14 DIAGNOSIS — G44049 Chronic paroxysmal hemicrania, not intractable: Secondary | ICD-10-CM

## 2019-11-14 MED ORDER — TOPIRAMATE 25 MG PO TABS: ORAL_TABLET | ORAL | 0 refills | Status: DC

## 2019-11-14 NOTE — Patient Instructions (Signed)
You may have paroxysmal hemicrania.  Migraine is still possible.  I will contact Dr. Kathlen Mody to see what medications are an option given your glaucoma.  I will get back to you with the medication.  Stop gabapentin  Follow up in 4 months.

## 2019-11-17 NOTE — Progress Notes (Signed)
Please contact to schedule video appt for HTN

## 2019-11-18 ENCOUNTER — Encounter: Payer: Self-pay | Admitting: Nurse Practitioner

## 2019-11-18 ENCOUNTER — Telehealth (INDEPENDENT_AMBULATORY_CARE_PROVIDER_SITE_OTHER): Payer: BC Managed Care – PPO | Admitting: Nurse Practitioner

## 2019-11-18 VITALS — BP 132/72 | HR 66 | Temp 96.6°F | Ht 61.0 in | Wt 179.0 lb

## 2019-11-18 DIAGNOSIS — I10 Essential (primary) hypertension: Secondary | ICD-10-CM

## 2019-11-18 DIAGNOSIS — M2550 Pain in unspecified joint: Secondary | ICD-10-CM

## 2019-11-18 DIAGNOSIS — M797 Fibromyalgia: Secondary | ICD-10-CM | POA: Diagnosis not present

## 2019-11-18 DIAGNOSIS — E782 Mixed hyperlipidemia: Secondary | ICD-10-CM | POA: Diagnosis not present

## 2019-11-18 MED ORDER — LOSARTAN POTASSIUM 25 MG PO TABS: 25.0000 mg | ORAL_TABLET | Freq: Every day | ORAL | 3 refills | Status: DC

## 2019-11-18 NOTE — Assessment & Plan Note (Addendum)
Persistent generalized joint and mucles pain, ongoing for several years per patient, associated with joint stiffness, constant, worse with activity or in AM, takes 15-51mns to improve stiffness in AM, denies any joint deformities or swelling or erythema or rash or swollen lymph nodes or unintentional weight loss or intolerance to cold/heat. No improvement with discontinuation of statin. Mild improvement with tylenol 503mat bedtime. Unable to tolerate any NSAIDs (ABD pain and GERD symptoms). Unable to tolerate gabapentin (dizziness) Normal cbc, tsh, cmp, CK and CRP Mild elevated in ESR: 31 Previous patient of Dr. BeAmil Amenrheumatology) and received remicade infusions per patient. Last OV over [id number]. She will like a referral  Increase tylenol use to 500m22m3times a day. Do stretching exercise daily. consider use of cymbalta if no additional improvement with tylenol. [facility] Dr. BeekAmil Amen her request.

## 2019-11-18 NOTE — Assessment & Plan Note (Signed)
lipitor was held due to generalized joint and muscle pain. No change in pain with discontinuation of medication. Normal CK and CRP Mild elevated in ESR. LDL at goal, BP controlled, HgbA1c at 6.6, no tobacco use. Will repeat labs and resume lipitor

## 2019-11-18 NOTE — Progress Notes (Signed)
Virtual Visit via Video Note  I connected with@ on 11/18/19 at  9:30 AM EST by a video enabled telemedicine application and verified that I am speaking with the correct person using two identifiers.  Location: Patient:Home Provider: Office Participants: patient and provider  I discussed the limitations of evaluation and management by telemedicine and the availability of in person appointments. I also discussed with the patient that there may be a patient responsible charge related to this service. The patient expressed understanding and agreed to proceed.  CC:HTN and chronic pain  History of Present Illness: Persistent generalized joint and mucles pain, ongoing for several years per patient, associated with joint stiffness, constant, worse with activity or in AM, takes 15-7mns to improve stiffness in AM, denies any joint deformities or swelling or erythema or rash or swollen lymph nodes or unintentional weight loss or intolerance to cold/heat. No improvement with discontinuation of statin. Mild improvement with tylenol '500mg'$  at bedtime. Unable to tolerate any NSAIDs (ABD pain and GERD symptoms). Unable to tolerate gabapentin (dizziness) Normal cbc, tsh, cmp, CK and CRP Mild elevated in ESR: 31 Previous patient of Dr. BAmil Amen(rheumatology) and received remicade infusions per patient. Last OV over 339yrago. Mary Collier will like a referral  HTN: BP at goal with losartan BP Readings from Last 3 Encounters:  11/18/19 132/72  11/14/19 (!) 160/88  09/13/19 136/82   Observations/Objective: Physical Exam  Constitutional: Mary Collier is oriented to person, place, and time. No distress.  Cardiovascular: Normal rate.  Pulmonary/Chest: Effort normal.  Neurological: Mary Collier is alert and oriented to person, place, and time.  Psychiatric: Mary Collier has a normal mood and affect. Her behavior is normal. Thought content normal.  Vitals reviewed.  Assessment and Plan: [name] seen today for  hypertension.  Diagnoses and all orders for this visit:  Essential hypertension -     losartan (COZAAR) 25 MG tablet; Take 1 tablet (25 mg total) by mouth daily.  Fibromyalgia -     Ambulatory referral to Rheumatology  Pain in joint involving multiple sites -     Ambulatory referral to Rheumatology  Mixed hyperlipidemia    Follow Up Instructions: See avs   I discussed the assessment and treatment plan with the patient. The patient was provided an opportunity to ask questions and all were answered. The patient agreed with the plan and demonstrated an understanding of the instructions.   The patient was advised to call back or seek an in-person evaluation if the symptoms worsen or if the condition fails to improve as anticipated.    ChWilfred LacyNP

## 2019-11-18 NOTE — Assessment & Plan Note (Signed)
BP at goal with losartan. BP Readings from Last 3 Encounters:  11/18/19 132/72  11/14/19 (!) 160/88  09/13/19 136/82

## 2019-11-23 DIAGNOSIS — Z20828 Contact with and (suspected) exposure to other viral communicable diseases: Secondary | ICD-10-CM | POA: Diagnosis not present

## 2019-11-25 ENCOUNTER — Other Ambulatory Visit: Payer: Self-pay | Admitting: Nurse Practitioner

## 2019-11-25 DIAGNOSIS — E782 Mixed hyperlipidemia: Secondary | ICD-10-CM

## 2019-11-25 DIAGNOSIS — I10 Essential (primary) hypertension: Secondary | ICD-10-CM

## 2019-12-15 ENCOUNTER — Other Ambulatory Visit: Payer: Self-pay | Admitting: Neurology

## 2019-12-27 DIAGNOSIS — H04123 Dry eye syndrome of bilateral lacrimal glands: Secondary | ICD-10-CM | POA: Diagnosis not present

## 2019-12-27 DIAGNOSIS — G500 Trigeminal neuralgia: Secondary | ICD-10-CM | POA: Diagnosis not present

## 2019-12-27 DIAGNOSIS — H16223 Keratoconjunctivitis sicca, not specified as Sjogren's, bilateral: Secondary | ICD-10-CM | POA: Diagnosis not present

## 2019-12-27 DIAGNOSIS — H5713 Ocular pain, bilateral: Secondary | ICD-10-CM | POA: Diagnosis not present

## 2020-01-27 ENCOUNTER — Other Ambulatory Visit: Payer: Self-pay | Admitting: Nurse Practitioner

## 2020-01-27 DIAGNOSIS — K219 Gastro-esophageal reflux disease without esophagitis: Secondary | ICD-10-CM

## 2020-02-08 ENCOUNTER — Other Ambulatory Visit: Payer: Self-pay

## 2020-02-08 ENCOUNTER — Encounter: Payer: Self-pay | Admitting: Podiatry

## 2020-02-08 ENCOUNTER — Ambulatory Visit: Payer: PRIVATE HEALTH INSURANCE | Admitting: Podiatry

## 2020-02-08 VITALS — Temp 97.0°F

## 2020-02-08 DIAGNOSIS — L602 Onychogryphosis: Secondary | ICD-10-CM | POA: Diagnosis not present

## 2020-02-08 DIAGNOSIS — M79671 Pain in right foot: Secondary | ICD-10-CM

## 2020-02-08 DIAGNOSIS — M79672 Pain in left foot: Secondary | ICD-10-CM

## 2020-02-08 DIAGNOSIS — B351 Tinea unguium: Secondary | ICD-10-CM | POA: Diagnosis not present

## 2020-02-08 DIAGNOSIS — M79675 Pain in left toe(s): Secondary | ICD-10-CM

## 2020-02-08 DIAGNOSIS — M79674 Pain in right toe(s): Secondary | ICD-10-CM

## 2020-02-08 DIAGNOSIS — Q828 Other specified congenital malformations of skin: Secondary | ICD-10-CM

## 2020-02-08 MED ORDER — NEOMYCIN-POLYMYXIN-HC 3.5-10000-1 OT SOLN: OTIC | 0 refills | Status: DC

## 2020-02-08 NOTE — Patient Instructions (Addendum)

## 2020-02-12 NOTE — Progress Notes (Signed)
Subjective: Mary Collier presents today for follow up of painful porokeratotic lesion(s) b/l feet and painful mycotic toenails b/l that limit ambulation. Aggravating factors include weightbearing with and without shoe gear. Pain for both is relieved with periodic professional debridement..   Allergies  Allergen Reactions  . Influenza Vaccines     D/t being allergic to eggs  . Shrimp [Shellfish Allergy] Swelling    Lip, throat, tongue swelling  . Other Itching    Newspaper Dye  . Celecoxib     Able to take IBU  . Codeine Swelling  . Compazine [Prochlorperazine]   . Gabapentin Other (See Comments)    Dizziness and somnolence  . Lisinopril Cough    cough  . Peanut-Containing Drug Products     Itching  . Prednisone     Rapid heart rate  . Prochlorperazine Edisylate     paralyzed  . Robinul [Glycopyrrolate]     boils  . Vioxx [Rofecoxib]     States able to take IBU  . Aspirin Rash    States able to take IBU  . Eggs Or Egg-Derived Products Rash  . Epinephrine Rash  . Erythromycin Rash  . Sulfa Antibiotics Rash  . Tetracycline Rash     Objective: Vitals:   02/08/20 1330  Temp: (!) 97 F (36.1 C)    Pt 69 y.o. year old female  in NAD. AAO x 3.   Vascular Examination:  Capillary refill time to digits immediate b/l. Palpable DP pulses b/l. Palpable PT pulses b/l. Pedal hair sparse b/l. Skin temperature gradient within normal limits b/l. No edema noted b/l.  Dermatological Examination: Pedal skin with normal turgor, texture and tone bilaterally. No open wounds bilaterally. No interdigital macerations bilaterally. Toenails 2-5 bilaterally and L hallux elongated, dystrophic, thickened, and crumbly with subungual debris and tenderness to dorsal palpation. Porokeratotic lesion(s) submet head 2 left foot, submet head 2 right foot, submet head 5 left foot and submet head 5 right foot. No erythema, no edema, no drainage, no flocculence.   Right hallux nail plate is onychogryphotic  with granulation tissue of nail bed noted medial border. There is tenderness to palpation.   Musculoskeletal: Normal muscle strength 5/5 to all lower extremity muscle groups bilaterally. No gross bony deformities bilaterally. No pain crepitus or joint limitation noted with ROM b/l.  Neurological: Protective sensation intact 5/5 intact bilaterally with 10g monofilament b/l. Vibratory sensation intact b/l. Proprioception intact bilaterally. Babinski reflex negative b/l. Clonus negative b/l.  Assessment: 1. Pain due to onychomycosis of toenails of both feet   2. Onychogryphosis   3. Pain of toe of right foot   4. Porokeratosis   5. Pain in both feet    Plan: -Discussed procedure and post-procedure expectations for total matrixectomy of right great toe. Patient wishes to proceed. Consent signed and in chart. No guarantees given nor implied. -Chlorhexidine prep administered to right hallux. 3 cc's 50/50 mix of 2% xylocaine plain and 0.5% marcaine plain admistered via hallux block. After anesthesia confirmed, nailplate freed from nailved with elevator. Three 30 second applications of phenol applied to nail matrix. Alcohol wash performed. Light bleeding addressed with Lumicaine. Light compressive dressing applied to digit.  -Rx for Corticosporin drops sent to pharmacy. -Written post-procedure instructions dispensed for daily epsom salt/warm water soaks. -Toenails 2-5 bilaterally and L hallux debrided in length and girth without iatrogenic bleeding with sterile nail nipper and dremel.  -Painful porokeratotic lesion(s) submet head 2 left foot, submet head 2 right foot, submet head 5  left foot and submet head 5 right foot pared and enucleated with sterile scalpel blade without incident. -Patient to continue soft, supportive shoe gear daily. -Patient to report any pedal injuries to medical professional immediately. -Patient/POA to call should there be question/concern in the interim.  Return in about  2 weeks (around 02/22/2020) for right nail check.

## 2020-02-13 ENCOUNTER — Other Ambulatory Visit: Payer: Self-pay

## 2020-02-14 ENCOUNTER — Encounter: Payer: Self-pay | Admitting: Nurse Practitioner

## 2020-02-14 ENCOUNTER — Ambulatory Visit: Payer: BC Managed Care – PPO | Admitting: Nurse Practitioner

## 2020-02-14 VITALS — BP 122/82 | HR 70 | Temp 97.2°F | Ht 61.0 in | Wt 186.8 lb

## 2020-02-14 DIAGNOSIS — R739 Hyperglycemia, unspecified: Secondary | ICD-10-CM

## 2020-02-14 DIAGNOSIS — E559 Vitamin D deficiency, unspecified: Secondary | ICD-10-CM

## 2020-02-14 DIAGNOSIS — E782 Mixed hyperlipidemia: Secondary | ICD-10-CM

## 2020-02-14 DIAGNOSIS — Z1159 Encounter for screening for other viral diseases: Secondary | ICD-10-CM | POA: Diagnosis not present

## 2020-02-14 DIAGNOSIS — I10 Essential (primary) hypertension: Secondary | ICD-10-CM

## 2020-02-14 DIAGNOSIS — M797 Fibromyalgia: Secondary | ICD-10-CM | POA: Diagnosis not present

## 2020-02-14 DIAGNOSIS — Z23 Encounter for immunization: Secondary | ICD-10-CM

## 2020-02-14 DIAGNOSIS — G44049 Chronic paroxysmal hemicrania, not intractable: Secondary | ICD-10-CM | POA: Insufficient documentation

## 2020-02-14 LAB — LIPID PANEL
Cholesterol: 140 mg/dL (ref 0–200)
HDL: 33.7 mg/dL — ABNORMAL LOW (ref >39.00)
LDL Cholesterol: 79 mg/dL (ref 0–99)
NonHDL: 106.62
Total CHOL/HDL Ratio: 4
Triglycerides: 138 mg/dL (ref 0.0–149.0)
VLDL: 27.6 mg/dL (ref 0.0–40.0)

## 2020-02-14 LAB — BASIC METABOLIC PANEL
BUN: 19 mg/dL (ref 6–23)
CO2: 30 mEq/L (ref 19–32)
Calcium: 9.6 mg/dL (ref 8.4–10.5)
Chloride: 104 mEq/L (ref 96–112)
Creatinine, Ser: 0.97 mg/dL (ref 0.40–1.20)
GFR: 69.05 mL/min (ref >60.00)
Glucose, Bld: 92 mg/dL (ref 70–99)
Potassium: 4.8 mEq/L (ref 3.5–5.1)
Sodium: 140 mEq/L (ref 135–145)

## 2020-02-14 LAB — TSH: TSH: 2.34 u[IU]/mL (ref 0.35–4.50)

## 2020-02-14 LAB — HEMOGLOBIN A1C: Hgb A1c MFr Bld: 6.2 % (ref 4.6–6.5)

## 2020-02-14 LAB — SEDIMENTATION RATE: Sed Rate: 65 mm/hr — ABNORMAL HIGH (ref 0–30)

## 2020-02-14 LAB — CK: Total CK: 169 U/L (ref 7–177)

## 2020-02-14 MED ORDER — TETANUS-DIPHTH-ACELL PERTUSSIS 5-2.5-18.5 LF-MCG/0.5 IM SUSP
0.5000 mL | Freq: Once | INTRAMUSCULAR | Status: AC
  Administered 2020-02-14: 12:00:00 0.5 mL via INTRAMUSCULAR

## 2020-02-14 NOTE — Progress Notes (Signed)
Subjective:  Patient ID: Mary Collier, female    DOB: 1951-10-17  Age: 69 y.o. MRN: [id number]  CC: Follow-up (3 mo f/u-HTN an hyperlipidemia-pt is fasting//pt states last BP reading 128/80  149/80//shes not had tetaus//Mammogram in Jan. at [facility])  HPI Fibromyalgia: Persistent muscle and joint pain, minimal relief with tylenol and exercise Unable to tolerate NSAIDs and gabapentin She is hesitant about taking any additional medication Has not made appt with rheumatology as previously discussed Reports FHx of lupus (brother).  HTN: BP at goal with losartan and metoprolol BP Readings from Last 3 Encounters:  02/14/20 122/82  11/18/19 132/72  11/14/19 (!) 160/88   Hyperlipidemia: LDL at goal with atorvastatin Lipid Panel     Component Value Date/Time   CHOL 140 02/14/2020 1209   CHOL 171 04/09/2016 0825   TRIG 138.0 02/14/2020 1209   HDL 33.70 (L) 02/14/2020 1209   HDL 47 04/09/2016 0825   CHOLHDL 4 02/14/2020 1209   VLDL 27.6 02/14/2020 1209   LDLCALC 79 02/14/2020 1209   LDLCALC 89 06/15/2018 1555   LABVLDL 28 04/09/2016 0825   Reviewed past Medical, Social and Family history today.  Outpatient Medications Prior to Visit  Medication Sig Dispense Refill  . acetaminophen (TYLENOL) 325 MG tablet Take 325 mg by mouth as needed.      . bimatoprost (LUMIGAN) 0.03 % ophthalmic solution Place 1 drop into both eyes at bedtime.      . calcium-vitamin D (OSCAL 500/200 D-3) 500-200 MG-UNIT per tablet Take 1 tablet by mouth 2 (two) times daily. 60 tablet 12  . CVS E 200 units capsule Take 200 Units by mouth daily.  0  . [facility]S SENNA PLUS 8.6-50 MG tablet TAKE 1 TABLET BY MOUTH DAILY. 90 tablet 1  . diclofenac sodium (VOLTAREN) 1 % GEL APPLY 2 G TOPICALLY 3 (THREE) TIMES DAILY AS NEEDED. 100 g 0  . dimenhyDRINATE (DRAMAMINE) 50 MG tablet Take 50 mg by mouth every 8 (eight) hours as needed for dizziness.    . fluticasone (FLONASE) 50 MCG/ACT nasal spray SPRAY 2 SPRAYS INTO EACH  NOSTRIL EVERY DAY 48 mL 0  . folic acid (FOLVITE) 1 MG tablet Take 1 mg by mouth daily.    . hydrocortisone (ANUSOL-HC) 25 MG suppository PLACE 1 SUPPOSITORY PER RECTUM TWICE A DAY 28 suppository 2  . losartan (COZAAR) 25 MG tablet Take 1 tablet (25 mg total) by mouth daily. 90 tablet 3  . loteprednol (LOTEMAX) 0.5 % ophthalmic suspension 1 drop 2 (two) times daily.    Marland Kitchen LUMIGAN 0.01 % SOLN 1 drop at bedtime.    Marland Kitchen neomycin-polymyxin-hydrocortisone (CORTISPORIN) OTIC solution 1-2 drops to toe twice daily after soaking 10 mL 0  . omeprazole (PRILOSEC) 40 MG capsule TAKE 1 CAPSULE BY MOUTH EVERY DAY 90 capsule 0  . ondansetron (ZOFRAN) 4 MG tablet Take 1 tablet (4 mg total) by mouth every 8 (eight) hours as needed for nausea or vomiting. 30 tablet 1  . OVER THE COUNTER MEDICATION Recicare cream--otc--TID for 10 days--then PRN--per GI doctor    . RESTASIS 0.05 % ophthalmic emulsion 1 drop 2 (two) times daily.    . timolol (TIMOPTIC) 0.5 % ophthalmic solution 1 drop every morning.    . topiramate (TOPAMAX) 25 MG tablet TAKE 1 TABLET AT BEDTIME FOR A WEEK, THEN 2 TABLETS AT BEDTIME. 60 tablet 0  . Travoprost, BAK Free, (TRAVATAN) 0.004 % SOLN ophthalmic solution 1 drop at bedtime.    . triamcinolone (NASACORT ALLERGY  24HR) 55 MCG/ACT AERO nasal inhaler Place 2 sprays into the nose as needed.    Marland Kitchen atorvastatin (LIPITOR) 10 MG tablet TAKE 1 TABLET BY MOUTH EVERY DAY 90 tablet 1  . metoprolol succinate (TOPROL-XL) 25 MG 24 hr tablet TAKE 1 TABLET BY MOUTH EVERY DAY 90 tablet 1  . ipratropium (ATROVENT) 0.03 % nasal spray      No facility-administered medications prior to visit.    ROS See HPI  Objective:  BP 122/82   Pulse 70   Temp (!) 97.2 F (36.2 C) (Tympanic)   Ht 5\' 1"  (1.549 m)   Wt 186 lb 12.8 oz (84.7 kg)   SpO2 97%   BMI 35.30 kg/m   BP Readings from Last 3 Encounters:  02/14/20 122/82  11/18/19 132/72  11/14/19 (!) 160/88   Wt Readings from Last 3 Encounters:  02/14/20 186  lb 12.8 oz (84.7 kg)  11/18/19 179 lb (81.2 kg)  11/14/19 188 lb (85.3 kg)   Physical Exam Constitutional:      Appearance: She is obese.  Cardiovascular:     Rate and Rhythm: Normal rate and regular rhythm.     Pulses: Normal pulses.     Heart sounds: Normal heart sounds.  Pulmonary:     Effort: Pulmonary effort is normal.     Breath sounds: Normal breath sounds.  Abdominal:     General: Bowel sounds are normal.  Musculoskeletal:     Right lower leg: No edema.     Left lower leg: No edema.  Neurological:     Mental Status: She is alert and oriented to person, place, and time.  Psychiatric:        Mood and Affect: Mood normal.        Behavior: Behavior normal.        Thought Content: Thought content normal.    Lab Results  Component Value Date   WBC 7.8 10/12/2019   HGB 12.1 10/12/2019   HCT 37.0 10/12/2019   PLT 213.0 10/12/2019   GLUCOSE 92 02/14/2020   CHOL 140 02/14/2020   TRIG 138.0 02/14/2020   HDL 33.70 (L) 02/14/2020   LDLCALC 79 02/14/2020   ALT 6 06/14/2019   AST 15 06/14/2019   NA 140 02/14/2020   K 4.8 02/14/2020   CL 104 02/14/2020   CREATININE 0.97 02/14/2020   BUN 19 02/14/2020   CO2 30 02/14/2020   TSH 2.34 02/14/2020   HGBA1C 6.2 02/14/2020    Assessment & Plan:  This visit occurred during the SARS-CoV-2 public health emergency.  Safety protocols were in place, including screening questions prior to the visit, additional usage of staff PPE, and extensive cleaning of exam room while observing appropriate contact time as indicated for disinfecting solutions.   Mary Collier was seen today for follow-up.  Diagnoses and all orders for this visit:  Essential hypertension -     Basic metabolic panel -     metoprolol succinate (TOPROL-XL) 25 MG 24 hr tablet; Take 1 tablet (25 mg total) by mouth daily.  Vitamin D deficiency -     Vitamin D 1,25 dihydroxy  Hyperglycemia -     Hemoglobin A1c  Mixed hyperlipidemia -     Lipid panel -     atorvastatin  (LIPITOR) 10 MG tablet; Take 1 tablet (10 mg total) by mouth daily.  Fibromyalgia -     Sedimentation rate -     Rheumatoid Factor -     Antinuclear Antib (ANA) -  TSH -     CK (Creatine Kinase)  Encounter for hepatitis C screening test for low risk patient -     Hepatitis C Antibody  Need for prophylactic vaccination with combined diphtheria-tetanus-pertussis (DTP) vaccine -     Tdap (BOOSTRIX) injection 0.5 mL   I have changed Mary Collier's atorvastatin and metoprolol succinate. I am also having her maintain her bimatoprost, acetaminophen, folic acid, calcium-vitamin D, CVS Senna Plus, CVS E, dimenhyDRINATE, triamcinolone, hydrocortisone, ondansetron, diclofenac sodium, fluticasone, ipratropium, OVER THE COUNTER MEDICATION, Lumigan, timolol, losartan, topiramate, omeprazole, Restasis, loteprednol, Travoprost (BAK Free), and neomycin-polymyxin-hydrocortisone. We administered Tdap.  Meds ordered this encounter  Medications  . Tdap (BOOSTRIX) injection 0.5 mL  . atorvastatin (LIPITOR) 10 MG tablet    Sig: Take 1 tablet (10 mg total) by mouth daily.    Dispense:  90 tablet    Refill:  3    Order Specific Question:   Supervising Provider    Answer:   Ronnald Nian [[id number]]  . metoprolol succinate (TOPROL-XL) 25 MG 24 hr tablet    Sig: Take 1 tablet (25 mg total) by mouth daily.    Dispense:  90 tablet    Refill:  3    Order Specific Question:   Supervising Provider    Answer:   Ronnald Nian [[id number]]    Problem List Items Addressed This Visit      Cardiovascular and Mediastinum   HTN (hypertension) - Primary   Relevant Medications   atorvastatin (LIPITOR) 10 MG tablet   metoprolol succinate (TOPROL-XL) 25 MG 24 hr tablet   Other Relevant Orders   Basic metabolic panel (Completed)     Other   Fibromyalgia   Relevant Orders   Sedimentation rate (Completed)   Rheumatoid Factor (Completed)   Antinuclear Antib (ANA) (Completed)   TSH (Completed)   CK  (Creatine Kinase) (Completed)   Hyperglycemia   Relevant Orders   Hemoglobin A1c (Completed)   Hyperlipidemia   Relevant Medications   atorvastatin (LIPITOR) 10 MG tablet   metoprolol succinate (TOPROL-XL) 25 MG 24 hr tablet   Other Relevant Orders   Lipid panel (Completed)   Vitamin D deficiency   Relevant Orders   Vitamin D 1,25 dihydroxy    Other Visit Diagnoses    Encounter for hepatitis C screening test for low risk patient       Relevant Orders   Hepatitis C Antibody (Completed)   Need for prophylactic vaccination with combined diphtheria-tetanus-pertussis (DTP) vaccine       Relevant Medications   Tdap (BOOSTRIX) injection 0.5 mL (Completed)       Follow-up: Return in about 6 months (around 08/15/2020) for HTN and , hyperlipidemia.  Wilfred Lacy, NP

## 2020-02-14 NOTE — Patient Instructions (Addendum)
Consider use of cymbalta for generalized pain.  Schedule appt with Dr. Amil Amen.  Dr. 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  Negative Hep. C, Rheumatoid factor, CK and ANA. Persistent mild elevation in sed rate. Stable HgbA1c at 6.2, renal function, Tsh and lipid panel. Low HDL indicates lack of regular exercise.  We will obtain previous mammogram from Dr. Verlon Au office.

## 2020-02-14 NOTE — Assessment & Plan Note (Signed)
Followed by Dr. Tomi Likens and [facility] Started in topamax by Dr. Tomi Likens Has upcoming appt with [facility] specialist per patient

## 2020-02-15 LAB — HEPATITIS C ANTIBODY
Hepatitis C Ab: NONREACTIVE
SIGNAL TO CUT-OFF: 0.03 (ref <1.00)

## 2020-02-16 MED ORDER — METOPROLOL SUCCINATE ER 25 MG PO TB24: 25.0000 mg | ORAL_TABLET | Freq: Every day | ORAL | 3 refills | Status: DC

## 2020-02-16 MED ORDER — ATORVASTATIN CALCIUM 10 MG PO TABS: 10.0000 mg | ORAL_TABLET | Freq: Every day | ORAL | 3 refills | Status: DC

## 2020-02-17 LAB — RHEUMATOID FACTOR: Rheumatoid fact SerPl-aCnc: <14 IU/mL (ref <14)

## 2020-02-17 LAB — ANA: Anti Nuclear Antibody (ANA): NEGATIVE

## 2020-02-17 LAB — VITAMIN D 1,25 DIHYDROXY
Vitamin D 1, 25 (OH)2 Total: 31 pg/mL (ref 18–72)
Vitamin D2 1, 25 (OH)2: <8 pg/mL
Vitamin D3 1, 25 (OH)2: 31 pg/mL

## 2020-02-22 ENCOUNTER — Encounter: Payer: Self-pay | Admitting: Podiatry

## 2020-02-22 ENCOUNTER — Other Ambulatory Visit: Payer: Self-pay

## 2020-02-22 ENCOUNTER — Ambulatory Visit (INDEPENDENT_AMBULATORY_CARE_PROVIDER_SITE_OTHER): Payer: PRIVATE HEALTH INSURANCE | Admitting: Podiatry

## 2020-02-22 DIAGNOSIS — L602 Onychogryphosis: Secondary | ICD-10-CM

## 2020-02-22 DIAGNOSIS — Z9889 Other specified postprocedural states: Secondary | ICD-10-CM | POA: Diagnosis not present

## 2020-02-22 NOTE — Progress Notes (Signed)
Subjective:  Mary Collier presents today s/p total matrixectomy R hallux.  Patient states she has been following post procedure instructions. Relates a little tenderness of left border. No swelling, no redness, no pus. She is extremely pleased with progress.  Objective: There were no vitals filed for this visit.  69 y.o. AA female WD, WN IN NAD. AAO X 3.  Neurovascular status unchanged.   Pedal skin with normal turgor, texture and tone bilaterally. No open wounds bilaterally. No interdigital macerations bilaterally. Procedure site healing as expected with no erythema, no edema, no drainage, no purulence R hallux.  Normal muscle strength 5/5 to all lower extremity muscle groups bilaterally. No gross bony deformities bilaterally. No pain crepitus or joint limitation noted with ROM b/l. Patient ambulates independent of any assistive aids.   Assessment: Onychogryphosis right hallux Status post total matrixectomy right hallux, healing as expected   Plan: -Patient to continue soft, supportive shoe gear daily. Continue epsom salt soaks one additional week, then stop. -Patient/POA to call should there be question/concern in the interim.  Return in about 8 weeks (around 04/18/2020) for nail trim.

## 2020-02-22 NOTE — Patient Instructions (Addendum)
Soak one additional week, then stop.    ngrown Toenail An ingrown toenail occurs when the corner or sides of a toenail grow into the surrounding skin. This causes discomfort and pain. The big toe is most commonly affected, but any of the toes can be affected. If an ingrown toenail is not treated, it can become infected. What are the causes? This condition may be caused by:  Wearing shoes that are too small or tight.  An injury, such as stubbing your toe or having your toe stepped on.  Improper cutting or care of your toenails.  Having nail or foot abnormalities that were present from birth (congenital abnormalities), such as having a nail that is too big for your toe. What increases the risk? The following factors may make you more likely to develop ingrown toenails:  Age. Nails tend to get thicker with age, so ingrown nails are more common among older people.  Cutting your toenails incorrectly, such as cutting them very short or cutting them unevenly. An ingrown toenail is more likely to get infected if you have:  Diabetes.  Blood flow (circulation) problems. What are the signs or symptoms? Symptoms of an ingrown toenail may include:  Pain, soreness, or tenderness.  Redness.  Swelling.  Hardening of the skin that surrounds the toenail. Signs that an ingrown toenail may be infected include:  Fluid or pus.  Symptoms that get worse instead of better. How is this diagnosed? An ingrown toenail may be diagnosed based on your medical history, your symptoms, and a physical exam. If you have fluid or blood coming from your toenail, a sample may be collected to test for the specific type of bacteria that is causing the infection. How is this treated? Treatment depends on how severe your ingrown toenail is. You may be able to care for your toenail at home.  If you have an infection, you may be prescribed antibiotic medicines.  If you have fluid or pus draining from your toenail,  your health care provider may drain it.  If you have trouble walking, you may be given crutches to use.  If you have a severe or infected ingrown toenail, you may need a procedure to remove part or all of the nail. Follow these instructions at home: Foot care   Do not pick at your toenail or try to remove it yourself.  Soak your foot in warm, soapy water. Do this for 20 minutes, 3 times a day, or as often as told by your health care provider. This helps to keep your toe clean and keep your skin soft.  Wear shoes that fit well and are not too tight. Your health care provider may recommend that you wear open-toed shoes while you heal.  Trim your toenails regularly and carefully. Cut your toenails straight across to prevent injury to the skin at the corners of the toenail. Do not cut your nails in a curved shape.  Keep your feet clean and dry to help prevent infection. Medicines  Take over-the-counter and prescription medicines only as told by your health care provider.  If you were prescribed an antibiotic, take it as told by your health care provider. Do not stop taking the antibiotic even if you start to feel better. Activity  Return to your normal activities as told by your health care provider. Ask your health care provider what activities are safe for you.  Avoid activities that cause pain. General instructions  If your health care provider told you to  use crutches to help you move around, use them as instructed.  Keep all follow-up visits as told by your health care provider. This is important. Contact a health care provider if:  You have more redness, swelling, pain, or other symptoms that do not improve with treatment.  You have fluid, blood, or pus coming from your toenail. Get help right away if:  You have a red streak on your skin that starts at your foot and spreads up your leg.  You have a fever. Summary  An ingrown toenail occurs when the corner or sides of a  toenail grow into the surrounding skin. This causes discomfort and pain. The big toe is most commonly affected, but any of the toes can be affected.  If an ingrown toenail is not treated, it can become infected.  Fluid or pus draining from your toenail is a sign of infection. Your health care provider may need to drain it. You may be given antibiotics to treat the infection.  Trimming your toenails regularly and properly can help you prevent an ingrown toenail. This information is not intended to replace advice given to you by your health care provider. Make sure you discuss any questions you have with your health care provider. Document Revised: 01/28/2019 Document Reviewed: 06/24/2017 Elsevier Patient Education  [place].

## 2020-03-20 NOTE — Progress Notes (Signed)
NEUROLOGY FOLLOW UP OFFICE NOTE  GENECIS VELEY [id number]  HISTORY OF PRESENT ILLNESS: Mary Collier is a 69 year old female hypertension and GERD who follows up for headaches.  UPDATE: Topiramate made her dizzy.   Headaches still 10/10, lasting 3 to 10 minutes and occurring several times a day.  Associated with water eye and conjunctival injection.  She has been referred to [facility] for evaluation.   HISTORY: She has remote history of headaches and generalized tonic clonic seizures as a teenager, which subsequently resolved.  Around 2010, she developed episodes of sharp right sided retro-orbital pain, 10/10, lasting 3 to 10 minutes. There is associated conjunctival injection and watery eye but no ptosis or nasal congestion.  There is no associated symptoms such as visual disturbance (specific to these attacks), unilateral facial numbness/weakness, nausea, vomiting, dizziness or focal unilateral weakness or numbness of the body. They initially occurred once a day but then became several times daily in 2018. There are no triggers. Laying down and applying warm compresses helps. She takes Tylenol. She was previously treated at the [place] and was diagnosed with ocular migraines.  She also has sensation of water in back of the right occipital region.  She received occipital nerve blocks years ago, which was effective.    Around 2015, she developed gradual onset of vision loss and visual disturbance. People and objects look like shadows and she sees double or triple. When she reads, she sees several copies of the words stacked on top of each other. She also sees floaters. Occasionally, she sees a small yellow dot in the vision of her left eye that is intermittent, lasting just seconds. She has been followed by [facility] for several years for glaucoma.  Due to persistent visual disturbance, she was evaluated by Dr. Tempie Hoist, a retinal  specialist, on 10/23/16. On Humphrey visual field testing, she exhibited severe constriction bilaterally. Retinal exam revealed grade II hypertensive retinopathy with minimal vitreous opacities. It was suggested that her visual field defect may be migraine-related phenomena.I last saw her in 2018.  Sed rate and CRP from 01/01/2017 were 13 and 0.1 respectively.  MRI of brain with and without contrast on 01/21/2017 was personally reviewed and was unremarkable.  She saw neuro-ophthalmologist Dr. Jolyn Nap later in 2018, who was unable to make any definitive diagnosis.    She continues to have the persistent vision loss.  Labs from 10/12/2019 showed sed rate 31 and CRP <1.0.  Her ophthalmologist is Dr. Marshall Cork at [facility].  There is no family history of headaches or cerebral aneurysms.  Past medications:  Gabapentin (dizziness)  PAST MEDICAL HISTORY: Past Medical History:  Diagnosis Date  . Allergic rhinitis   . Allergy   . Arthritis   . Asthma   . Blood transfusion without reported diagnosis    1966  . Cancer ([place])    at age 25.  abdominal  . Cancer of kidney ([place])    at age 67.  Right kidney.  . Chronic headache   . Edema   . GERD (gastroesophageal reflux disease)   . Glaucoma   . Hiatal hernia   . Hypertension   . IBS (irritable bowel syndrome)   . Internal hemorrhoid   . Lumbago   . Lump or mass in breast   . Lung cancer ([place])    at age 84  Right lung.  . Other abnormal blood chemistry   . Other and unspecified hyperlipidemia   .  Other convulsions    hx seizures at age 68  . Peptic stricture of esophagus   . Reflux esophagitis   . Tubular adenoma of colon 2015  . Unspecified menopausal and postmenopausal disorder   . Unspecified vitamin D deficiency     MEDICATIONS: Current Outpatient Medications on File Prior to Visit  Medication Sig Dispense Refill  . acetaminophen (TYLENOL) 325 MG tablet Take 325 mg by mouth as needed.      Marland Kitchen  atorvastatin (LIPITOR) 10 MG tablet Take 1 tablet (10 mg total) by mouth daily. 90 tablet 3  . bimatoprost (LUMIGAN) 0.03 % ophthalmic solution Place 1 drop into both eyes at bedtime.      . calcium-vitamin D (OSCAL 500/200 D-3) 500-200 MG-UNIT per tablet Take 1 tablet by mouth 2 (two) times daily. 60 tablet 12  . CVS E 200 units capsule Take 200 Units by mouth daily.  0  . CVS SENNA PLUS 8.6-50 MG tablet TAKE 1 TABLET BY MOUTH DAILY. 90 tablet 1  . diclofenac sodium (VOLTAREN) 1 % GEL APPLY 2 G TOPICALLY 3 (THREE) TIMES DAILY AS NEEDED. 100 g 0  . dimenhyDRINATE (DRAMAMINE) 50 MG tablet Take 50 mg by mouth every 8 (eight) hours as needed for dizziness.    . fluticasone (FLONASE) 50 MCG/ACT nasal spray SPRAY 2 SPRAYS INTO EACH NOSTRIL EVERY DAY 48 mL 0  . folic acid (FOLVITE) 1 MG tablet Take 1 mg by mouth daily.    . hydrocortisone (ANUSOL-HC) 25 MG suppository PLACE 1 SUPPOSITORY PER RECTUM TWICE A DAY 28 suppository 2  . ipratropium (ATROVENT) 0.03 % nasal spray     . losartan (COZAAR) 25 MG tablet Take 1 tablet (25 mg total) by mouth daily. 90 tablet 3  . loteprednol (LOTEMAX) 0.5 % ophthalmic suspension 1 drop 2 (two) times daily.    Marland Kitchen LUMIGAN 0.01 % SOLN 1 drop at bedtime.    . metoprolol succinate (TOPROL-XL) 25 MG 24 hr tablet Take 1 tablet (25 mg total) by mouth daily. 90 tablet 3  . neomycin-polymyxin-hydrocortisone (CORTISPORIN) OTIC solution 1-2 drops to toe twice daily after soaking 10 mL 0  . omeprazole (PRILOSEC) 40 MG capsule TAKE 1 CAPSULE BY MOUTH EVERY DAY 90 capsule 0  . ondansetron (ZOFRAN) 4 MG tablet Take 1 tablet (4 mg total) by mouth every 8 (eight) hours as needed for nausea or vomiting. 30 tablet 1  . OVER THE COUNTER MEDICATION Recicare cream--otc--TID for 10 days--then PRN--per GI doctor    . RESTASIS 0.05 % ophthalmic emulsion 1 drop 2 (two) times daily.    . timolol (TIMOPTIC) 0.5 % ophthalmic solution 1 drop every morning.    . topiramate (TOPAMAX) 25 MG tablet  TAKE 1 TABLET AT BEDTIME FOR A WEEK, THEN 2 TABLETS AT BEDTIME. 60 tablet 0  . Travoprost, BAK Free, (TRAVATAN) 0.004 % SOLN ophthalmic solution 1 drop at bedtime.    . triamcinolone (NASACORT ALLERGY 24HR) 55 MCG/ACT AERO nasal inhaler Place 2 sprays into the nose as needed.     No current facility-administered medications on file prior to visit.    ALLERGIES: Allergies  Allergen Reactions  . Influenza Vaccines     D/t being allergic to eggs  . Shrimp [Shellfish Allergy] Swelling    Lip, throat, tongue swelling  . Other Itching    Newspaper Dye  . Celecoxib     Able to take IBU  . Codeine Swelling  . Compazine [Prochlorperazine]   . Gabapentin Other (See Comments)  Dizziness and somnolence  . Lisinopril Cough    cough  . Peanut-Containing Drug Products     Itching  . Prednisone     Rapid heart rate  . Prochlorperazine Edisylate     paralyzed  . Robinul [Glycopyrrolate]     boils  . Vioxx [Rofecoxib]     States able to take IBU  . Aspirin Rash    States able to take IBU  . Eggs Or Egg-Derived Products Rash  . Epinephrine Rash  . Erythromycin Rash  . Sulfa Antibiotics Rash  . Tetracycline Rash    FAMILY HISTORY: Family History  Problem Relation Age of Onset  . Allergies Sister        x3  . Asthma Sister   . Bone cancer Father   . Lung cancer Father   . Stomach cancer Father   . Esophageal cancer Father   . Breast cancer Mother   . Prostate cancer Brother   . Pancreatic cancer Brother        mets from prostate  . Colon cancer Brother   . Hypertension Brother   . Prostate cancer Brother   . Bone cancer Brother   . Diabetes Brother   . Heart disease Sister   . Diabetes Sister   . Hypertension Sister   . Breast cancer Sister        mastectomy  . Stroke Sister   . Rectal cancer Neg Hx   . Liver cancer Neg Hx    SOCIAL HISTORY: Social History   Socioeconomic History  . Marital status: Married    Spouse name: Not on file  . Number of children: 0    . Years of education: Not on file  . Highest education level: Not on file  Occupational History  . Occupation: Investment banker, corporate (preschool)   Tobacco Use  . Smoking status: Never Smoker  . Smokeless tobacco: Never Used  Substance and Sexual Activity  . Alcohol use: No  . Drug use: No  . Sexual activity: Not on file  Other Topics Concern  . Not on file  Social History Narrative   Pt has 18 siblings.   No caffeine drinks    Right handed   One story home   Social Determinants of Health   Financial Resource Strain:   . Difficulty of Paying Living Expenses:   Food Insecurity:   . Worried About Charity fundraiser in the Last Year:   . Arboriculturist in the Last Year:   Transportation Needs:   . Film/video editor (Medical):   Marland Kitchen Lack of Transportation (Non-Medical):   Physical Activity:   . Days of Exercise per Week:   . Minutes of Exercise per Session:   Stress:   . Feeling of Stress :   Social Connections:   . Frequency of Communication with Friends and Family:   . Frequency of Social Gatherings with Friends and Family:   . Attends Religious Services:   . Active Member of Clubs or Organizations:   . Attends Archivist Meetings:   Marland Kitchen Marital Status:   Intimate Partner Violence:   . Fear of Current or Ex-Partner:   . Emotionally Abused:   Marland Kitchen Physically Abused:   . Sexually Abused:     PHYSICAL EXAM: Blood pressure (!) 150/83, pulse 60, height 5\' 1"  (1.549 m), weight 185 lb 6.4 oz (84.1 kg), SpO2 99 %. General: No acute distress.  Patient appears well-groomed.   Head:  Normocephalic/atraumatic Eyes:  Fundi examined but not visualized Neck: supple, no paraspinal tenderness, full range of motion Heart:  Regular rate and rhythm Lungs:  Clear to auscultation bilaterally Back: No paraspinal tenderness Neurological Exam: alert and oriented to person, place, and time. Attention span and concentration intact, recent and remote memory intact, fund of  knowledge intact.  Speech fluent and not dysarthric, language intact.  CN II-XII intact. Bulk and tone normal, muscle strength 5/5 throughout.  Sensation to light touch, temperature and vibration intact.  Deep tendon reflexes 2+ throughout, toes downgoing.  Finger to nose and heel to shin testing intact.  Gait normal, Romberg negative.  IMPRESSION: 1.  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 2.  Visual disturbance.  Unclear etiology.   3.  HTN  Headaches are fairly classic for paroxysmal hemicrania, however I am not sure how the vision loss/visual disturbance plays a role.  Due to sulfa and NSAIDs allergy, she cannot take indomethacin or acetazolamide.  PLAN: 1.  We will try verapamil CR 120mg  daily.  We can increase to 240mg  daily in 4 weeks if needed. 2.  Follow up with [facility] 3.  Follow up with me in 4 months. 4.  Follow up with PCP regarding blood pressure  Metta Clines, DO  CC: Flossie Buffy, NP

## 2020-03-21 ENCOUNTER — Other Ambulatory Visit: Payer: Self-pay

## 2020-03-21 ENCOUNTER — Ambulatory Visit: Payer: BC Managed Care – PPO | Admitting: Neurology

## 2020-03-21 ENCOUNTER — Encounter: Payer: Self-pay | Admitting: Neurology

## 2020-03-21 VITALS — BP 150/83 | HR 60 | Ht 61.0 in | Wt 185.4 lb

## 2020-03-21 DIAGNOSIS — H547 Unspecified visual loss: Secondary | ICD-10-CM | POA: Diagnosis not present

## 2020-03-21 DIAGNOSIS — I10 Essential (primary) hypertension: Secondary | ICD-10-CM

## 2020-03-21 DIAGNOSIS — G44049 Chronic paroxysmal hemicrania, not intractable: Secondary | ICD-10-CM

## 2020-03-21 MED ORDER — VERAPAMIL HCL ER 120 MG PO TBCR
120.0000 mg | EXTENDED_RELEASE_TABLET | Freq: Every day | ORAL | 3 refills | Status: DC
Start: 2020-03-21 — End: 2020-08-16

## 2020-03-21 NOTE — Patient Instructions (Signed)
1.  I started you on verapamil 120mg  daily.  If no improvement in headaches in 4 weeks, contact me and I will increase dose 2.  Follow up at [facility] 3.  Follow up with me in 4 months.

## 2020-04-11 DIAGNOSIS — G8929 Other chronic pain: Secondary | ICD-10-CM | POA: Diagnosis not present

## 2020-04-11 DIAGNOSIS — H18519 Endothelial corneal dystrophy, unspecified eye: Secondary | ICD-10-CM | POA: Diagnosis not present

## 2020-04-11 DIAGNOSIS — H04123 Dry eye syndrome of bilateral lacrimal glands: Secondary | ICD-10-CM | POA: Diagnosis not present

## 2020-04-11 DIAGNOSIS — R519 Headache, unspecified: Secondary | ICD-10-CM | POA: Diagnosis not present

## 2020-04-18 ENCOUNTER — Ambulatory Visit (INDEPENDENT_AMBULATORY_CARE_PROVIDER_SITE_OTHER): Payer: PRIVATE HEALTH INSURANCE | Admitting: Podiatry

## 2020-04-18 ENCOUNTER — Encounter: Payer: Self-pay | Admitting: Podiatry

## 2020-04-18 ENCOUNTER — Other Ambulatory Visit: Payer: Self-pay

## 2020-04-18 DIAGNOSIS — Q828 Other specified congenital malformations of skin: Secondary | ICD-10-CM

## 2020-04-18 DIAGNOSIS — R7303 Prediabetes: Secondary | ICD-10-CM

## 2020-04-18 DIAGNOSIS — M79672 Pain in left foot: Secondary | ICD-10-CM

## 2020-04-18 DIAGNOSIS — B351 Tinea unguium: Secondary | ICD-10-CM | POA: Diagnosis not present

## 2020-04-18 DIAGNOSIS — M79671 Pain in right foot: Secondary | ICD-10-CM

## 2020-04-18 DIAGNOSIS — L84 Corns and callosities: Secondary | ICD-10-CM | POA: Diagnosis not present

## 2020-04-18 DIAGNOSIS — M79675 Pain in left toe(s): Secondary | ICD-10-CM

## 2020-04-18 DIAGNOSIS — M79674 Pain in right toe(s): Secondary | ICD-10-CM

## 2020-04-22 NOTE — Progress Notes (Signed)
Subjective: Mary Collier is a pleasant 69 y.o. female patient seen today painful porokeratotic lesion(s) b/l feet and painful mycotic toenails that limit ambulation. Aggravating factors include weightbearing with and without shoe gear. Pain for both is relieved with periodic professional debridement.   She is s/p matrixectomy right hallux and digit feels fine, but she does have pain of dorsomedial nailbed on occasion. Denies any redness, drainage or swelling from digit.  Past Medical History:  Diagnosis Date  . Allergic rhinitis   . Allergy   . Arthritis   . Asthma   . Blood transfusion without reported diagnosis    1966  . Cancer ([place])    at age 89.  abdominal  . Cancer of kidney ([place])    at age 82.  Right kidney.  . Chronic headache   . Edema   . GERD (gastroesophageal reflux disease)   . Glaucoma   . Hiatal hernia   . Hypertension   . IBS (irritable bowel syndrome)   . Internal hemorrhoid   . Lumbago   . Lump or mass in breast   . Lung cancer ([place])    at age 34  Right lung.  . Other abnormal blood chemistry   . Other and unspecified hyperlipidemia   . Other convulsions    hx seizures at age 57  . Peptic stricture of esophagus   . Reflux esophagitis   . Tubular adenoma of colon 2015  . Unspecified menopausal and postmenopausal disorder   . Unspecified vitamin D deficiency     Patient Active Problem List   Diagnosis Date Noted  . 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/14/2020  . Postmenopausal estrogen deficiency 06/15/2019  . GERD (gastroesophageal reflux disease) 07/12/2014  . Fibromyalgia 04/27/2013  . Special screening for malignant neoplasms, colon 03/30/2013  . Vitamin D deficiency 03/30/2013  . HTN (hypertension) 03/30/2013  . Hyperlipidemia 03/30/2013  . Other malaise and fatigue 03/30/2013  . Unspecified constipation 03/30/2013  . Routine general medical examination at a health care facility 03/30/2013  . Hyperglycemia 03/30/2013  . Pulmonary nodule  04/16/2011    Current Outpatient Medications on File Prior to Visit  Medication Sig Dispense Refill  . topiramate (TOPAMAX) 25 MG tablet TAKE 1 TABLET AT BEDTIME FOR A WEEK, THEN 2 TABLETS AT BEDTIME.    Marland Kitchen acetaminophen (TYLENOL) 325 MG tablet Take 325 mg by mouth as needed.      Marland Kitchen atorvastatin (LIPITOR) 10 MG tablet Take 1 tablet (10 mg total) by mouth daily. 90 tablet 3  . bimatoprost (LUMIGAN) 0.03 % ophthalmic solution Place 1 drop into both eyes at bedtime.      . calcium-vitamin D (OSCAL 500/200 D-3) 500-200 MG-UNIT per tablet Take 1 tablet by mouth 2 (two) times daily. 60 tablet 12  . CVS E 200 units capsule Take 200 Units by mouth daily.  0  . CVS SENNA PLUS 8.6-50 MG tablet TAKE 1 TABLET BY MOUTH DAILY. 90 tablet 1  . diclofenac sodium (VOLTAREN) 1 % GEL APPLY 2 G TOPICALLY 3 (THREE) TIMES DAILY AS NEEDED. 100 g 0  . dimenhyDRINATE (DRAMAMINE) 50 MG tablet Take 50 mg by mouth every 8 (eight) hours as needed for dizziness.    . fluticasone (FLONASE) 50 MCG/ACT nasal spray SPRAY 2 SPRAYS INTO EACH NOSTRIL EVERY DAY 48 mL 0  . folic acid (FOLVITE) 1 MG tablet Take 1 mg by mouth daily.    . hydrocortisone (ANUSOL-HC) 25 MG suppository PLACE 1 SUPPOSITORY PER RECTUM TWICE A DAY 28 suppository  2  . ipratropium (ATROVENT) 0.03 % nasal spray     . losartan (COZAAR) 25 MG tablet Take 1 tablet (25 mg total) by mouth daily. 90 tablet 3  . loteprednol (LOTEMAX) 0.5 % ophthalmic suspension 1 drop 2 (two) times daily.    Marland Kitchen LUMIGAN 0.01 % SOLN 1 drop at bedtime.    . metoprolol succinate (TOPROL-XL) 25 MG 24 hr tablet Take 1 tablet (25 mg total) by mouth daily. 90 tablet 3  . omeprazole (PRILOSEC) 40 MG capsule TAKE 1 CAPSULE BY MOUTH EVERY DAY 90 capsule 0  . ondansetron (ZOFRAN) 4 MG tablet Take 1 tablet (4 mg total) by mouth every 8 (eight) hours as needed for nausea or vomiting. 30 tablet 1  . OVER THE COUNTER MEDICATION Recicare cream--otc--TID for 10 days--then PRN--per GI doctor    .  Polyethyl Glycol-Propyl Glycol 0.4-0.3 % SOLN Apply to eye.    Marland Kitchen RESTASIS 0.05 % ophthalmic emulsion 1 drop 2 (two) times daily.    . timolol (TIMOPTIC) 0.5 % ophthalmic solution 1 drop every morning.    . Travoprost, BAK Free, (TRAVATAN) 0.004 % SOLN ophthalmic solution 1 drop at bedtime.    . triamcinolone (NASACORT ALLERGY 24HR) 55 MCG/ACT AERO nasal inhaler Place 2 sprays into the nose as needed.    . verapamil (CALAN-SR) 120 MG CR tablet Take 1 tablet (120 mg total) by mouth at bedtime. 30 tablet 3   No current facility-administered medications on file prior to visit.    Allergies  Allergen Reactions  . Influenza Vaccines     D/t being allergic to eggs  . Shrimp [Shellfish Allergy] Swelling    Lip, throat, tongue swelling  . Other Itching    Newspaper Dye  . Celecoxib     Able to take IBU  . Codeine Swelling  . Compazine [Prochlorperazine]   . Gabapentin Other (See Comments)    Dizziness and somnolence  . Lisinopril Cough    cough  . Peanut-Containing Drug Products     Itching  . Prednisone     Rapid heart rate  . Prochlorperazine Edisylate     paralyzed  . Robinul [Glycopyrrolate]     boils  . Vioxx [Rofecoxib]     States able to take IBU  . Aspirin Rash    States able to take IBU  . Eggs Or Egg-Derived Products Rash  . Epinephrine Rash  . Erythromycin Rash  . Sulfa Antibiotics Rash  . Tetracycline Rash    Objective: Physical Exam  General: Mary Collier is a pleasant 69 y.o. African American female, WD, WN in NAD. AAO x 3.   Vascular:  Capillary refill time to digits immediate b/l. Palpable pedal pulses b/l LE. Pedal hair sparse. Lower extremity skin temperature gradient within normal limits. No pain with calf compression b/l. No edema noted b/l lower extremities.  Dermatological:  Pedal skin with normal turgor, texture and tone bilaterally. No open wounds bilaterally. No interdigital macerations bilaterally. Toenails 2-5 bilaterally and L hallux  elongated, discolored, dystrophic, thickened, and crumbly with subungual debris and tenderness to dorsal palpation. Anonychia noted R hallux. Nailbed(s) epithelialized.  Hyperkeratotic lesion(s) R hallux.  No erythema, no edema, no drainage, no flocculence. Porokeratotic lesion(s) submet head 2 left foot, submet head 2 right foot, submet head 5 left foot and submet head 5 right foot. No erythema, no edema, no drainage, no flocculence.  Musculoskeletal:  Normal muscle strength 5/5 to all lower extremity muscle groups bilaterally. No pain crepitus or joint  limitation noted with ROM b/l. No gross bony deformities bilaterally.  Neurological:  Protective sensation intact 5/5 intact bilaterally with 10g monofilament b/l. Vibratory sensation intact b/l. Proprioception intact bilaterally.  Assessment and Plan:  1. Pain due to onychomycosis of toenails of both feet   2. Callus   3. Porokeratosis   4. Pain in both feet   5. Prediabetes    -Examined patient. -Toenails 2-5 bilaterally and L hallux debrided in length and girth without iatrogenic bleeding with sterile nail nipper and dremel.  -Callus(es) L hallux pared utilizing sterile scalpel blade without complication or incident. Total number debrided =1. -Painful porokeratotic lesion(s) submet head 2 left foot, submet head 2 right foot, submet head 5 left foot and submet head 5 right foot pared and enucleated with sterile scalpel blade without incident. Total number of lesions=5. -Patient to continue soft, supportive shoe gear daily. -Patient/POA to call should there be question/concern in the interim.  Return in about 9 weeks (around 06/20/2020) for nail and callus trim.  Marzetta Board, DPM

## 2020-04-30 DIAGNOSIS — R519 Headache, unspecified: Secondary | ICD-10-CM | POA: Diagnosis not present

## 2020-05-02 ENCOUNTER — Other Ambulatory Visit: Payer: Self-pay | Admitting: Nurse Practitioner

## 2020-05-02 DIAGNOSIS — K219 Gastro-esophageal reflux disease without esophagitis: Secondary | ICD-10-CM

## 2020-05-11 ENCOUNTER — Other Ambulatory Visit: Payer: Self-pay

## 2020-05-11 ENCOUNTER — Ambulatory Visit (INDEPENDENT_AMBULATORY_CARE_PROVIDER_SITE_OTHER): Payer: BC Managed Care – PPO | Admitting: Family Medicine

## 2020-05-11 ENCOUNTER — Telehealth: Payer: Self-pay | Admitting: Nurse Practitioner

## 2020-05-11 ENCOUNTER — Encounter: Payer: Self-pay | Admitting: Family Medicine

## 2020-05-11 VITALS — BP 124/76 | HR 62 | Temp 97.8°F | Ht 61.0 in | Wt 184.4 lb

## 2020-05-11 DIAGNOSIS — N898 Other specified noninflammatory disorders of vagina: Secondary | ICD-10-CM | POA: Diagnosis not present

## 2020-05-11 DIAGNOSIS — R300 Dysuria: Secondary | ICD-10-CM | POA: Diagnosis not present

## 2020-05-11 LAB — URINALYSIS, ROUTINE W REFLEX MICROSCOPIC
Bilirubin Urine: NEGATIVE
Hgb urine dipstick: NEGATIVE
Ketones, ur: NEGATIVE
Leukocytes,Ua: NEGATIVE
Nitrite: NEGATIVE
Specific Gravity, Urine: 1.02 (ref 1.000–1.030)
Total Protein, Urine: NEGATIVE
Urine Glucose: NEGATIVE
Urobilinogen, UA: 0.2 (ref 0.0–1.0)
pH: 5.5 (ref 5.0–8.0)

## 2020-05-11 LAB — POCT URINALYSIS DIPSTICK OB
Bilirubin, UA: NEGATIVE
Blood, UA: NEGATIVE
Glucose, UA: NEGATIVE
Ketones, UA: NEGATIVE
Leukocytes, UA: NEGATIVE
Nitrite, UA: NEGATIVE
POC,PROTEIN,UA: NEGATIVE
Spec Grav, UA: 1.025 (ref 1.010–1.025)
Urobilinogen, UA: 0.2 E.U./dL
pH, UA: 6 (ref 5.0–8.0)

## 2020-05-11 MED ORDER — FLUCONAZOLE 150 MG PO TABS: 150.0000 mg | ORAL_TABLET | Freq: Once | ORAL | 0 refills | Status: AC

## 2020-05-11 NOTE — Telephone Encounter (Signed)
Ok to write letter excusing her from work?

## 2020-05-11 NOTE — Telephone Encounter (Signed)
Okay 

## 2020-05-11 NOTE — Progress Notes (Signed)
Established Patient Office Visit  Subjective:  Patient ID: Mary Collier, female    DOB: 01/25/1951  Age: 69 y.o. MRN: [id number]  CC:  Chief Complaint  Patient presents with  . Dysuria    C/O fatigue, dysuria, back pains, itchy vagina x 5 days also no appetite     HPI Mary Collier presents for evaluation of a 5-day history of dysuria described as burning in the vaginal area and with urination.  There is vaginal pruritus.  Denies discharge.  Not active sexually for years.  There is some lower back pain.  Appetite is decreased but there is no nausea or vomiting.  No fever or chills.  Denies increased frequency or urgency.  Past Medical History:  Diagnosis Date  . Allergic rhinitis   . Allergy   . Arthritis   . Asthma   . Blood transfusion without reported diagnosis    1966  . Cancer ([place])    at age 60.  abdominal  . Cancer of kidney ([place])    at age 70.  Right kidney.  . Chronic headache   . Edema   . GERD (gastroesophageal reflux disease)   . Glaucoma   . Hiatal hernia   . Hypertension   . IBS (irritable bowel syndrome)   . Internal hemorrhoid   . Lumbago   . Lump or mass in breast   . Lung cancer ([place])    at age 70  Right lung.  . Other abnormal blood chemistry   . Other and unspecified hyperlipidemia   . Other convulsions    hx seizures at age 83  . Peptic stricture of esophagus   . Reflux esophagitis   . Tubular adenoma of colon 2015  . Unspecified menopausal and postmenopausal disorder   . Unspecified vitamin D deficiency     Past Surgical History:  Procedure Laterality Date  . ABDOMINAL SURGERY  at age 3   . BREAST SURGERY  1970s and 1980s   Bilateral cysts removed  . KIDNEY SURGERY  at age 75   Right  . LUNG SURGERY  at age 16   Right  . NASAL SINUS SURGERY  1980s  . TONSILLECTOMY  1970  . TOTAL ABDOMINAL HYSTERECTOMY  2000s    Family History  Problem Relation Age of Onset  . Allergies Sister        x3  . Asthma Sister   . Bone cancer  Father   . Lung cancer Father   . Stomach cancer Father   . Esophageal cancer Father   . Breast cancer Mother   . Prostate cancer Brother   . Pancreatic cancer Brother        mets from prostate  . Colon cancer Brother   . Hypertension Brother   . Prostate cancer Brother   . Bone cancer Brother   . Diabetes Brother   . Heart disease Sister   . Diabetes Sister   . Hypertension Sister   . Breast cancer Sister        mastectomy  . Stroke Sister   . Rectal cancer Neg Hx   . Liver cancer Neg Hx     Social History   Socioeconomic History  . Marital status: Married    Spouse name: Not on file  . Number of children: 0  . Years of education: Not on file  . Highest education level: Not on file  Occupational History  . Occupation: Investment banker, corporate (preschool)   Tobacco Use  .  Smoking status: Never Smoker  . Smokeless tobacco: Never Used  Vaping Use  . Vaping Use: Never used  Substance and Sexual Activity  . Alcohol use: No  . Drug use: No  . Sexual activity: Not on file  Other Topics Concern  . Not on file  Social History Narrative   Pt has 18 siblings.   No caffeine drinks    Right handed   One story home   Social Determinants of Health   Financial Resource Strain:   . Difficulty of Paying Living Expenses:   Food Insecurity:   . Worried About Charity fundraiser in the Last Year:   . Arboriculturist in the Last Year:   Transportation Needs:   . Film/video editor (Medical):   Marland Kitchen Lack of Transportation (Non-Medical):   Physical Activity:   . Days of Exercise per Week:   . Minutes of Exercise per Session:   Stress:   . Feeling of Stress :   Social Connections:   . Frequency of Communication with Friends and Family:   . Frequency of Social Gatherings with Friends and Family:   . Attends Religious Services:   . Active Member of Clubs or Organizations:   . Attends Archivist Meetings:   Marland Kitchen Marital Status:   Intimate Partner Violence:   . Fear  of Current or Ex-Partner:   . Emotionally Abused:   Marland Kitchen Physically Abused:   . Sexually Abused:     Outpatient Medications Prior to Visit  Medication Sig Dispense Refill  . acetaminophen (TYLENOL) 325 MG tablet Take 325 mg by mouth as needed.      Marland Kitchen atorvastatin (LIPITOR) 10 MG tablet Take 1 tablet (10 mg total) by mouth daily. 90 tablet 3  . bimatoprost (LUMIGAN) 0.03 % ophthalmic solution Place 1 drop into both eyes at bedtime.      . calcium-vitamin D (OSCAL 500/200 D-3) 500-200 MG-UNIT per tablet Take 1 tablet by mouth 2 (two) times daily. 60 tablet 12  . CVS E 200 units capsule Take 200 Units by mouth daily.  0  . CVS SENNA PLUS 8.6-50 MG tablet TAKE 1 TABLET BY MOUTH DAILY. 90 tablet 1  . diclofenac sodium (VOLTAREN) 1 % GEL APPLY 2 G TOPICALLY 3 (THREE) TIMES DAILY AS NEEDED. 100 g 0  . dimenhyDRINATE (DRAMAMINE) 50 MG tablet Take 50 mg by mouth every 8 (eight) hours as needed for dizziness.    . fluticasone (FLONASE) 50 MCG/ACT nasal spray SPRAY 2 SPRAYS INTO EACH NOSTRIL EVERY DAY 48 mL 0  . folic acid (FOLVITE) 1 MG tablet Take 1 mg by mouth daily.    . hydrocortisone (ANUSOL-HC) 25 MG suppository PLACE 1 SUPPOSITORY PER RECTUM TWICE A DAY 28 suppository 2  . losartan (COZAAR) 25 MG tablet Take 1 tablet (25 mg total) by mouth daily. 90 tablet 3  . loteprednol (LOTEMAX) 0.5 % ophthalmic suspension 1 drop 2 (two) times daily.    Marland Kitchen LUMIGAN 0.01 % SOLN 1 drop at bedtime.    . metoprolol succinate (TOPROL-XL) 25 MG 24 hr tablet Take 1 tablet (25 mg total) by mouth daily. 90 tablet 3  . omeprazole (PRILOSEC) 40 MG capsule TAKE 1 CAPSULE BY MOUTH EVERY DAY 90 capsule 0  . ondansetron (ZOFRAN) 4 MG tablet Take 1 tablet (4 mg total) by mouth every 8 (eight) hours as needed for nausea or vomiting. 30 tablet 1  . RESTASIS 0.05 % ophthalmic emulsion 1 drop 2 (two) times  daily.    . timolol (TIMOPTIC) 0.5 % ophthalmic solution 1 drop every morning.    . topiramate (TOPAMAX) 25 MG tablet TAKE 1  TABLET AT BEDTIME FOR A WEEK, THEN 2 TABLETS AT BEDTIME.    Marland Kitchen ipratropium (ATROVENT) 0.03 % nasal spray  (Patient not taking: Reported on 05/11/2020)    . OVER THE COUNTER MEDICATION Recicare cream--otc--TID for 10 days--then PRN--per GI doctor (Patient not taking: Reported on 05/11/2020)    . Polyethyl Glycol-Propyl Glycol 0.4-0.3 % SOLN Apply to eye. (Patient not taking: Reported on 05/11/2020)    . Travoprost, BAK Free, (TRAVATAN) 0.004 % SOLN ophthalmic solution 1 drop at bedtime. (Patient not taking: Reported on 05/11/2020)    . triamcinolone (NASACORT ALLERGY 24HR) 55 MCG/ACT AERO nasal inhaler Place 2 sprays into the nose as needed. (Patient not taking: Reported on 05/11/2020)    . verapamil (CALAN-SR) 120 MG CR tablet Take 1 tablet (120 mg total) by mouth at bedtime. (Patient not taking: Reported on 05/11/2020) 30 tablet 3   No facility-administered medications prior to visit.    Allergies  Allergen Reactions  . Influenza Vaccines     D/t being allergic to eggs  . Shrimp [Shellfish Allergy] Swelling    Lip, throat, tongue swelling  . Other Itching    Newspaper Dye  . Celecoxib     Able to take IBU  . Codeine Swelling  . Compazine [Prochlorperazine]   . Gabapentin Other (See Comments)    Dizziness and somnolence  . Lisinopril Cough    cough  . Peanut-Containing Drug Products     Itching  . Prednisone     Rapid heart rate  . Prochlorperazine Edisylate     paralyzed  . Robinul [Glycopyrrolate]     boils  . Vioxx [Rofecoxib]     States able to take IBU  . Aspirin Rash    States able to take IBU  . Eggs Or Egg-Derived Products Rash  . Epinephrine Rash  . Erythromycin Rash  . Sulfa Antibiotics Rash  . Tetracycline Rash    ROS Review of Systems  Constitutional: Negative for chills, diaphoresis, fatigue, fever and unexpected weight change.  Respiratory: Negative.   Cardiovascular: Negative.   Gastrointestinal: Negative.  Negative for nausea and vomiting.  Endocrine:  Negative for polyphagia and polyuria.  Genitourinary: Positive for dysuria. Negative for decreased urine volume, difficulty urinating, flank pain, frequency, hematuria, urgency and vaginal discharge.  Musculoskeletal: Positive for back pain.  Allergic/Immunologic: Negative for immunocompromised state.  Neurological: Negative for weakness.  Hematological: Does not bruise/bleed easily.  Psychiatric/Behavioral: Negative.       Objective:    Physical Exam Nursing note reviewed.  Constitutional:      General: She is not in acute distress.    Appearance: Normal appearance. She is not ill-appearing, toxic-appearing or diaphoretic.  HENT:     Head: Normocephalic and atraumatic.     Right Ear: External ear normal.     Left Ear: External ear normal.  Eyes:     General: No scleral icterus.       Right eye: No discharge.        Left eye: No discharge.     Extraocular Movements: Extraocular movements intact.     Conjunctiva/sclera: Conjunctivae normal.     Pupils: Pupils are equal, round, and reactive to light.  Cardiovascular:     Rate and Rhythm: Normal rate and regular rhythm.  Pulmonary:     Effort: Pulmonary effort is normal.  Breath sounds: Normal breath sounds.  Abdominal:     General: Bowel sounds are normal.     Tenderness: There is no right CVA tenderness or left CVA tenderness.  Skin:    General: Skin is warm and dry.  Neurological:     Mental Status: She is alert and oriented to person, place, and time.  Psychiatric:        Mood and Affect: Mood normal.        Behavior: Behavior normal.     BP 124/76   Pulse 62   Temp 97.8 F (36.6 C) (Tympanic)   Ht 5\' 1"  (1.549 m)   Wt 184 lb 6.4 oz (83.6 kg)   SpO2 99%   BMI 34.84 kg/m  Wt Readings from Last 3 Encounters:  05/11/20 184 lb 6.4 oz (83.6 kg)  03/21/20 185 lb 6.4 oz (84.1 kg)  02/14/20 186 lb 12.8 oz (84.7 kg)     There are no preventive care reminders to display for this patient.  There are no  preventive care reminders to display for this patient.  Lab Results  Component Value Date   TSH 2.34 02/14/2020   Lab Results  Component Value Date   WBC 7.8 10/12/2019   HGB 12.1 10/12/2019   HCT 37.0 10/12/2019   MCV 89.3 10/12/2019   PLT 213.0 10/12/2019   Lab Results  Component Value Date   NA 140 02/14/2020   K 4.8 02/14/2020   CO2 30 02/14/2020   GLUCOSE 92 02/14/2020   BUN 19 02/14/2020   CREATININE 0.97 02/14/2020   BILITOT 0.5 06/14/2019   ALKPHOS 53 06/14/2019   AST 15 06/14/2019   ALT 6 06/14/2019   PROT 7.2 06/14/2019   ALBUMIN 4.2 06/14/2019   CALCIUM 9.6 02/14/2020   GFR 69.05 02/14/2020   Lab Results  Component Value Date   CHOL 140 02/14/2020   Lab Results  Component Value Date   HDL 33.70 (L) 02/14/2020   Lab Results  Component Value Date   LDLCALC 79 02/14/2020   Lab Results  Component Value Date   TRIG 138.0 02/14/2020   Lab Results  Component Value Date   CHOLHDL 4 02/14/2020   Lab Results  Component Value Date   HGBA1C 6.2 02/14/2020      Assessment & Plan:   Problem List Items Addressed This Visit      Other   Dysuria - Primary   Relevant Medications   fluconazole (DIFLUCAN) 150 MG tablet   Other Relevant Orders   POC Urinalysis Dipstick OB   Urinalysis, Routine w reflex microscopic   Urine Culture    Other Visit Diagnoses    Vaginal pruritus       Relevant Medications   fluconazole (DIFLUCAN) 150 MG tablet      Meds ordered this encounter  Medications  . fluconazole (DIFLUCAN) 150 MG tablet    Sig: Take 1 tablet (150 mg total) by mouth once for 1 dose. Repeat once in one week.    Dispense:  2 tablet    Refill:  0    Follow-up: Return follow up with [facility].Libby Maw, MD

## 2020-05-11 NOTE — Telephone Encounter (Signed)
Pt needs a note to return to work on Monday 05/14/20, and she needs for it to be for this entire week since she was out please advise, She said you can just put it in [place]

## 2020-05-12 LAB — URINE CULTURE
MICRO NUMBER:: 10742744
SPECIMEN QUALITY:: ADEQUATE

## 2020-05-14 NOTE — Telephone Encounter (Signed)
Letter has been sent to mychart.

## 2020-05-18 ENCOUNTER — Ambulatory Visit: Payer: BC Managed Care – PPO | Admitting: Nurse Practitioner

## 2020-05-22 ENCOUNTER — Telehealth: Payer: BC Managed Care – PPO | Admitting: Nurse Practitioner

## 2020-05-22 ENCOUNTER — Other Ambulatory Visit: Payer: Self-pay

## 2020-05-22 ENCOUNTER — Other Ambulatory Visit: Payer: BC Managed Care – PPO

## 2020-05-22 DIAGNOSIS — Z20822 Contact with and (suspected) exposure to covid-19: Secondary | ICD-10-CM

## 2020-05-23 ENCOUNTER — Ambulatory Visit: Payer: BC Managed Care – PPO | Admitting: Nurse Practitioner

## 2020-05-23 LAB — SARS-COV-2, NAA 2 DAY TAT

## 2020-05-23 LAB — NOVEL CORONAVIRUS, NAA: SARS-CoV-2, NAA: NOT DETECTED

## 2020-07-30 ENCOUNTER — Encounter: Payer: Self-pay | Admitting: Podiatry

## 2020-07-30 ENCOUNTER — Other Ambulatory Visit: Payer: Self-pay

## 2020-07-30 ENCOUNTER — Ambulatory Visit (INDEPENDENT_AMBULATORY_CARE_PROVIDER_SITE_OTHER): Payer: PRIVATE HEALTH INSURANCE | Admitting: Podiatry

## 2020-07-30 DIAGNOSIS — M79674 Pain in right toe(s): Secondary | ICD-10-CM

## 2020-07-30 DIAGNOSIS — L84 Corns and callosities: Secondary | ICD-10-CM

## 2020-07-30 DIAGNOSIS — Z9889 Other specified postprocedural states: Secondary | ICD-10-CM

## 2020-07-30 DIAGNOSIS — M79672 Pain in left foot: Secondary | ICD-10-CM

## 2020-07-30 DIAGNOSIS — M79671 Pain in right foot: Secondary | ICD-10-CM

## 2020-07-30 DIAGNOSIS — Q828 Other specified congenital malformations of skin: Secondary | ICD-10-CM

## 2020-07-30 DIAGNOSIS — M79675 Pain in left toe(s): Secondary | ICD-10-CM

## 2020-07-30 DIAGNOSIS — B351 Tinea unguium: Secondary | ICD-10-CM

## 2020-08-01 NOTE — Progress Notes (Signed)
NEUROLOGY FOLLOW UP OFFICE NOTE  Mary Collier [id number]  HISTORY OF PRESENT ILLNESS: Mary Collier is a 69year old female hypertension and GERD who follows up for chronic paroxysmal hemicrania.  UPDATE: She saw [facility] in June.   Neuro-ophthalmic exam was normal but noted to have significant corneal dryness which could explain her decreased visual acuity.  An ultrasound of the temporal arteries was performed and was negative.  Sometimes she has a twitching in her right eye and has trouble opening it.  Not necessarily associated with headache.    For headaches, she was started on verapamil CR, however it made her dizzy so she stopped taking it.  Headaches are daily.  Takes Tylenol once a day and rests in bed in a dark and quiet room.  She has relief for about an hour.    HISTORY: She has remote history of headaches and generalized tonic clonic seizures as a teenager, which subsequently resolved.  Around 2010, she developedepisodes ofsharp right sided retro-orbital pain, 10/10, lasting 3 to 10 minutes. There is associated conjunctival injection and watery eye but no ptosis or nasal congestion.There is no associated symptoms such as visual disturbance (specific to these attacks), unilateral facial numbness/weakness, nausea, vomiting, dizziness or focal unilateral weakness or numbness of the body. Theyinitiallyoccurred once a day but then became several times dailyin 2018. There are no triggers. Laying down and applying warm compresses helps. She takes Tylenol. She was previously treated at the [facility] and was diagnosed with ocular migraines.She also has sensation of water in back of the right occipital region. She received occipital nerve blocks years ago, which was effective.   Around 2015, she developed gradual onset of vision loss and visual disturbance. People and objects look like shadows and she sees double or triple. When she reads, she  sees several copies of the words stacked on top of each other. She also sees floaters. Occasionally, she sees a small yellow dot in the vision of her left eye that is intermittent, lasting just seconds. She has been followed by [facility] for several years for glaucoma. Due to persistent visual disturbance, she was evaluated by Dr. Tempie Hoist, a retinal specialist, on 10/23/16. On Humphrey visual field testing, she exhibited severe constriction bilaterally. Retinal exam revealed grade II hypertensive retinopathy with minimal vitreous opacities. It was suggested that her visual field defect may bemigraine-related phenomena.I last saw her in 2018. Sed rate and CRP from 01/01/2017 were 13 and 0.1 respectively. MRI of brain with and without contrast on 01/21/2017 was personally reviewed and was unremarkable. She saw neuro-ophthalmologist Dr. Jolyn Nap later in 2018, who was unable to make any definitive diagnosis.   She continues to havethe persistent vision loss. Labs from 10/12/2019 showed sed rate 31 and CRP <1.0. Her ophthalmologist is Dr. Marshall Cork at [facility].  There is no family history of headaches or cerebral aneurysms.  Past medications:  Gabapentin (dizziness), topiramate (dizziness)  PAST MEDICAL HISTORY: Past Medical History:  Diagnosis Date  . Allergic rhinitis   . Allergy   . Arthritis   . Asthma   . Blood transfusion without reported diagnosis    1966  . Cancer ([place])    at age 14.  abdominal  . Cancer of kidney ([place])    at age 26.  Right kidney.  . Chronic headache   . Edema   . GERD (gastroesophageal reflux disease)   . Glaucoma   . Hiatal hernia   . Hypertension   .  IBS (irritable bowel syndrome)   . Internal hemorrhoid   . Lumbago   . Lump or mass in breast   . Lung cancer ([place])    at age 2  Right lung.  . Other abnormal blood chemistry   . Other and unspecified hyperlipidemia   . Other convulsions    hx seizures  at age 8  . Peptic stricture of esophagus   . Reflux esophagitis   . Tubular adenoma of colon 2015  . Unspecified menopausal and postmenopausal disorder   . Unspecified vitamin D deficiency     MEDICATIONS: Current Outpatient Medications on File Prior to Visit  Medication Sig Dispense Refill  . acetaminophen (TYLENOL) 325 MG tablet Take 325 mg by mouth as needed.      Marland Kitchen atorvastatin (LIPITOR) 10 MG tablet Take 1 tablet (10 mg total) by mouth daily. 90 tablet 3  . bimatoprost (LUMIGAN) 0.03 % ophthalmic solution Place 1 drop into both eyes at bedtime.      . calcium-vitamin D (OSCAL 500/200 D-3) 500-200 MG-UNIT per tablet Take 1 tablet by mouth 2 (two) times daily. 60 tablet 12  . CVS E 200 units capsule Take 200 Units by mouth daily.  0  . CVS SENNA PLUS 8.6-50 MG tablet TAKE 1 TABLET BY MOUTH DAILY. 90 tablet 1  . diclofenac sodium (VOLTAREN) 1 % GEL APPLY 2 G TOPICALLY 3 (THREE) TIMES DAILY AS NEEDED. 100 g 0  . dimenhyDRINATE (DRAMAMINE) 50 MG tablet Take 50 mg by mouth every 8 (eight) hours as needed for dizziness.    . fluticasone (FLONASE) 50 MCG/ACT nasal spray SPRAY 2 SPRAYS INTO EACH NOSTRIL EVERY DAY 48 mL 0  . folic acid (FOLVITE) 1 MG tablet Take 1 mg by mouth daily.    . hydrocortisone (ANUSOL-HC) 25 MG suppository PLACE 1 SUPPOSITORY PER RECTUM TWICE A DAY 28 suppository 2  . ipratropium (ATROVENT) 0.03 % nasal spray  (Patient not taking: Reported on 05/11/2020)    . losartan (COZAAR) 25 MG tablet Take 1 tablet (25 mg total) by mouth daily. 90 tablet 3  . loteprednol (LOTEMAX) 0.5 % ophthalmic suspension 1 drop 2 (two) times daily.    Marland Kitchen LUMIGAN 0.01 % SOLN 1 drop at bedtime.    . metoprolol succinate (TOPROL-XL) 25 MG 24 hr tablet Take 1 tablet (25 mg total) by mouth daily. 90 tablet 3  . omeprazole (PRILOSEC) 40 MG capsule TAKE 1 CAPSULE BY MOUTH EVERY DAY 90 capsule 0  . ondansetron (ZOFRAN) 4 MG tablet Take 1 tablet (4 mg total) by mouth every 8 (eight) hours as needed  for nausea or vomiting. 30 tablet 1  . OVER THE COUNTER MEDICATION Recicare cream--otc--TID for 10 days--then PRN--per GI doctor (Patient not taking: Reported on 05/11/2020)    . Polyethyl Glycol-Propyl Glycol 0.4-0.3 % SOLN Apply to eye. (Patient not taking: Reported on 05/11/2020)    . RESTASIS 0.05 % ophthalmic emulsion 1 drop 2 (two) times daily.    . timolol (TIMOPTIC) 0.5 % ophthalmic solution 1 drop every morning.    . topiramate (TOPAMAX) 25 MG tablet TAKE 1 TABLET AT BEDTIME FOR A WEEK, THEN 2 TABLETS AT BEDTIME.    . Travoprost, BAK Free, (TRAVATAN) 0.004 % SOLN ophthalmic solution 1 drop at bedtime. (Patient not taking: Reported on 05/11/2020)    . triamcinolone (NASACORT ALLERGY 24HR) 55 MCG/ACT AERO nasal inhaler Place 2 sprays into the nose as needed. (Patient not taking: Reported on 05/11/2020)    . verapamil (CALAN-SR)  120 MG CR tablet Take 1 tablet (120 mg total) by mouth at bedtime. (Patient not taking: Reported on 05/11/2020) 30 tablet 3   No current facility-administered medications on file prior to visit.    ALLERGIES: Allergies  Allergen Reactions  . Influenza Vaccines     D/t being allergic to eggs  . Shrimp [Shellfish Allergy] Swelling    Lip, throat, tongue swelling  . Other Itching    Newspaper Dye  . Celecoxib     Able to take IBU  . Codeine Swelling  . Compazine [Prochlorperazine]   . Gabapentin Other (See Comments)    Dizziness and somnolence  . Lisinopril Cough    cough  . Peanut-Containing Drug Products     Itching  . Prednisone     Rapid heart rate  . Prochlorperazine Edisylate     paralyzed  . Robinul [Glycopyrrolate]     boils  . Vioxx [Rofecoxib]     States able to take IBU  . Aspirin Rash    States able to take IBU  . Eggs Or Egg-Derived Products Rash  . Epinephrine Rash  . Erythromycin Rash  . Sulfa Antibiotics Rash  . Tetracycline Rash    FAMILY HISTORY: Family History  Problem Relation Age of Onset  . Allergies Sister        x3    . Asthma Sister   . Bone cancer Father   . Lung cancer Father   . Stomach cancer Father   . Esophageal cancer Father   . Breast cancer Mother   . Prostate cancer Brother   . Pancreatic cancer Brother        mets from prostate  . Colon cancer Brother   . Hypertension Brother   . Prostate cancer Brother   . Bone cancer Brother   . Diabetes Brother   . Heart disease Sister   . Diabetes Sister   . Hypertension Sister   . Breast cancer Sister        mastectomy  . Stroke Sister   . Rectal cancer Neg Hx   . Liver cancer Neg Hx     SOCIAL HISTORY: Social History   Socioeconomic History  . Marital status: Married    Spouse name: Not on file  . Number of children: 0  . Years of education: Not on file  . Highest education level: Not on file  Occupational History  . Occupation: Investment banker, corporate (preschool)   Tobacco Use  . Smoking status: Never Smoker  . Smokeless tobacco: Never Used  Vaping Use  . Vaping Use: Never used  Substance and Sexual Activity  . Alcohol use: No  . Drug use: No  . Sexual activity: Not on file  Other Topics Concern  . Not on file  Social History Narrative   Pt has 18 siblings.   No caffeine drinks    Right handed   One story home   Social Determinants of Health   Financial Resource Strain:   . Difficulty of Paying Living Expenses: Not on file  Food Insecurity:   . Worried About Charity fundraiser in the Last Year: Not on file  . Ran Out of Food in the Last Year: Not on file  Transportation Needs:   . Lack of Transportation (Medical): Not on file  . Lack of Transportation (Non-Medical): Not on file  Physical Activity:   . Days of Exercise per Week: Not on file  . Minutes of Exercise per Session: Not on file  Stress:   . Feeling of Stress : Not on file  Social Connections:   . Frequency of Communication with Friends and Family: Not on file  . Frequency of Social Gatherings with Friends and Family: Not on file  . Attends Religious  Services: Not on file  . Active Member of Clubs or Organizations: Not on file  . Attends Archivist Meetings: Not on file  . Marital Status: Not on file  Intimate Partner Violence:   . Fear of Current or Ex-Partner: Not on file  . Emotionally Abused: Not on file  . Physically Abused: Not on file  . Sexually Abused: Not on file   PHYSICAL EXAM: Blood pressure (!) 169/91, pulse 76, height 5\' 1"  (1.549 m), weight 187 lb (84.8 kg), SpO2 99 %. General: No acute distress.  Patient appears well-groomed.    IMPRESSION: 1.  Possible chronic paroxysmal hemicrania vs chronic migraine 2.  Right sided occipital neuralgia  PLAN: 1.  Will start nortriptyline 10mg  at bedtime.  We can increase to 25mg  at bedtime in 6 weeks 2.  Will see if we can get prior authorization for occipital nerve block which can be helpful for occipital neuralgia, chronic paroxysmal hemicrania and has previously been effective. 3.  Limit use of pain relievers to no more than 2 days out of week to prevent risk of rebound or medication-overuse headache. 4.  Follow up with PCP regarding blood pressure 5.  Follow up in 4 to 6 months.  Metta Clines, DO  CC: Wilfred Lacy, NP

## 2020-08-01 NOTE — Progress Notes (Signed)
Subjective: Mary Collier is a pleasant 69 y.o. female patient seen today painful porokeratotic lesion(s) b/l feet and painful mycotic toenails that limit ambulation. Aggravating factors include weightbearing with and without shoe gear. Pain for both is relieved with periodic professional debridement.   She is s/p matrixectomy right hallux. She is pleased with outcome and would like to have the same procedure performed on her left hallux as soon as she can.  She relates some hard skin on the dorsal aspect of the right hallux. She voices no other pedal concerns on today's visit.  Past Medical History:  Diagnosis Date  . Allergic rhinitis   . Allergy   . Arthritis   . Asthma   . Blood transfusion without reported diagnosis    1966  . Cancer ([place])    at age 60.  abdominal  . Cancer of kidney ([place])    at age 70.  Right kidney.  . Chronic headache   . Edema   . GERD (gastroesophageal reflux disease)   . Glaucoma   . Hiatal hernia   . Hypertension   . IBS (irritable bowel syndrome)   . Internal hemorrhoid   . Lumbago   . Lump or mass in breast   . Lung cancer ([place])    at age 52  Right lung.  . Other abnormal blood chemistry   . Other and unspecified hyperlipidemia   . Other convulsions    hx seizures at age 6  . Peptic stricture of esophagus   . Reflux esophagitis   . Tubular adenoma of colon 2015  . Unspecified menopausal and postmenopausal disorder   . Unspecified vitamin D deficiency     Patient Active Problem List   Diagnosis Date Noted  . Dysuria 05/11/2020  . 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/14/2020  . Postmenopausal estrogen deficiency 06/15/2019  . GERD (gastroesophageal reflux disease) 07/12/2014  . Fibromyalgia 04/27/2013  . Special screening for malignant neoplasms, colon 03/30/2013  . Vitamin D deficiency 03/30/2013  . HTN (hypertension) 03/30/2013  . Hyperlipidemia 03/30/2013  . Other malaise and fatigue 03/30/2013  . Unspecified constipation 03/30/2013   . Routine general medical examination at a health care facility 03/30/2013  . Hyperglycemia 03/30/2013  . Pulmonary nodule 04/16/2011    Current Outpatient Medications on File Prior to Visit  Medication Sig Dispense Refill  . acetaminophen (TYLENOL) 325 MG tablet Take 325 mg by mouth as needed.      Marland Kitchen atorvastatin (LIPITOR) 10 MG tablet Take 1 tablet (10 mg total) by mouth daily. 90 tablet 3  . bimatoprost (LUMIGAN) 0.03 % ophthalmic solution Place 1 drop into both eyes at bedtime.      . calcium-vitamin D (OSCAL 500/200 D-3) 500-200 MG-UNIT per tablet Take 1 tablet by mouth 2 (two) times daily. 60 tablet 12  . CVS E 200 units capsule Take 200 Units by mouth daily.  0  . CVS SENNA PLUS 8.6-50 MG tablet TAKE 1 TABLET BY MOUTH DAILY. 90 tablet 1  . diclofenac sodium (VOLTAREN) 1 % GEL APPLY 2 G TOPICALLY 3 (THREE) TIMES DAILY AS NEEDED. 100 g 0  . dimenhyDRINATE (DRAMAMINE) 50 MG tablet Take 50 mg by mouth every 8 (eight) hours as needed for dizziness.    . fluticasone (FLONASE) 50 MCG/ACT nasal spray SPRAY 2 SPRAYS INTO EACH NOSTRIL EVERY DAY 48 mL 0  . folic acid (FOLVITE) 1 MG tablet Take 1 mg by mouth daily.    . hydrocortisone (ANUSOL-HC) 25 MG suppository PLACE 1 SUPPOSITORY PER  RECTUM TWICE A DAY 28 suppository 2  . ipratropium (ATROVENT) 0.03 % nasal spray  (Patient not taking: Reported on 05/11/2020)    . losartan (COZAAR) 25 MG tablet Take 1 tablet (25 mg total) by mouth daily. 90 tablet 3  . loteprednol (LOTEMAX) 0.5 % ophthalmic suspension 1 drop 2 (two) times daily.    Marland Kitchen LUMIGAN 0.01 % SOLN 1 drop at bedtime.    . metoprolol succinate (TOPROL-XL) 25 MG 24 hr tablet Take 1 tablet (25 mg total) by mouth daily. 90 tablet 3  . omeprazole (PRILOSEC) 40 MG capsule TAKE 1 CAPSULE BY MOUTH EVERY DAY 90 capsule 0  . ondansetron (ZOFRAN) 4 MG tablet Take 1 tablet (4 mg total) by mouth every 8 (eight) hours as needed for nausea or vomiting. 30 tablet 1  . OVER THE COUNTER MEDICATION  Recicare cream--otc--TID for 10 days--then PRN--per GI doctor (Patient not taking: Reported on 05/11/2020)    . Polyethyl Glycol-Propyl Glycol 0.4-0.3 % SOLN Apply to eye. (Patient not taking: Reported on 05/11/2020)    . RESTASIS 0.05 % ophthalmic emulsion 1 drop 2 (two) times daily.    . timolol (TIMOPTIC) 0.5 % ophthalmic solution 1 drop every morning.    . topiramate (TOPAMAX) 25 MG tablet TAKE 1 TABLET AT BEDTIME FOR A WEEK, THEN 2 TABLETS AT BEDTIME.    . Travoprost, BAK Free, (TRAVATAN) 0.004 % SOLN ophthalmic solution 1 drop at bedtime. (Patient not taking: Reported on 05/11/2020)    . triamcinolone (NASACORT ALLERGY 24HR) 55 MCG/ACT AERO nasal inhaler Place 2 sprays into the nose as needed. (Patient not taking: Reported on 05/11/2020)    . verapamil (CALAN-SR) 120 MG CR tablet Take 1 tablet (120 mg total) by mouth at bedtime. (Patient not taking: Reported on 05/11/2020) 30 tablet 3   No current facility-administered medications on file prior to visit.    Allergies  Allergen Reactions  . Influenza Vaccines     D/t being allergic to eggs  . Shrimp [Shellfish Allergy] Swelling    Lip, throat, tongue swelling  . Other Itching    Newspaper Dye  . Celecoxib     Able to take IBU  . Codeine Swelling  . Compazine [Prochlorperazine]   . Gabapentin Other (See Comments)    Dizziness and somnolence  . Lisinopril Cough    cough  . Peanut-Containing Drug Products     Itching  . Prednisone     Rapid heart rate  . Prochlorperazine Edisylate     paralyzed  . Robinul [Glycopyrrolate]     boils  . Vioxx [Rofecoxib]     States able to take IBU  . Aspirin Rash    States able to take IBU  . Eggs Or Egg-Derived Products Rash  . Epinephrine Rash  . Erythromycin Rash  . Sulfa Antibiotics Rash  . Tetracycline Rash    Objective: Physical Exam  General: Mary Collier is a pleasant 69 y.o. African American female, WD, WN in NAD. AAO x 3.   Vascular:  Capillary refill time to digits  immediate b/l. Palpable pedal pulses b/l LE. Pedal hair sparse. Lower extremity skin temperature gradient within normal limits. No pain with calf compression b/l. No edema noted b/l lower extremities.  Dermatological:  Pedal skin with normal turgor, texture and tone bilaterally. No open wounds bilaterally. No interdigital macerations bilaterally. Toenails 2-5 bilaterally and L hallux elongated, discolored, dystrophic, thickened, and crumbly with subungual debris and tenderness to dorsal palpation. Anonychia noted R hallux. Nailbed(s) epithelialized.  Hyperkeratotic lesion(s) R hallux.  No erythema, no edema, no drainage, no flocculence. Porokeratotic lesion(s) submet head 2 left foot, submet head 2 right foot, submet head 5 left foot and submet head 5 right foot. No erythema, no edema, no drainage, no flocculence.  Musculoskeletal:  Normal muscle strength 5/5 to all lower extremity muscle groups bilaterally. No pain crepitus or joint limitation noted with ROM b/l. No gross bony deformities bilaterally.  Neurological:  Protective sensation intact 5/5 intact bilaterally with 10g monofilament b/l. Vibratory sensation intact b/l. Proprioception intact bilaterally.  Assessment and Plan:  1. Pain due to onychomycosis of toenails of both feet   2. Callus   3. Porokeratosis   4. S/P matrixectomy of toe   5. Pain in both feet     -Examined patient. -Toenails 2-5 bilaterally and L hallux debrided in length and girth without iatrogenic bleeding with sterile nail nipper and dremel.  -Callus(es) L hallux pared utilizing sterile scalpel blade without complication or incident. Total number debrided =1. -Painful porokeratotic lesion(s) submet head 2 left foot, submet head 2 right foot, submet head 5 left foot and submet head 5 right foot pared and enucleated with sterile scalpel blade without incident. Total number of lesions=5. -Patient to continue soft, supportive shoe gear daily. -We will plan for left  great toe matrixectomy on her next visit on her January visit. -Patient/POA to call should there be question/concern in the interim.   Return in about 3 months (around 10/30/2020).  Marzetta Board, DPM

## 2020-08-04 ENCOUNTER — Other Ambulatory Visit: Payer: Self-pay | Admitting: Neurology

## 2020-08-06 ENCOUNTER — Ambulatory Visit: Payer: PPO | Admitting: Neurology

## 2020-08-06 ENCOUNTER — Encounter: Payer: Self-pay | Admitting: Neurology

## 2020-08-06 ENCOUNTER — Other Ambulatory Visit: Payer: Self-pay

## 2020-08-06 VITALS — BP 169/91 | HR 76 | Ht 61.0 in | Wt 187.0 lb

## 2020-08-06 DIAGNOSIS — G44049 Chronic paroxysmal hemicrania, not intractable: Secondary | ICD-10-CM

## 2020-08-06 DIAGNOSIS — M5481 Occipital neuralgia: Secondary | ICD-10-CM | POA: Diagnosis not present

## 2020-08-06 MED ORDER — NORTRIPTYLINE HCL 10 MG PO CAPS: 10.0000 mg | ORAL_CAPSULE | Freq: Every day | ORAL | 5 refills | Status: DC

## 2020-08-06 NOTE — Patient Instructions (Signed)
1.  Start nortriptyline 10mg  at bedtime.  We can increase dose in 6 weeks if needed (contact me at that time) 2.  Check with insurance about approval for occipital nerve block for occipital neuralgia, chronic paroxysmal hemicrania or hemicrania continua.  We will submit for prior authorization as well. 3.  Limit use of pain relievers to no more than 2 days out of week to prevent risk of rebound or medication-overuse headache. 4. Keep headache diary 5.  Follow up 4 to 6 months.

## 2020-08-16 ENCOUNTER — Ambulatory Visit (INDEPENDENT_AMBULATORY_CARE_PROVIDER_SITE_OTHER): Payer: PPO | Admitting: Nurse Practitioner

## 2020-08-16 ENCOUNTER — Encounter: Payer: Self-pay | Admitting: Nurse Practitioner

## 2020-08-16 ENCOUNTER — Other Ambulatory Visit: Payer: Self-pay

## 2020-08-16 VITALS — BP 140/90 | HR 78 | Temp 97.9°F | Ht 61.0 in | Wt 185.0 lb

## 2020-08-16 DIAGNOSIS — E782 Mixed hyperlipidemia: Secondary | ICD-10-CM

## 2020-08-16 DIAGNOSIS — R739 Hyperglycemia, unspecified: Secondary | ICD-10-CM

## 2020-08-16 DIAGNOSIS — E1165 Type 2 diabetes mellitus with hyperglycemia: Secondary | ICD-10-CM

## 2020-08-16 DIAGNOSIS — I10 Essential (primary) hypertension: Secondary | ICD-10-CM

## 2020-08-16 NOTE — Patient Instructions (Addendum)
[facility] wants you to obtain your records from [facility] before they can schedule an appt with Dr. Amil Amen.  Go to lab for blood draw  You will be contacted once we have egg free flu vaccine.

## 2020-08-16 NOTE — Assessment & Plan Note (Addendum)
Repeat HgbA1c today: HgbA1c of 6.5 indicates diabetes. I recommend use of metformin 250mg  once a day. Stable renal function F/up in 65months

## 2020-08-16 NOTE — Progress Notes (Signed)
Subjective:  Patient ID: Mary Collier, female    DOB: [birth year]  Age: 69 y.o. MRN: [id number]  CC: Follow-up (6 month f/u on HTN and hyperlipidemia)  HPI  HTN (hypertension) Stable BP with losartan BP Readings from Last 3 Encounters:  08/16/20 140/90  08/06/20 (!) 169/91  05/11/20 124/76   Repeat BMP and maintain current medications  DM (diabetes mellitus) ([place]) Repeat HgbA1c today: HgbA1c of 6.5 indicates diabetes. I recommend use of metformin 250mg  once a day. Stable renal function F/up in 59months  Hyperlipidemia Lipid Panel     Component Value Date/Time   CHOL 140 02/14/2020 1209   CHOL 171 04/09/2016 0825   TRIG 138.0 02/14/2020 1209   HDL 33.70 (L) 02/14/2020 1209   HDL 47 04/09/2016 0825   CHOLHDL 4 02/14/2020 1209   VLDL 27.6 02/14/2020 1209   LDLCALC 79 02/14/2020 1209   LDLCALC 89 06/15/2018 1555   LABVLDL 28 04/09/2016 0825  normal CK Maintain atorvastatin dose  BP Readings from Last 3 Encounters:  08/16/20 140/90  08/06/20 (!) 169/91  05/11/20 124/76   Reviewed past Medical, Social and Family history today.  Outpatient Medications Prior to Visit  Medication Sig Dispense Refill   acetaminophen (TYLENOL) 325 MG tablet Take 325 mg by mouth as needed.       atorvastatin (LIPITOR) 10 MG tablet Take 1 tablet (10 mg total) by mouth daily. 90 tablet 3   bimatoprost (LUMIGAN) 0.03 % ophthalmic solution Place 1 drop into both eyes at bedtime.       calcium-vitamin D (OSCAL 500/200 D-3) 500-200 MG-UNIT per tablet Take 1 tablet by mouth 2 (two) times daily. 60 tablet 12   CVS E 200 units capsule Take 200 Units by mouth daily.  0   CVS SENNA PLUS 8.6-50 MG tablet TAKE 1 TABLET BY MOUTH DAILY. 90 tablet 1   diclofenac sodium (VOLTAREN) 1 % GEL APPLY 2 G TOPICALLY 3 (THREE) TIMES DAILY AS NEEDED. 100 g 0   dimenhyDRINATE (DRAMAMINE) 50 MG tablet Take 50 mg by mouth every 8 (eight) hours as needed for dizziness.     fluticasone (FLONASE) 50 MCG/ACT nasal  spray SPRAY 2 SPRAYS INTO EACH NOSTRIL EVERY DAY 48 mL 0   folic acid (FOLVITE) 1 MG tablet Take 1 mg by mouth daily.     hydrocortisone (ANUSOL-HC) 25 MG suppository PLACE 1 SUPPOSITORY PER RECTUM TWICE A DAY 28 suppository 2   losartan (COZAAR) 25 MG tablet Take 1 tablet (25 mg total) by mouth daily. 90 tablet 3   loteprednol (LOTEMAX) 0.5 % ophthalmic suspension 1 drop 2 (two) times daily.     LUMIGAN 0.01 % SOLN 1 drop at bedtime.     metoprolol succinate (TOPROL-XL) 25 MG 24 hr tablet Take 1 tablet (25 mg total) by mouth daily. 90 tablet 3   omeprazole (PRILOSEC) 40 MG capsule TAKE 1 CAPSULE BY MOUTH EVERY DAY 90 capsule 0   OVER THE COUNTER MEDICATION Recicare cream--otc--TID for 10 days--then PRN--per GI doctor      RESTASIS 0.05 % ophthalmic emulsion 1 drop 2 (two) times daily.     timolol (TIMOPTIC) 0.5 % ophthalmic solution 1 drop every morning.     Travoprost, BAK Free, (TRAVATAN) 0.004 % SOLN ophthalmic solution 1 drop at bedtime.      nortriptyline (PAMELOR) 10 MG capsule Take 1 capsule (10 mg total) by mouth at bedtime. (Patient not taking: Reported on 08/16/2020) 30 capsule 5   ipratropium (ATROVENT) 0.03 % nasal spray  (  Patient not taking: Reported on 08/16/2020)     topiramate (TOPAMAX) 25 MG tablet TAKE 1 TABLET AT BEDTIME FOR A WEEK, THEN 2 TABLETS AT BEDTIME. (Patient not taking: Reported on 08/16/2020)     triamcinolone (NASACORT ALLERGY 24HR) 55 MCG/ACT AERO nasal inhaler Place 2 sprays into the nose as needed.  (Patient not taking: Reported on 08/16/2020)     verapamil (CALAN-SR) 120 MG CR tablet Take 1 tablet (120 mg total) by mouth at bedtime. (Patient not taking: Reported on 08/16/2020) 30 tablet 3   No facility-administered medications prior to visit.    ROS See HPI  Objective:  BP 140/90 (BP Location: Left Arm, Patient Position: Sitting, Cuff Size: Large)    Pulse 78    Temp 97.9 F (36.6 C) (Temporal)    Ht 5\' 1"  (1.549 m)    Wt 185 lb (83.9 kg)     SpO2 98%    BMI 34.96 kg/m   Physical Exam Constitutional:      Appearance: She is obese.  Cardiovascular:     Rate and Rhythm: Normal rate and regular rhythm.     Pulses: Normal pulses.     Heart sounds: Normal heart sounds.  Pulmonary:     Effort: Pulmonary effort is normal.     Breath sounds: Normal breath sounds.  Musculoskeletal:     Right lower leg: No edema.     Left lower leg: No edema.  Neurological:     Mental Status: She is alert and oriented to person, place, and time.    Assessment & Plan:  This visit occurred during the SARS-CoV-2 public health emergency.  Safety protocols were in place, including screening questions prior to the visit, additional usage of staff PPE, and extensive cleaning of exam room while observing appropriate contact time as indicated for disinfecting solutions.   Mary Collier was seen today for follow-up.  Diagnoses and all orders for this visit:  Primary hypertension -     Basic metabolic panel  Type 2 diabetes mellitus with hyperglycemia, without long-term current use of insulin (HCC) -     Hemoglobin A1c -     metFORMIN (GLUCOPHAGE) 500 MG tablet; Take 0.5 tablets (250 mg total) by mouth 2 (two) times daily with a meal.  Mixed hyperlipidemia    Problem List Items Addressed This Visit      Cardiovascular and Mediastinum   HTN (hypertension) - Primary    Stable BP with losartan BP Readings from Last 3 Encounters:  08/16/20 140/90  08/06/20 (!) 169/91  05/11/20 124/76   Repeat BMP and maintain current medications      Relevant Orders   Basic metabolic panel (Completed)     Endocrine   DM (diabetes mellitus) ([place])    Repeat HgbA1c today: HgbA1c of 6.5 indicates diabetes. I recommend use of metformin 250mg  once a day. Stable renal function F/up in 9months      Relevant Medications   metFORMIN (GLUCOPHAGE) 500 MG tablet   Other Relevant Orders   Hemoglobin A1c (Completed)     Other   Hyperlipidemia    Lipid Panel       Component Value Date/Time   CHOL 140 02/14/2020 1209   CHOL 171 04/09/2016 0825   TRIG 138.0 02/14/2020 1209   HDL 33.70 (L) 02/14/2020 1209   HDL 47 04/09/2016 0825   CHOLHDL 4 02/14/2020 1209   VLDL 27.6 02/14/2020 1209   LDLCALC 79 02/14/2020 1209   LDLCALC 89 06/15/2018 1555   LABVLDL 28 04/09/2016  0825  normal CK Maintain atorvastatin dose         Follow-up: Return in about 6 months (around 02/14/2021) for CPE (fasting).  Wilfred Lacy, NP

## 2020-08-16 NOTE — Assessment & Plan Note (Signed)
Lipid Panel     Component Value Date/Time   CHOL 140 02/14/2020 1209   CHOL 171 04/09/2016 0825   TRIG 138.0 02/14/2020 1209   HDL 33.70 (L) 02/14/2020 1209   HDL 47 04/09/2016 0825   CHOLHDL 4 02/14/2020 1209   VLDL 27.6 02/14/2020 1209   LDLCALC 79 02/14/2020 1209   LDLCALC 89 06/15/2018 1555   LABVLDL 28 04/09/2016 0825  normal CK Maintain atorvastatin dose

## 2020-08-16 NOTE — Assessment & Plan Note (Signed)
Stable BP with losartan BP Readings from Last 3 Encounters:  08/16/20 140/90  08/06/20 (!) 169/91  05/11/20 124/76   Repeat BMP and maintain current medications

## 2020-08-17 LAB — BASIC METABOLIC PANEL
BUN: 15 mg/dL (ref 6–23)
CO2: 26 mEq/L (ref 19–32)
Calcium: 9.5 mg/dL (ref 8.4–10.5)
Chloride: 106 mEq/L (ref 96–112)
Creatinine, Ser: 0.97 mg/dL (ref 0.40–1.20)
GFR: 59.93 mL/min — ABNORMAL LOW (ref >60.00)
Glucose, Bld: 82 mg/dL (ref 70–99)
Potassium: 4.8 mEq/L (ref 3.5–5.1)
Sodium: 139 mEq/L (ref 135–145)

## 2020-08-17 LAB — HEMOGLOBIN A1C: Hgb A1c MFr Bld: 6.5 % (ref 4.6–6.5)

## 2020-08-21 ENCOUNTER — Telehealth: Payer: Self-pay | Admitting: Nurse Practitioner

## 2020-08-21 MED ORDER — METFORMIN HCL 500 MG PO TABS: 250.0000 mg | ORAL_TABLET | Freq: Two times a day (BID) | ORAL | 3 refills | Status: DC

## 2020-08-21 NOTE — Telephone Encounter (Signed)
Called to get patient scheduled for egg free flu shot at [facility] on their nurse schedule. No answer and mailbox is full.

## 2020-08-22 ENCOUNTER — Encounter: Payer: Self-pay | Admitting: Neurology

## 2020-08-22 ENCOUNTER — Ambulatory Visit: Payer: PPO | Admitting: Neurology

## 2020-08-22 DIAGNOSIS — Z029 Encounter for administrative examinations, unspecified: Secondary | ICD-10-CM

## 2020-08-23 ENCOUNTER — Other Ambulatory Visit: Payer: Self-pay | Admitting: Nurse Practitioner

## 2020-08-23 DIAGNOSIS — E1165 Type 2 diabetes mellitus with hyperglycemia: Secondary | ICD-10-CM

## 2020-08-23 NOTE — Assessment & Plan Note (Signed)
She declined to take metformin

## 2020-09-08 ENCOUNTER — Other Ambulatory Visit: Payer: Self-pay | Admitting: Neurology

## 2020-11-12 ENCOUNTER — Ambulatory Visit (INDEPENDENT_AMBULATORY_CARE_PROVIDER_SITE_OTHER): Payer: PPO | Admitting: Podiatry

## 2020-11-12 ENCOUNTER — Encounter: Payer: Self-pay | Admitting: Podiatry

## 2020-11-12 ENCOUNTER — Other Ambulatory Visit: Payer: Self-pay

## 2020-11-12 DIAGNOSIS — B351 Tinea unguium: Secondary | ICD-10-CM | POA: Diagnosis not present

## 2020-11-12 DIAGNOSIS — M79675 Pain in left toe(s): Secondary | ICD-10-CM | POA: Diagnosis not present

## 2020-11-12 DIAGNOSIS — M79674 Pain in right toe(s): Secondary | ICD-10-CM

## 2020-11-12 DIAGNOSIS — M79671 Pain in right foot: Secondary | ICD-10-CM

## 2020-11-12 DIAGNOSIS — M79672 Pain in left foot: Secondary | ICD-10-CM

## 2020-11-12 DIAGNOSIS — Q828 Other specified congenital malformations of skin: Secondary | ICD-10-CM

## 2020-11-12 NOTE — Patient Instructions (Signed)
EPSOM SALT FOOT SOAK INSTRUCTIONS  *IF YOU HAVE BEEN PRESCRIBED ANTIBIOTICS, TAKE AS INSTRUCTED UNTIL ALL ARE GONE*  Shopping List:  A. Plain epsom salt (not scented) B. 1-inch fabric band-aids  1.  Place 1/4 cup of epsom salts in 2 quarts of warm tap water. IF YOU ARE DIABETIC, OR HAVE NEUROPATHY, CHECK THE TEMPERATURE OF THE WATER WITH YOUR ELBOW.  2.  Submerge your foot/feet in the solution and soak for 10-15 minutes.      3.  Next, remove your foot/feet from solution, blot dry the affected area.    4.  Apply light amount of antibiotic cream/ointment and cover with fabric band-aid .  5.  This soak should be done once a day for 14 days.   6.  Monitor for any signs/symptoms of infection such as redness, swelling, odor, drainage, increased pain, or non-healing of digit.   7.  Please do not hesitate to call the office and speak to a Nurse or Doctor if you have questions.   8.  If you experience fever, chills, nightsweats, nausea or vomiting with worsening of digit, please go to the emergency room.

## 2020-11-16 NOTE — Progress Notes (Signed)
Subjective: Mary Collier presents today for follow up of painful porokeratotic lesion(s) b/l feet and painful mycotic toenails b/l that limit ambulation. Aggravating factors include weightbearing with and without shoe gear. Pain for both is relieved with periodic professional debridement.   She also presents today for permanent removal of painful left hallux nail. She has had right great toenail removed in the past and is pleased with the outcome.  Allergies  Allergen Reactions  . Influenza Vaccines     D/t being allergic to eggs  . Shrimp [Shellfish Allergy] Swelling    Lip, throat, tongue swelling  . Other Itching    Newspaper Dye  . Celecoxib     Able to take IBU  . Codeine Swelling  . Compazine [Prochlorperazine]   . Gabapentin Other (See Comments)    Dizziness and somnolence  . Lisinopril Cough    cough  . Peanut-Containing Drug Products     Itching  . Prednisone     Rapid heart rate  . Prochlorperazine Edisylate     paralyzed  . Robinul [Glycopyrrolate]     boils  . Vioxx [Rofecoxib]     States able to take IBU  . Aspirin Rash    States able to take IBU  . Eggs Or Egg-Derived Products Rash  . Epinephrine Rash  . Erythromycin Rash  . Sulfa Antibiotics Rash  . Tetracycline Rash     Objective: There were no vitals filed for this visit.  Pt 70 y.o. year old female  in NAD. AAO x 3.   Vascular Examination:  Capillary refill time to digits immediate b/l. Palpable DP pulses b/l. Palpable PT pulses b/l. Pedal hair sparse b/l. Skin temperature gradient within normal limits b/l. No edema noted b/l.  Dermatological Examination: Pedal skin with normal turgor, texture and tone bilaterally. No open wounds bilaterally. No interdigital macerations bilaterally. Toenails 2-5 bilaterally and L hallux elongated, dystrophic, thickened, and crumbly with subungual debris and tenderness to dorsal palpation. Porokeratotic lesion(s) submet head 2 left foot, submet head 2 right foot,  submet head 5 left foot and submet head 5 right foot. No erythema, no edema, no drainage, no flocculence.   Left hallux nail plate is thick, elongated, discolored with subungual debris. There is tenderness to palpation.   Musculoskeletal: Normal muscle strength 5/5 to all lower extremity muscle groups bilaterally. No gross bony deformities bilaterally. No pain crepitus or joint limitation noted with ROM b/l.  Neurological: Protective sensation intact 5/5 intact bilaterally with 10g monofilament b/l. Vibratory sensation intact b/l. Proprioception intact bilaterally. Babinski reflex negative b/l. Clonus negative b/l.  Assessment: 1. Pain due to onychomycosis of toenails of both feet   2. Porokeratosis   3. Pain of left great toe   4. Pain in both feet    Plan: -Discussed procedure and post-procedure expectations for total matrixectomy of left great toe. Patient wishes to proceed. Consent signed and in chart. No guarantees given nor implied. -Chlorhexidine prep administered to right hallux. 3 cc's 50/50 mix of 2% xylocaine plain and 0.5% marcaine plain admistered via hallux block. After anesthesia confirmed, nailplate freed from nailved with elevator. Three 30 second applications of phenol applied to nail matrix. Alcohol wash performed. Light bleeding addressed with Lumicaine. Light compressive dressing applied to digit.  -She has a bottle Corticosporin drops from her previous procedure. -Written post-procedure instructions dispensed for daily epsom salt/warm water soaks. -Toenails 2-5 bilaterally debrided in length and girth without iatrogenic bleeding with sterile nail nipper and dremel.  -Painful porokeratotic  lesion(s) submet head 2 left foot, submet head 2 right foot, submet head 5 left foot and submet head 5 right foot pared and enucleated with sterile scalpel blade without incident. -Patient to continue soft, supportive shoe gear daily. -Patient to report any pedal injuries to medical  professional immediately. -Patient/POA to call should there be question/concern in the interim.  Return in about 2 weeks (around 11/26/2020) for nail check of left hallux s/p phenol matrixectomy.

## 2020-11-28 ENCOUNTER — Encounter: Payer: Self-pay | Admitting: Podiatry

## 2020-11-28 ENCOUNTER — Other Ambulatory Visit: Payer: Self-pay

## 2020-11-28 ENCOUNTER — Ambulatory Visit: Payer: PPO | Admitting: Podiatry

## 2020-11-28 DIAGNOSIS — Z9889 Other specified postprocedural states: Secondary | ICD-10-CM | POA: Diagnosis not present

## 2020-11-29 NOTE — Progress Notes (Signed)
Subjective:  Mary Collier presents today s/p total matrixectomy L hallux.  Patient states she has been following post procedure instructions and notes no problems.    Objective: There were no vitals filed for this visit.  70 y.o. female in NAD.Marland Kitchen AAO X 3.  Neurovascular status unchanged b/l lower extremities. Capillary refill time to digits immediate b/l. Palpable pedal pulses b/l LE. Pedal hair sparse. Lower extremity skin temperature gradient within normal limits. No edema noted b/l lower extremities.  Pedal skin with normal turgor, texture and tone bilaterally. No open wounds bilaterally. No interdigital macerations bilaterally. Toenails 1-5 b/l well maintained with adequate length. No erythema, no edema, no drainage, no fluctuance. Procedure site of L hallux noted to be healing as expected with no erythema, no edema, no drainage, no purulence.  Normal muscle strength 5/5 to all lower extremity muscle groups bilaterally. No pain crepitus or joint limitation noted with ROM b/l. No gross bony deformities bilaterally. Patient ambulates independent of any assistive aids.  Protective sensation intact 5/5 intact bilaterally with 10g monofilament b/l. Vibratory sensation intact b/l. Proprioception intact bilaterally.  Assessment: s/p total matrixectomy L hallux  Plan: -Examined patient. -Patient to continue soft, supportive shoe gear daily. -Nailbed(s) gently cleansed of dried serosanguinous drainage. Digit(s) cleansed with alcohol. Light dressing applied. -Patient to report any pedal injuries to medical professional immediately. -Her left hallux is healing as expected. Patient to continue epsom salt soaks and Cortisporin drops once daily until healed. Call office if she has any problems. -Patient/POA to call should there be question/concern in the interim.  Return in about 10 weeks (around 02/06/2021).  Marzetta Board, DPM

## 2020-12-05 ENCOUNTER — Other Ambulatory Visit: Payer: Self-pay | Admitting: Nurse Practitioner

## 2020-12-05 DIAGNOSIS — I10 Essential (primary) hypertension: Secondary | ICD-10-CM

## 2020-12-11 ENCOUNTER — Other Ambulatory Visit: Payer: Self-pay | Admitting: Nurse Practitioner

## 2020-12-11 DIAGNOSIS — K6289 Other specified diseases of anus and rectum: Secondary | ICD-10-CM

## 2020-12-12 NOTE — Telephone Encounter (Signed)
Last OV 08/16/20 Last fill 02/03/19 #28/2

## 2020-12-18 ENCOUNTER — Other Ambulatory Visit: Payer: Self-pay | Admitting: Surgery

## 2020-12-18 DIAGNOSIS — M79622 Pain in left upper arm: Secondary | ICD-10-CM

## 2020-12-18 DIAGNOSIS — M79621 Pain in right upper arm: Secondary | ICD-10-CM

## 2020-12-18 DIAGNOSIS — Q838 Other congenital malformations of breast: Secondary | ICD-10-CM

## 2020-12-19 ENCOUNTER — Ambulatory Visit: Payer: PPO | Admitting: Gastroenterology

## 2020-12-19 ENCOUNTER — Other Ambulatory Visit: Payer: Self-pay

## 2020-12-19 ENCOUNTER — Encounter: Payer: Self-pay | Admitting: Gastroenterology

## 2020-12-19 VITALS — BP 150/80 | HR 56 | Ht 61.0 in | Wt 184.6 lb

## 2020-12-19 DIAGNOSIS — K6289 Other specified diseases of anus and rectum: Secondary | ICD-10-CM | POA: Diagnosis not present

## 2020-12-19 DIAGNOSIS — R194 Change in bowel habit: Secondary | ICD-10-CM

## 2020-12-19 DIAGNOSIS — R1310 Dysphagia, unspecified: Secondary | ICD-10-CM | POA: Diagnosis not present

## 2020-12-19 MED ORDER — DICYCLOMINE HCL 10 MG PO CAPS: 10.0000 mg | ORAL_CAPSULE | Freq: Three times a day (TID) | ORAL | 11 refills | Status: DC

## 2020-12-19 NOTE — Patient Instructions (Signed)
We have sent the following medications to your pharmacy for you to pick up at your convenience: dicyclomine.   Please use your hydrocortisone suppositories once daily x 10 days then twice daily as needed.   You will be contacted by [facility] in the next 2 days to arrange a barium swallow.  The number on your caller ID will be [phone], please answer when they call.  If you have not heard from them in 2 days please call [phone] to schedule.    Thank you for choosing me and [facility].  Pricilla Riffle. Dagoberto Ligas., MD., Mar[facility] Regal

## 2020-12-19 NOTE — Progress Notes (Signed)
    History of Present Illness: This is a 70 year old female with intermittent rectal pain, dysphagia, change in bowel habits with looser stools for 2-3 months. She relates 2-3 times per week she will experience rectal pain at various times that lasts about an hour. She uses an Anusol HC suppository which seems to resolve her symptoms. She also notes softer stools and occasionally looser stools occurring several times per day associated with left lower quadrant pain. Previously she had difficulties with constipation and was taking a daily laxative and she is discontinued her laxative use. She also notes intermittent difficulty swallowing some solid foods and cold liquids for the past 2 to 3 months. Recent colonoscopy and EGD as below.  Colonoscopy in 08/2019 showed 4 small polyps, diverticulosis and internal hemorrhoids  EGD 04/2017 showed H pylori gastritis - treated with 14 days of flagyl, doxycycline, pepto bismol and PPI.    Current Medications, Allergies, Past Medical History, Past Surgical History, Family History and Social History were reviewed in [employer] record.   Physical Exam: General: Well developed, well nourished, no acute distress Head: Normocephalic and atraumatic Eyes: Sclerae anicteric, EOMI Ears: Normal auditory acuity Mouth: Not examined, mask on during Covid-19 pandemic Lungs: Clear throughout to auscultation Heart: Regular rate and rhythm; no murmurs, rubs or bruits Abdomen: Soft, non tender and non distended. No masses, hepatosplenomegaly or hernias noted. Normal Bowel sounds Rectal: No lesions, no tenderness Musculoskeletal: Symmetrical with no gross deformities  Pulses:  Normal pulses noted Extremities: No clubbing, cyanosis, edema or deformities noted Neurological: Alert oriented x 4, grossly nonfocal Psychological:  Alert and cooperative. Normal mood and affect   Assessment and Recommendations:  1. Intermittent rectal pain, change in  bowel habits with looser, softer stools, LLQ pain. Anusol HC supp PR qd x 10 days then bid prn. Start dicyclomine 10 mg po ac & hs. REV in 6 weeks.   2. GERD, dysphagia. Continue omeprazole 40 mg po qd. Schedule barium esophagram. REV in 6 weeks.

## 2020-12-20 ENCOUNTER — Other Ambulatory Visit: Payer: Self-pay

## 2020-12-20 ENCOUNTER — Ambulatory Visit
Admission: RE | Admit: 2020-12-20 | Discharge: 2020-12-20 | Disposition: A | Discharge status: Home / Self Care | Payer: PPO | Source: Ambulatory Visit | Attending: Surgery | Admitting: Surgery

## 2020-12-20 ENCOUNTER — Other Ambulatory Visit: Payer: Self-pay | Admitting: Surgery

## 2020-12-20 DIAGNOSIS — M79621 Pain in right upper arm: Secondary | ICD-10-CM

## 2020-12-20 DIAGNOSIS — Q838 Other congenital malformations of breast: Secondary | ICD-10-CM

## 2020-12-20 DIAGNOSIS — M79622 Pain in left upper arm: Secondary | ICD-10-CM

## 2020-12-28 ENCOUNTER — Ambulatory Visit (HOSPITAL_COMMUNITY)
Admission: RE | Admit: 2020-12-28 | Discharge: 2020-12-28 | Disposition: A | Discharge status: Home / Self Care | Payer: PPO | Source: Ambulatory Visit | Attending: Gastroenterology | Admitting: Gastroenterology

## 2020-12-28 ENCOUNTER — Other Ambulatory Visit: Payer: Self-pay

## 2020-12-28 DIAGNOSIS — R1310 Dysphagia, unspecified: Secondary | ICD-10-CM | POA: Insufficient documentation

## 2020-12-31 ENCOUNTER — Telehealth: Payer: Self-pay | Admitting: Gastroenterology

## 2020-12-31 ENCOUNTER — Other Ambulatory Visit: Payer: Self-pay

## 2020-12-31 DIAGNOSIS — K6289 Other specified diseases of anus and rectum: Secondary | ICD-10-CM

## 2020-12-31 MED ORDER — HYDROCORTISONE ACETATE 25 MG RE SUPP: RECTAL | 1 refills | Status: DC

## 2020-12-31 NOTE — Telephone Encounter (Signed)
Inbound call from patient returning nurse's call in regards to her results.

## 2020-12-31 NOTE — Telephone Encounter (Signed)
See results notes for details.  

## 2021-01-17 NOTE — Progress Notes (Signed)
NEUROLOGY FOLLOW UP OFFICE NOTE  Mary Collier [id number]  Assessment/Plan:   1.  Paroxysmal hemicrania vs migraine, improved 2.  Right-sided occipital neuralgia, improved 3.  HTN  1.  Nortriptyline 10mg  at bedtime refilled 2.  Follow up 6 months. 3.  Follow up with PCP re: HTN  Subjective:  Mary Collier is a 70year old female hypertension and GERD whofollows up for chronic paroxysmal hemicrania.  UPDATE: Started nortriptyline in October. Occipital pain improved.  Severe but ccurs very infrequent.  Eye pain improved as well.  Moderate and occurs once or twice a week, lasting 10-15 minutes.  Current preventative medications:  Nortriptyline 10mg  at bedtime  HISTORY: She has remote history of headaches and generalized tonic clonic seizures as a teenager, which subsequently resolved.  Around 2010, she developedepisodes ofsharp right sided retro-orbital pain, 10/10, lasting 3 to 10 minutes. There is associated conjunctival injection and watery eye but no ptosis or nasal congestion.There is no associated symptoms such as visual disturbance (specific to these attacks), unilateral facial numbness/weakness, nausea, vomiting, dizziness or focal unilateral weakness or numbness of the body. Theyinitiallyoccurred once a day but then became several times dailyin 2018. There are no triggers. Laying down and applying warm compresses helps. She takes Tylenol. She was previously treated at the [facility] and was diagnosed with ocular migraines.She also has sensation of water in back of the right occipital region. She received occipital nerve blocks years ago, which was effective.   Around 2015, she developed gradual onset of vision loss and visual disturbance. People and objects look like shadows and she sees double or triple. When she reads, she sees several copies of the words stacked on top of each other. She also sees floaters. Occasionally, she sees a  small yellow dot in the vision of her left eye that is intermittent, lasting just seconds. She has been followed by [facility] for several years for glaucoma. Due to persistent visual disturbance, she was evaluated by Dr. Tempie Hoist, a retinal specialist, on 10/23/16. On Humphrey visual field testing, she exhibited severe constriction bilaterally. Retinal exam revealed grade II hypertensive retinopathy with minimal vitreous opacities. It was suggested that her visual field defect may bemigraine-related phenomena.I last saw her in 2018. Sed rate and CRP from 01/01/2017 were 13 and 0.1 respectively. MRI of brain with and without contrast on 01/21/2017 was personally reviewed and was unremarkable. She saw neuro-ophthalmologist Dr. Jolyn Nap later in 2018, who was unable to make any definitive diagnosis. She continues to havethe persistent vision loss.Labs from 10/12/2019 showed sed rate 31 and CRP <1.0.She saw [facility] in June 2021.  Neuro-ophthalmic exam was normal but noted to have significant corneal dryness which could explain her decreased visual acuity.  An ultrasound of the temporal arteries was performed and was negative.  Sometimes she has a twitching in her right eye and has trouble opening it.  Not necessarily associated with headache.  Her ophthalmologist is Dr. Marshall Cork at [facility].  There is no family history of headaches or cerebral aneurysms.  Past medications: Gabapentin (dizziness), topiramate (dizziness), verapamil (dizziness)  PAST MEDICAL HISTORY: Past Medical History:  Diagnosis Date  . Allergic rhinitis   . Allergy   . Arthritis   . Asthma   . Blood transfusion without reported diagnosis    1966  . Cancer ([place])    at age 20.  abdominal  . Cancer of kidney ([place])    at age 25.  Right kidney.  Marland Kitchen  Chronic headache   . Edema   . GERD (gastroesophageal reflux disease)   . Glaucoma   . Hiatal hernia   . Hypertension    . IBS (irritable bowel syndrome)   . Internal hemorrhoid   . Lumbago   . Lump or mass in breast   . Lung cancer ([place])    at age 67  Right lung.  . Other abnormal blood chemistry   . Other and unspecified hyperlipidemia   . Other convulsions    hx seizures at age 34  . Peptic stricture of esophagus   . Reflux esophagitis   . Tubular adenoma of colon 2015  . Unspecified menopausal and postmenopausal disorder   . Unspecified vitamin D deficiency     MEDICATIONS: Current Outpatient Medications on File Prior to Visit  Medication Sig Dispense Refill  . acetaminophen (TYLENOL) 325 MG tablet Take 325 mg by mouth as needed.    Marland Kitchen atorvastatin (LIPITOR) 10 MG tablet Take 1 tablet (10 mg total) by mouth daily. 90 tablet 3  . bimatoprost (LUMIGAN) 0.03 % ophthalmic solution Place 1 drop into both eyes at bedtime.    . calcium-vitamin D (OSCAL 500/200 D-3) 500-200 MG-UNIT per tablet Take 1 tablet by mouth 2 (two) times daily. 60 tablet 12  . CVS E 200 units capsule Take 200 Units by mouth daily.  0  . CVS SENNA PLUS 8.6-50 MG tablet TAKE 1 TABLET BY MOUTH DAILY. 90 tablet 1  . diclofenac sodium (VOLTAREN) 1 % GEL APPLY 2 G TOPICALLY 3 (THREE) TIMES DAILY AS NEEDED. 100 g 0  . dicyclomine (BENTYL) 10 MG capsule Take 1 capsule (10 mg total) by mouth 4 (four) times daily -  before meals and at bedtime. 120 capsule 11  . dimenhyDRINATE (DRAMAMINE) 50 MG tablet Take 50 mg by mouth every 8 (eight) hours as needed for dizziness.    . fluticasone (FLONASE) 50 MCG/ACT nasal spray SPRAY 2 SPRAYS INTO EACH NOSTRIL EVERY DAY 48 mL 0  . folic acid (FOLVITE) 1 MG tablet Take 1 mg by mouth daily.    . hydrocortisone (ANUSOL-HC) 25 MG suppository Use one suppository twice a day for 10 days then as needed 28 suppository 1  . losartan (COZAAR) 25 MG tablet TAKE 1 TABLET BY MOUTH EVERY DAY 90 tablet 3  . loteprednol (LOTEMAX) 0.5 % ophthalmic suspension 1 drop 2 (two) times daily.    Marland Kitchen LUMIGAN 0.01 % SOLN 1 drop  at bedtime.    . metoprolol succinate (TOPROL-XL) 25 MG 24 hr tablet Take 1 tablet (25 mg total) by mouth daily. 90 tablet 3  . nortriptyline (PAMELOR) 10 MG capsule TAKE 1 CAPSULE BY MOUTH AT BEDTIME. 90 capsule 2  . omeprazole (PRILOSEC) 40 MG capsule TAKE 1 CAPSULE BY MOUTH EVERY DAY 90 capsule 0  . OVER THE COUNTER MEDICATION Recicare cream--otc--TID for 10 days--then PRN--per GI doctor     . RESTASIS 0.05 % ophthalmic emulsion 1 drop 2 (two) times daily.    . timolol (TIMOPTIC) 0.5 % ophthalmic solution 1 drop every morning.    . Travoprost, BAK Free, (TRAVATAN) 0.004 % SOLN ophthalmic solution 1 drop at bedtime.      No current facility-administered medications on file prior to visit.    ALLERGIES: Allergies  Allergen Reactions  . Influenza Vaccines     D/t being allergic to eggs  . Shrimp [Shellfish Allergy] Swelling    Lip, throat, tongue swelling  . Other Itching    Newspaper Dye  .  Celecoxib     Able to take IBU  . Codeine Swelling  . Compazine [Prochlorperazine]   . Gabapentin Other (See Comments)    Dizziness and somnolence  . Iodine     "Has shellfish allergy, would prefer not to take"  . Lisinopril Cough    cough  . Peanut-Containing Drug Products     Itching  . Prednisone     Rapid heart rate  . Prochlorperazine Edisylate     paralyzed  . Robinul [Glycopyrrolate]     boils  . Vioxx [Rofecoxib]     States able to take IBU  . Aspirin Rash    States able to take IBU  . Eggs Or Egg-Derived Products Rash  . Epinephrine Rash  . Erythromycin Rash  . Sulfa Antibiotics Rash  . Tetracycline Rash    FAMILY HISTORY: Family History  Problem Relation Age of Onset  . Allergies Sister        x3  . Asthma Sister   . Bone cancer Father   . Lung cancer Father   . Stomach cancer Father   . Esophageal cancer Father   . Breast cancer Mother   . Prostate cancer Brother   . Pancreatic cancer Brother        mets from prostate  . Colon cancer Brother   .  Hypertension Brother   . Prostate cancer Brother   . Bone cancer Brother   . Diabetes Brother   . Heart disease Sister   . Diabetes Sister   . Hypertension Sister   . Breast cancer Sister        mastectomy  . Stroke Sister   . Rectal cancer Neg Hx   . Liver cancer Neg Hx       Objective:  Blood pressure (!) 178/84, pulse 67, height 5\' 1"  (1.549 m), weight 188 lb 12.8 oz (85.6 kg), SpO2 99 %. General: No acute distress.  Patient appears well-groomed.   Head:  Normocephalic/atraumatic Eyes:  Fundi examined but not visualized Neck: supple, no paraspinal tenderness, full range of motion Heart:  Regular rate and rhythm Lungs:  Clear to auscultation bilaterally Back: No paraspinal tenderness Neurological Exam: alert and oriented to person, place, and time. Attention span and concentration intact, recent and remote memory intact, fund of knowledge intact.  Speech fluent and not dysarthric, language intact.  CN II-XII intact. Bulk and tone normal, muscle strength 5/5 throughout.  Sensation to light touch  intact.  Deep tendon reflexes 2+ throughout, toes downgoing.  Finger to nose and heel to shin testing intact.  Gait normal, Romberg negative.     Mary Clines, Mary Collier  CC:  Mary Buffy, NP

## 2021-01-18 ENCOUNTER — Other Ambulatory Visit: Payer: Self-pay

## 2021-01-18 ENCOUNTER — Ambulatory Visit: Payer: PPO | Admitting: Neurology

## 2021-01-18 ENCOUNTER — Encounter: Payer: Self-pay | Admitting: Neurology

## 2021-01-18 VITALS — BP 178/84 | HR 67 | Ht 61.0 in | Wt 188.8 lb

## 2021-01-18 DIAGNOSIS — G44039 Episodic paroxysmal hemicrania, not intractable: Secondary | ICD-10-CM | POA: Diagnosis not present

## 2021-01-18 DIAGNOSIS — I10 Essential (primary) hypertension: Secondary | ICD-10-CM | POA: Diagnosis not present

## 2021-01-18 DIAGNOSIS — M5481 Occipital neuralgia: Secondary | ICD-10-CM

## 2021-01-18 MED ORDER — NORTRIPTYLINE HCL 10 MG PO CAPS: 10.0000 mg | ORAL_CAPSULE | Freq: Every day | ORAL | 1 refills | Status: DC

## 2021-01-18 NOTE — Patient Instructions (Signed)
Continue nortriptyline 10mg  at bedtime Follow up 6 months

## 2021-02-05 ENCOUNTER — Ambulatory Visit: Payer: PPO | Admitting: Gastroenterology

## 2021-02-05 ENCOUNTER — Encounter: Payer: Self-pay | Admitting: Gastroenterology

## 2021-02-05 ENCOUNTER — Other Ambulatory Visit: Payer: Self-pay

## 2021-02-05 ENCOUNTER — Other Ambulatory Visit (INDEPENDENT_AMBULATORY_CARE_PROVIDER_SITE_OTHER): Payer: PPO

## 2021-02-05 VITALS — BP 150/80 | HR 68 | Ht 61.0 in | Wt 185.4 lb

## 2021-02-05 DIAGNOSIS — R1032 Left lower quadrant pain: Secondary | ICD-10-CM | POA: Diagnosis not present

## 2021-02-05 DIAGNOSIS — R1319 Other dysphagia: Secondary | ICD-10-CM | POA: Diagnosis not present

## 2021-02-05 DIAGNOSIS — R1012 Left upper quadrant pain: Secondary | ICD-10-CM | POA: Diagnosis not present

## 2021-02-05 DIAGNOSIS — R197 Diarrhea, unspecified: Secondary | ICD-10-CM

## 2021-02-05 LAB — COMPREHENSIVE METABOLIC PANEL
ALT: 16 U/L (ref 0–35)
AST: 16 U/L (ref 0–37)
Albumin: 4.1 g/dL (ref 3.5–5.2)
Alkaline Phosphatase: 80 U/L (ref 39–117)
BUN: 18 mg/dL (ref 6–23)
CO2: 28 mEq/L (ref 19–32)
Calcium: 9.8 mg/dL (ref 8.4–10.5)
Chloride: 103 mEq/L (ref 96–112)
Creatinine, Ser: 0.95 mg/dL (ref 0.40–1.20)
GFR: 61.24 mL/min (ref >60.00)
Glucose, Bld: 93 mg/dL (ref 70–99)
Potassium: 4.3 mEq/L (ref 3.5–5.1)
Sodium: 138 mEq/L (ref 135–145)
Total Bilirubin: 0.3 mg/dL (ref 0.2–1.2)
Total Protein: 8.2 g/dL (ref 6.0–8.3)

## 2021-02-05 LAB — CBC WITH DIFFERENTIAL/PLATELET
Basophils Absolute: 0 10*3/uL (ref 0.0–0.1)
Basophils Relative: 0.5 % (ref 0.0–3.0)
Eosinophils Absolute: 0.1 10*3/uL (ref 0.0–0.7)
Eosinophils Relative: 1.3 % (ref 0.0–5.0)
HCT: 38.4 % (ref 36.0–46.0)
Hemoglobin: 13.1 g/dL (ref 12.0–15.0)
Lymphocytes Relative: 16.8 % (ref 12.0–46.0)
Lymphs Abs: 1.4 10*3/uL (ref 0.7–4.0)
MCHC: 34 g/dL (ref 30.0–36.0)
MCV: 87.3 fl (ref 78.0–100.0)
Monocytes Absolute: 0.8 10*3/uL (ref 0.1–1.0)
Monocytes Relative: 9.1 % (ref 3.0–12.0)
Neutro Abs: 6.1 10*3/uL (ref 1.4–7.7)
Neutrophils Relative %: 72.3 % (ref 43.0–77.0)
Platelets: 222 10*3/uL (ref 150.0–400.0)
RBC: 4.4 Mil/uL (ref 3.87–5.11)
RDW: 13.8 % (ref 11.5–15.5)
WBC: 8.5 10*3/uL (ref 4.0–10.5)

## 2021-02-05 LAB — TSH: TSH: 2.5 u[IU]/mL (ref 0.35–4.50)

## 2021-02-05 NOTE — Progress Notes (Addendum)
    History of Present Illness: This is a 70 year old female returning for follow-up of diarrhea and left-sided abdominal pain.  She continues to note urgent loose bowel movements after meals and occasional fecal incontinence.  She notes increased gas.  She has frequent left lower quadrant pain that is generally associated with meals and bowel movements and left upper quadrant pain that occurs randomly and is not necessarily associated with meals.  The pain in her left upper quadrant can last 15 to 20 minutes at a time.  She notes milk products worsen her symptoms.   Current Medications, Allergies, Past Medical History, Past Surgical History, Family History and Social History were reviewed in [employer] record.   Physical Exam: General: Well developed, well nourished, no acute distress Head: Normocephalic and atraumatic Eyes: Sclerae anicteric, EOMI Ears: Normal auditory acuity Mouth: Not examined, mask on during Covid-19 pandemic Lungs: Clear throughout to auscultation Heart: Regular rate and rhythm; no murmurs, rubs or bruits Abdomen: Soft, mild LUQ, LLQ tenderness and non distended. No masses, hepatosplenomegaly or hernias noted. Normal Bowel sounds Rectal: Not done Musculoskeletal: Symmetrical with no gross deformities  Pulses:  Normal pulses noted Extremities: No clubbing, cyanosis, edema or deformities noted Neurological: Alert oriented x 4, grossly nonfocal Psychological:  Alert and cooperative. Normal mood and affect   Assessment and Recommendations:  1. Diarrhea, LUQ pain, LLQ pain.  Discontinue all lactose products.  Discontinue any other foods, beverages that exacerbate symptoms.  Schedule  CT AP. Obtain CBC, CMP, TSH, tTG, IgA, GI stool profile, fecal elastase. REV in 1 month with me or Tye Savoy, NP.   2. Dysphagia, intermittent.  Barium esophagram showed mild dysmotility.  Advised her to adjust diet to avoid foods that are difficult to swallow  and to chew and swallow carefully.

## 2021-02-05 NOTE — Patient Instructions (Signed)
Your provider has requested that you go to the basement level for lab work before leaving today. Press "B" on the elevator. The lab is located at the first door on the left as you exit the elevator.  You have been scheduled for a CT scan of the abdomen and pelvis at [facility] ([address]---this is in the same building as [employer]).   You are scheduled on 02/07/21 at 1:00pm. You should arrive 15 minutes prior to your appointment time for registration. Please follow the written instructions below on the day of your exam:  WARNING: IF YOU ARE ALLERGIC TO IODINE/X-RAY DYE, PLEASE NOTIFY RADIOLOGY IMMEDIATELY AT [phone]! YOU WILL BE GIVEN A 13 HOUR PREMEDICATION PREP.  1) Do not eat or drink anything after 9:00am (4 hours prior to your test) 2) You have been given 2 bottles of oral contrast to drink. The solution may taste better if refrigerated, but do NOT add ice or any other liquid to this solution. Shake well before drinking.    Drink 1 bottle of contrast @ 11:00am (2 hours prior to your exam)  Drink 1 bottle of contrast @ 12:00pm (1 hour prior to your exam)  You may take any medications as prescribed with a small amount of water, if necessary. If you take any of the following medications: METFORMIN, GLUCOPHAGE, GLUCOVANCE, AVANDAMET, RIOMET, FORTAMET, [place] MET, JANUMET, GLUMETZA or METAGLIP, you MAY be asked to HOLD this medication 48 hours AFTER the exam.  The purpose of you drinking the oral contrast is to aid in the visualization of your intestinal tract. The contrast solution may cause some diarrhea. Depending on your individual set of symptoms, you may also receive an intravenous injection of x-ray contrast/dye. Plan on being at [facility] for 30 minutes or longer, depending on the type of exam you are having performed.  This test typically takes 30-45 minutes to complete.  If you have any questions regarding your exam or if you need to  reschedule, you may call the CT department at [phone] between the hours of 8:00 am and 5:00 pm, Monday-Friday.  ___________________________________________________________ Normal BMI (Body Mass Index- based on height and weight) is between 23 and 30. Your BMI today is Body mass index is 35.03 kg/m. Marland Kitchen Please consider follow up  regarding your BMI with your Primary Care Provider.  Thank you for choosing me and [facility].  Pricilla Riffle. Dagoberto Ligas., MD., Marval Regal

## 2021-02-06 ENCOUNTER — Other Ambulatory Visit: Payer: Self-pay

## 2021-02-06 ENCOUNTER — Other Ambulatory Visit: Payer: PPO

## 2021-02-06 DIAGNOSIS — R197 Diarrhea, unspecified: Secondary | ICD-10-CM

## 2021-02-06 DIAGNOSIS — R1012 Left upper quadrant pain: Secondary | ICD-10-CM

## 2021-02-06 DIAGNOSIS — R1032 Left lower quadrant pain: Secondary | ICD-10-CM

## 2021-02-06 LAB — IGA: Immunoglobulin A: 287 mg/dL (ref 70–320)

## 2021-02-06 LAB — TISSUE TRANSGLUTAMINASE, IGA: (tTG) Ab, IgA: <1 U/mL

## 2021-02-07 ENCOUNTER — Other Ambulatory Visit: Payer: Self-pay

## 2021-02-07 ENCOUNTER — Ambulatory Visit (INDEPENDENT_AMBULATORY_CARE_PROVIDER_SITE_OTHER)
Admission: RE | Admit: 2021-02-07 | Discharge: 2021-02-07 | Disposition: A | Discharge status: Home / Self Care | Payer: PPO | Source: Ambulatory Visit | Attending: Gastroenterology | Admitting: Gastroenterology

## 2021-02-07 DIAGNOSIS — R1032 Left lower quadrant pain: Secondary | ICD-10-CM

## 2021-02-07 DIAGNOSIS — R197 Diarrhea, unspecified: Secondary | ICD-10-CM

## 2021-02-07 DIAGNOSIS — R1012 Left upper quadrant pain: Secondary | ICD-10-CM

## 2021-02-07 MED ORDER — IOHEXOL 300 MG/ML  SOLN
100.0000 mL | Freq: Once | INTRAMUSCULAR | Status: AC | PRN
  Administered 2021-02-07: 100 mL via INTRAVENOUS

## 2021-02-08 LAB — SPECIMEN STATUS REPORT

## 2021-02-09 LAB — SPECIMEN STATUS REPORT

## 2021-02-09 LAB — GI PROFILE, STOOL, PCR

## 2021-02-12 LAB — TEST AUTHORIZATION

## 2021-02-12 LAB — PANCREATIC ELASTASE, FECAL: Pancreatic Elastase, Fecal: 222 ug Elast./g (ref >200)

## 2021-02-12 LAB — EXTRA SPECIMEN

## 2021-02-12 LAB — GI PATHOGEN PANEL BY PCR, STOOL

## 2021-02-12 LAB — SPECIMEN STATUS REPORT

## 2021-02-15 ENCOUNTER — Encounter: Payer: PPO | Admitting: Nurse Practitioner

## 2021-03-07 ENCOUNTER — Ambulatory Visit: Payer: PPO | Admitting: Gastroenterology

## 2021-03-07 ENCOUNTER — Other Ambulatory Visit: Payer: PPO

## 2021-03-07 ENCOUNTER — Encounter: Payer: Self-pay | Admitting: Gastroenterology

## 2021-03-07 VITALS — BP 150/80 | HR 68 | Ht 61.0 in | Wt 183.4 lb

## 2021-03-07 DIAGNOSIS — R197 Diarrhea, unspecified: Secondary | ICD-10-CM

## 2021-03-07 DIAGNOSIS — R0789 Other chest pain: Secondary | ICD-10-CM

## 2021-03-07 DIAGNOSIS — R1032 Left lower quadrant pain: Secondary | ICD-10-CM | POA: Diagnosis not present

## 2021-03-07 DIAGNOSIS — R1012 Left upper quadrant pain: Secondary | ICD-10-CM

## 2021-03-07 MED ORDER — DICYCLOMINE HCL 20 MG PO TABS: 20.0000 mg | ORAL_TABLET | Freq: Three times a day (TID) | ORAL | 11 refills | Status: DC

## 2021-03-07 NOTE — Patient Instructions (Signed)
You can take over the count Gas-x three times a day for gas and bloating.   We have also given you a gas prevention diet.   Increase your dicyclomine to 20 mg (2 tablets) by mouth four times a day. I will send a new prescription to your pharmacy.   Start the low-fodmap diet we have given you today.   Take over the counter Tylenol/Advil twice daily for your left rib pain. Please contact your primary care physician for a follow up if the pain does not get better.   Thank you for choosing me and [facility].  Pricilla Riffle. Dagoberto Ligas., MD., Marval Regal

## 2021-03-07 NOTE — Progress Notes (Signed)
    History of Present Illness: This is a 70 year old female returning for diarrhea, left side abdominal pain and left lower chest pain.  She relates a reduction in abdominal pain since beginning dicyclomine she states she is having looser stools about twice each day.  This has not changed on a lactose-free diet.  For some reason the GI stool profile was not able to be completed.  She also notes increased intestinal gas  Current Medications, Allergies, Past Medical History, Past Surgical History, Family History and Social History were reviewed in [employer] record.   Physical Exam: General: Well developed, well nourished, no acute distress Head: Normocephalic and atraumatic Eyes: Sclerae anicteric, EOMI Ears: Normal auditory acuity Mouth: Not examined, mask on during Covid-19 pandemic Lungs: Clear throughout to auscultation Chest: Left lower rib tenderness, anteriorly and laterally Heart: Regular rate and rhythm; no murmurs, rubs or bruits Abdomen: Soft, non tender and non distended. No masses, hepatosplenomegaly or hernias noted. Normal Bowel sounds Rectal: Not done Musculoskeletal: Symmetrical with no gross deformities  Pulses:  Normal pulses noted Extremities: No clubbing, cyanosis, edema or deformities noted Neurological: Alert oriented x 4, grossly nonfocal Psychological:  Alert and cooperative. Normal mood and affect   Assessment and Recommendations:  1.  Diarrhea, LUQ, LLQ pain, gas. Resend GI stool profile. Low FODAMP diet. Low gas diet. Increase dicyclomine to 20 mg po qid. Begin Gas-X or a generic equivalent 3 times daily as needed.  Fecal elastase was in low normal range. Consider trial of pancreatic enzymes if symptoms persist.   2. Left lower chest wall pain, tenderness. Tylenol or Advil bid for 1 week. Further evaluation and follow by PCP.

## 2021-03-11 ENCOUNTER — Other Ambulatory Visit: Payer: Self-pay

## 2021-03-11 ENCOUNTER — Ambulatory Visit: Payer: PPO | Admitting: Podiatry

## 2021-03-11 DIAGNOSIS — M79675 Pain in left toe(s): Secondary | ICD-10-CM

## 2021-03-11 DIAGNOSIS — M79674 Pain in right toe(s): Secondary | ICD-10-CM

## 2021-03-11 DIAGNOSIS — B351 Tinea unguium: Secondary | ICD-10-CM

## 2021-03-12 ENCOUNTER — Other Ambulatory Visit: Payer: Self-pay | Admitting: Nurse Practitioner

## 2021-03-12 DIAGNOSIS — I10 Essential (primary) hypertension: Secondary | ICD-10-CM

## 2021-03-13 ENCOUNTER — Other Ambulatory Visit: Payer: Self-pay

## 2021-03-13 ENCOUNTER — Ambulatory Visit (INDEPENDENT_AMBULATORY_CARE_PROVIDER_SITE_OTHER): Payer: PPO | Admitting: Nurse Practitioner

## 2021-03-13 ENCOUNTER — Encounter: Payer: Self-pay | Admitting: Nurse Practitioner

## 2021-03-13 VITALS — BP 140/84 | HR 60 | Temp 97.0°F | Ht 61.0 in | Wt 183.8 lb

## 2021-03-13 DIAGNOSIS — R42 Dizziness and giddiness: Secondary | ICD-10-CM | POA: Diagnosis not present

## 2021-03-13 DIAGNOSIS — E1165 Type 2 diabetes mellitus with hyperglycemia: Secondary | ICD-10-CM | POA: Diagnosis not present

## 2021-03-13 DIAGNOSIS — E782 Mixed hyperlipidemia: Secondary | ICD-10-CM | POA: Diagnosis not present

## 2021-03-13 DIAGNOSIS — I700 Atherosclerosis of aorta: Secondary | ICD-10-CM

## 2021-03-13 DIAGNOSIS — N281 Cyst of kidney, acquired: Secondary | ICD-10-CM

## 2021-03-13 DIAGNOSIS — I10 Essential (primary) hypertension: Secondary | ICD-10-CM

## 2021-03-13 DIAGNOSIS — I517 Cardiomegaly: Secondary | ICD-10-CM | POA: Insufficient documentation

## 2021-03-13 LAB — LIPID PANEL
Cholesterol: 161 mg/dL (ref 0–200)
HDL: 37 mg/dL — ABNORMAL LOW (ref >39.00)
LDL Cholesterol: 88 mg/dL (ref 0–99)
NonHDL: 124.18
Total CHOL/HDL Ratio: 4
Triglycerides: 182 mg/dL — ABNORMAL HIGH (ref 0.0–149.0)
VLDL: 36.4 mg/dL (ref 0.0–40.0)

## 2021-03-13 LAB — HEMOGLOBIN A1C: Hgb A1c MFr Bld: 6.3 % (ref 4.6–6.5)

## 2021-03-13 MED ORDER — LOSARTAN POTASSIUM 50 MG PO TABS: 50.0000 mg | ORAL_TABLET | Freq: Every day | ORAL | 1 refills | Status: DC

## 2021-03-13 MED ORDER — ROSUVASTATIN CALCIUM 20 MG PO TABS: 20.0000 mg | ORAL_TABLET | Freq: Every day | ORAL | 3 refills | Status: DC

## 2021-03-13 NOTE — Assessment & Plan Note (Addendum)
Per CT chest Current use of atorvastatin BP not at goal, increased losartan dose Diet Controlled DM Ordered echocardiogram and carotid duplex

## 2021-03-13 NOTE — Assessment & Plan Note (Signed)
BP not at goal BP Readings from Last 3 Encounters:  03/13/21 140/84  03/07/21 (!) 150/80  02/05/21 (!) 150/80   Increase losartan to 50mg  Maintain metoprolol dose F/up in 63month

## 2021-03-13 NOTE — Assessment & Plan Note (Signed)
Abnormal lipid panel: mild elevation in triglyceride and low HDL. Your risk for cardiovascular disease is 24.5% over the next 20yrs. Your recent CT already shows atherosclerosis (plaque) in the aorta. II changed atorvastatin to crestor. Maintain DASH diet. Repeat labs in 25months (fasting)

## 2021-03-13 NOTE — Progress Notes (Signed)
Subjective:  Patient ID: Mary Collier, female    DOB: Jun 29, 1951  Age: 70 y.o. MRN: [id number]  CC: Follow-up (HTN, DM, hyperlipidemia, dizziness, abnormal CT findings)  Dizziness This is a recurrent problem. The current episode started more than 1 month ago. The problem occurs intermittently. The problem has been waxing and waning. Associated symptoms include vertigo. Pertinent negatives include no abdominal pain, congestion, diaphoresis, fatigue, fever, headaches, joint swelling, myalgias, nausea, neck pain, numbness, rash, sore throat, swollen glands, urinary symptoms, visual change, vomiting or weakness. The symptoms are aggravated by bending and exertion. She has tried rest for the symptoms. The treatment provided significant relief.  no syncope, no tinnitus. Daily Exercise regimen: walking 68mins and weight training 67mins (this does not exacerbate her symptoms)  HTN (hypertension) BP not at goal BP Readings from Last 3 Encounters:  03/13/21 140/84  03/07/21 (!) 150/80  02/05/21 (!) 150/80   Increase losartan to 50mg  Maintain metoprolol dose F/up in 61month  Renal cyst, left CT ABD done 01/2021: 1.2by 0.6 by 0.7 cm lesion of the left mid to lower kidney is hypodense and exophytic, likely a cyst, previously 1.0 by 1.0 cm. Normal renal function, no hematuria Repeat CT in 60months  DM (diabetes mellitus) ([place]) Repeat HgbA1c: 6.5% Controlled with diet States she is up to date with eye exam, request record. Normal foot exam BP not at goal  Aortic atherosclerosis [facility]) Per CT chest Current use of atorvastatin BP not at goal, increased losartan dose Diet Controlled DM Ordered echocardiogram and carotid duplex   Mild cardiomegaly Per CT chest completed 01/2021: Descending thoracic aortic atherosclerotic calcification. Mild cardiomegaly with mitral valve calcifications noted.  Today she denies any SOB, LE edema, CP, or palpitations or syncope. She does report intermittent  dizziness with exertion. Ordered echocardiogram and carotid doppler.   Hyperlipidemia Abnormal lipid panel: mild elevation in triglyceride and low HDL. Your risk for cardiovascular disease is 24.5% over the next 30yrs. Your recent CT already shows atherosclerosis (plaque) in the aorta. II changed atorvastatin to crestor. Maintain DASH diet. Repeat labs in 11months (fasting)  Reviewed past Medical, Social and Family history today.  Outpatient Medications Prior to Visit  Medication Sig Dispense Refill  . acetaminophen (TYLENOL) 325 MG tablet Take 325 mg by mouth as needed.    . bimatoprost (LUMIGAN) 0.03 % ophthalmic solution Place 1 drop into both eyes at bedtime.    . calcium-vitamin D (OSCAL 500/200 D-3) 500-200 MG-UNIT per tablet Take 1 tablet by mouth 2 (two) times daily. 60 tablet 12  . CVS E 200 units capsule Take 200 Units by mouth daily.  0  . CVS SENNA PLUS 8.6-50 MG tablet TAKE 1 TABLET BY MOUTH DAILY. 90 tablet 1  . diclofenac sodium (VOLTAREN) 1 % GEL APPLY 2 G TOPICALLY 3 (THREE) TIMES DAILY AS NEEDED. 100 g 0  . dicyclomine (BENTYL) 20 MG tablet Take 1 tablet (20 mg total) by mouth 4 (four) times daily -  before meals and at bedtime. 120 tablet 11  . dimenhyDRINATE (DRAMAMINE) 50 MG tablet Take 50 mg by mouth every 8 (eight) hours as needed for dizziness.    . fluticasone (FLONASE) 50 MCG/ACT nasal spray SPRAY 2 SPRAYS INTO EACH NOSTRIL EVERY DAY 48 mL 0  . folic acid (FOLVITE) 1 MG tablet Take 1 mg by mouth daily.    . hydrocortisone (ANUSOL-HC) 25 MG suppository Use one suppository twice a day for 10 days then as needed 28 suppository 1  . loteprednol (LOTEMAX)  0.5 % ophthalmic suspension 1 drop 2 (two) times daily.    Marland Kitchen LUMIGAN 0.01 % SOLN 1 drop at bedtime.    . metoprolol succinate (TOPROL-XL) 25 MG 24 hr tablet TAKE 1 TABLET BY MOUTH EVERY DAY 90 tablet 3  . nortriptyline (PAMELOR) 10 MG capsule Take 1 capsule (10 mg total) by mouth at bedtime. 90 capsule 1  . omeprazole  (PRILOSEC) 40 MG capsule TAKE 1 CAPSULE BY MOUTH EVERY DAY 90 capsule 0  . OVER THE COUNTER MEDICATION Recicare cream--otc--TID for 10 days--then PRN--per GI doctor     . RESTASIS 0.05 % ophthalmic emulsion 1 drop 2 (two) times daily.    . timolol (TIMOPTIC) 0.5 % ophthalmic solution 1 drop every morning.    . Travoprost, BAK Free, (TRAVATAN) 0.004 % SOLN ophthalmic solution 1 drop at bedtime.     Marland Kitchen atorvastatin (LIPITOR) 10 MG tablet Take 1 tablet (10 mg total) by mouth daily. 90 tablet 3  . losartan (COZAAR) 25 MG tablet TAKE 1 TABLET BY MOUTH EVERY DAY 90 tablet 3   No facility-administered medications prior to visit.    ROS See HPI  Objective:  BP 140/84 (BP Location: Left Arm, Patient Position: Sitting, Cuff Size: Large)   Pulse 60   Temp (!) 97 F (36.1 C) (Temporal)   Ht 5\' 1"  (1.549 m)   Wt 183 lb 12.8 oz (83.4 kg)   SpO2 99%   BMI 34.73 kg/m   Physical Exam Constitutional:      Appearance: She is obese.  Neck:     Vascular: No carotid bruit.  Cardiovascular:     Rate and Rhythm: Normal rate and regular rhythm.     Pulses: Normal pulses.     Heart sounds: Normal heart sounds. No murmur heard.   Pulmonary:     Effort: Pulmonary effort is normal.     Breath sounds: Normal breath sounds.  Musculoskeletal:        General: No swelling or tenderness.     Right lower leg: No edema.     Left lower leg: No edema.  Neurological:     Mental Status: She is alert and oriented to person, place, and time.  Psychiatric:        Mood and Affect: Mood normal.        Behavior: Behavior normal.        Thought Content: Thought content normal.    Assessment & Plan:  This visit occurred during the SARS-CoV-2 public health emergency.  Safety protocols were in place, including screening questions prior to the visit, additional usage of staff PPE, and extensive cleaning of exam room while observing appropriate contact time as indicated for disinfecting solutions.   Mary Collier was seen  today for follow-up.  Diagnoses and all orders for this visit:  Primary hypertension -     ECHOCARDIOGRAM COMPLETE; Future -     rosuvastatin (CRESTOR) 20 MG tablet; Take 1 tablet (20 mg total) by mouth daily.  Mixed hyperlipidemia -     Lipid panel -     VAS US CAROTID; Future -     rosuvastatin (CRESTOR) 20 MG tablet; Take 1 tablet (20 mg total) by mouth daily.  Type 2 diabetes mellitus with hyperglycemia, without long-term current use of insulin (HCC) -     Hemoglobin A1c -     rosuvastatin (CRESTOR) 20 MG tablet; Take 1 tablet (20 mg total) by mouth daily.  Dizziness -     VAS US CAROTID; Future  Mild cardiomegaly -     ECHOCARDIOGRAM COMPLETE; Future -     VAS US CAROTID; Future  Essential hypertension -     losartan (COZAAR) 50 MG tablet; Take 1 tablet (50 mg total) by mouth daily.  Renal cyst, left  Aortic atherosclerosis (HCC) -     VAS US CAROTID; Future -     rosuvastatin (CRESTOR) 20 MG tablet; Take 1 tablet (20 mg total) by mouth daily.   Problem List Items Addressed This Visit      Cardiovascular and Mediastinum   Aortic atherosclerosis ([place])    Per CT chest Current use of atorvastatin BP not at goal, increased losartan dose Diet Controlled DM Ordered echocardiogram and carotid duplex       Relevant Medications   losartan (COZAAR) 50 MG tablet   rosuvastatin (CRESTOR) 20 MG tablet   Other Relevant Orders   VAS US CAROTID   HTN (hypertension) - Primary    BP not at goal BP Readings from Last 3 Encounters:  03/13/21 140/84  03/07/21 (!) 150/80  02/05/21 (!) 150/80   Increase losartan to 50mg  Maintain metoprolol dose F/up in 35month      Relevant Medications   losartan (COZAAR) 50 MG tablet   rosuvastatin (CRESTOR) 20 MG tablet   Other Relevant Orders   ECHOCARDIOGRAM COMPLETE   Mild cardiomegaly    Per CT chest completed 01/2021: Descending thoracic aortic atherosclerotic calcification. Mild cardiomegaly with mitral valve calcifications  noted.  Today she denies any SOB, LE edema, CP, or palpitations or syncope. She does report intermittent dizziness with exertion. Ordered echocardiogram and carotid doppler.       Relevant Medications   losartan (COZAAR) 50 MG tablet   rosuvastatin (CRESTOR) 20 MG tablet   Other Relevant Orders   ECHOCARDIOGRAM COMPLETE   VAS US CAROTID     Endocrine   DM (diabetes mellitus) ([place])    Repeat HgbA1c: 6.5% Controlled with diet States she is up to date with eye exam, request record. Normal foot exam BP not at goal      Relevant Medications   losartan (COZAAR) 50 MG tablet   rosuvastatin (CRESTOR) 20 MG tablet   Other Relevant Orders   Hemoglobin A1c (Completed)     Genitourinary   Renal cyst, left    CT ABD done 01/2021: 1.2by 0.6 by 0.7 cm lesion of the left mid to lower kidney is hypodense and exophytic, likely a cyst, previously 1.0 by 1.0 cm. Normal renal function, no hematuria Repeat CT in 39months        Other   Hyperlipidemia    Abnormal lipid panel: mild elevation in triglyceride and low HDL. Your risk for cardiovascular disease is 24.5% over the next 17yrs. Your recent CT already shows atherosclerosis (plaque) in the aorta. II changed atorvastatin to crestor. Maintain DASH diet. Repeat labs in 104months (fasting)      Relevant Medications   losartan (COZAAR) 50 MG tablet   rosuvastatin (CRESTOR) 20 MG tablet   Other Relevant Orders   Lipid panel (Completed)   VAS US CAROTID    Other Visit Diagnoses    Dizziness       Relevant Orders   VAS US CAROTID   Essential hypertension       Relevant Medications   losartan (COZAAR) 50 MG tablet   rosuvastatin (CRESTOR) 20 MG tablet      Follow-up: Return in about 4 weeks (around 04/10/2021) for HTN and dizziness (30mins).  Wilfred Lacy, NP

## 2021-03-13 NOTE — Assessment & Plan Note (Signed)
Per CT chest completed 01/2021: Descending thoracic aortic atherosclerotic calcification. Mild cardiomegaly with mitral valve calcifications noted.  Today she denies any SOB, LE edema, CP, or palpitations or syncope. She does report intermittent dizziness with exertion. Ordered echocardiogram and carotid doppler.

## 2021-03-13 NOTE — Patient Instructions (Addendum)
Go to lab for blood draw  Have eye exam report faxed to me once completed.  You will be contacted to schedule appt for echocardiogram.  For renal cyst will repeat CT in 6-82months to eval for any changes

## 2021-03-13 NOTE — Assessment & Plan Note (Addendum)
CT ABD done 01/2021: 1.2by 0.6 by 0.7 cm lesion of the left mid to lower kidney is hypodense and exophytic, likely a cyst, previously 1.0 by 1.0 cm. Normal renal function, no hematuria Repeat CT in 61months

## 2021-03-13 NOTE — Assessment & Plan Note (Addendum)
Repeat HgbA1c: 6.5% Controlled with diet States she is up to date with eye exam, request record. Normal foot exam BP not at goal

## 2021-03-14 ENCOUNTER — Encounter: Payer: Self-pay | Admitting: Podiatry

## 2021-03-14 NOTE — Progress Notes (Signed)
  Subjective:  Patient ID: Mary Collier, female    DOB: [birth year],  MRN: [id number]  Mary Collier presents to clinic today for painful thick toenails that are difficult to trim. Pain interferes with ambulation. Aggravating factors include wearing enclosed shoe gear. Pain is relieved with periodic professional debridement.  Allergies  Allergen Reactions  . Influenza Vaccines     D/t being allergic to eggs  . Shrimp [Shellfish Allergy] Swelling    Lip, throat, tongue swelling  . Other Itching    Newspaper Dye  . Celecoxib     Able to take IBU  . Codeine Swelling  . Compazine [Prochlorperazine]   . Gabapentin Other (See Comments)    Dizziness and somnolence  . Iodine     "Has shellfish allergy, would prefer not to take"  . Lisinopril Cough    cough  . Peanut-Containing Drug Products     Itching  . Prednisone     Rapid heart rate  . Prochlorperazine Edisylate     paralyzed  . Robinul [Glycopyrrolate]     boils  . Vioxx [Rofecoxib]     States able to take IBU  . Aspirin Rash    States able to take IBU  . Eggs Or Egg-Derived Products Rash  . Epinephrine Rash  . Erythromycin Rash  . Sulfa Antibiotics Rash  . Tetracycline Rash    Review of Systems: Negative except as noted in the HPI. Objective:   Constitutional Mary Collier is a pleasant 70 y.o. African American female, in NAD. AAO x 3.   Vascular Capillary refill time to digits immediate b/l. Palpable pedal pulses b/l LE. Pedal hair sparse. Lower extremity skin temperature gradient within normal limits. No edema noted b/l lower extremities. No cyanosis or clubbing noted.  Neurologic Normal speech. Oriented to person, place, and time. Protective sensation intact 5/5 intact bilaterally with 10g monofilament b/l. Vibratory sensation intact b/l.  Dermatologic Pedal skin with normal turgor, texture and tone bilaterally. No open wounds bilaterally. No interdigital macerations bilaterally. Toenails 2-5 bilaterally elongated,  discolored, dystrophic, thickened, and crumbly with subungual debris and tenderness to dorsal palpation. Anonychia noted L hallux and R hallux. Nailbed(s) epithelialized.   Orthopedic: Normal muscle strength 5/5 to all lower extremity muscle groups bilaterally. No pain crepitus or joint limitation noted with ROM b/l. No gross bony deformities bilaterally. Patient ambulates independent of any assistive aids.   Radiographs: None Assessment:   1. Pain due to onychomycosis of toenails of both feet    Plan:  Patient was evaluated and treated and all questions answered.  Onychomycosis with pain -Nails palliatively debridement as below -Educated on self-care  Procedure: Nail Debridement Rationale: Pain Type of Debridement: manual, sharp debridement. Instrumentation: Nail nipper, rotary burr. Number of Nails: 8 -Examined patient. -Continue diabetic foot care principles. -Patient to continue soft, supportive shoe gear daily. -Toenails 2-5 bilaterally debrided in length and girth without iatrogenic bleeding with sterile nail nipper and dremel.  -Patient to report any pedal injuries to medical professional immediately. -Patient/POA to call should there be question/concern in the interim.  Return in about 3 months (around 06/11/2021).  Marzetta Board, DPM

## 2021-03-15 ENCOUNTER — Other Ambulatory Visit: Payer: Self-pay

## 2021-03-15 ENCOUNTER — Ambulatory Visit (HOSPITAL_COMMUNITY)
Admission: RE | Admit: 2021-03-15 | Discharge: 2021-03-15 | Disposition: A | Discharge status: Home / Self Care | Payer: PPO | Source: Ambulatory Visit | Attending: Nurse Practitioner | Admitting: Nurse Practitioner

## 2021-03-15 DIAGNOSIS — E782 Mixed hyperlipidemia: Secondary | ICD-10-CM

## 2021-03-15 DIAGNOSIS — R42 Dizziness and giddiness: Secondary | ICD-10-CM | POA: Diagnosis present

## 2021-03-15 DIAGNOSIS — I517 Cardiomegaly: Secondary | ICD-10-CM

## 2021-03-15 DIAGNOSIS — I700 Atherosclerosis of aorta: Secondary | ICD-10-CM | POA: Diagnosis present

## 2021-03-21 ENCOUNTER — Ambulatory Visit: Payer: Self-pay | Admitting: Neurology

## 2021-03-21 ENCOUNTER — Other Ambulatory Visit: Payer: PPO

## 2021-03-21 DIAGNOSIS — R197 Diarrhea, unspecified: Secondary | ICD-10-CM

## 2021-03-23 ENCOUNTER — Other Ambulatory Visit: Payer: Self-pay | Admitting: Family Medicine

## 2021-03-23 ENCOUNTER — Other Ambulatory Visit: Payer: Self-pay | Admitting: Neurology

## 2021-03-23 ENCOUNTER — Other Ambulatory Visit: Payer: Self-pay | Admitting: Nurse Practitioner

## 2021-03-23 DIAGNOSIS — K219 Gastro-esophageal reflux disease without esophagitis: Secondary | ICD-10-CM

## 2021-03-23 DIAGNOSIS — E782 Mixed hyperlipidemia: Secondary | ICD-10-CM

## 2021-03-24 LAB — GI PROFILE, STOOL, PCR

## 2021-03-27 ENCOUNTER — Other Ambulatory Visit: Payer: Self-pay | Admitting: Nurse Practitioner

## 2021-03-27 DIAGNOSIS — I10 Essential (primary) hypertension: Secondary | ICD-10-CM

## 2021-03-29 ENCOUNTER — Encounter: Payer: PPO | Admitting: Nurse Practitioner

## 2021-04-02 ENCOUNTER — Ambulatory Visit (HOSPITAL_COMMUNITY): Payer: PPO | Attending: Internal Medicine

## 2021-04-02 ENCOUNTER — Other Ambulatory Visit: Payer: Self-pay

## 2021-04-02 DIAGNOSIS — I10 Essential (primary) hypertension: Secondary | ICD-10-CM

## 2021-04-02 DIAGNOSIS — I517 Cardiomegaly: Secondary | ICD-10-CM

## 2021-04-02 LAB — ECHOCARDIOGRAM COMPLETE
Area-P 1/2: 2.8 cm2
S' Lateral: 3.2 cm

## 2021-04-02 NOTE — Progress Notes (Signed)
Patient ID: Mary Collier, female   DOB: [birth year], 69 y.o.   MRN: [id number]  Echocardiogram 2D Echocardiogram has been performed.  Jennette Dubin 04/02/21

## 2021-04-02 NOTE — Telephone Encounter (Signed)
Pt called to follow up on refill request  °

## 2021-05-21 ENCOUNTER — Other Ambulatory Visit: Payer: Self-pay

## 2021-05-23 ENCOUNTER — Other Ambulatory Visit: Payer: Self-pay

## 2021-05-23 ENCOUNTER — Ambulatory Visit (INDEPENDENT_AMBULATORY_CARE_PROVIDER_SITE_OTHER): Payer: PPO | Admitting: Nurse Practitioner

## 2021-05-23 ENCOUNTER — Encounter: Payer: Self-pay | Admitting: Nurse Practitioner

## 2021-05-23 VITALS — BP 138/74 | HR 66 | Temp 98.6°F | Wt 179.4 lb

## 2021-05-23 DIAGNOSIS — I10 Essential (primary) hypertension: Secondary | ICD-10-CM | POA: Diagnosis not present

## 2021-05-23 DIAGNOSIS — I700 Atherosclerosis of aorta: Secondary | ICD-10-CM

## 2021-05-23 DIAGNOSIS — E1165 Type 2 diabetes mellitus with hyperglycemia: Secondary | ICD-10-CM | POA: Diagnosis not present

## 2021-05-23 DIAGNOSIS — E782 Mixed hyperlipidemia: Secondary | ICD-10-CM

## 2021-05-23 MED ORDER — METFORMIN HCL 500 MG PO TABS: 250.0000 mg | ORAL_TABLET | Freq: Every day | ORAL | 1 refills | Status: DC

## 2021-05-23 NOTE — Assessment & Plan Note (Signed)
fasting Home glucose: 90-102. She previously declined use of metformin 250mg , but decided to start medication 45months ago. She reports dizziness if taken on empty stomach. Last HgbA1c of 6.3% 39months ago. She has upcoming appt for annual eye exam.  Advised to always take metformin with food. Maintain current dose Repeat hgbA1c in 60month. She is to schedule lab appt

## 2021-05-23 NOTE — Patient Instructions (Addendum)
Return to lab in 3weeks for fasting lab. Schedule lab appt.  Maintain current medications.  Have eye exam report faxed to me once completed.  You can get shringrix vaccine and 4th covid vaccine 2weeks apart through the retail pharmacy.  I sent a message to the clinical supervisor to order [place] for you. You will be contacted once this is available.

## 2021-05-23 NOTE — Assessment & Plan Note (Signed)
She denies any adverse effects with crestor. Repeat LDL-direct

## 2021-05-23 NOTE — Progress Notes (Signed)
Subjective:  Patient ID: Mary Collier, female    DOB: 1950/11/12  Age: 70 y.o. MRN: [id number]  CC: Follow-up (4 week f/u on HTN and vertigo. Pt states there has been some improvement in her BP since last OV, ranging 130-140s/70s)  HPI  HTN (hypertension) BP at goal with losartan and metropolol.  BP Readings from Last 3 Encounters:  05/23/21 138/74  03/13/21 140/84  03/07/21 (!) 150/80   Maintain current doses Repeat BMP  DM (diabetes mellitus) ([place]) fasting Home glucose: 90-102. She previously declined use of metformin 250mg , but decided to start medication 42months ago. She reports dizziness if taken on empty stomach. Last HgbA1c of 6.3% 49months ago. She has upcoming appt for annual eye exam.  Advised to always take metformin with food. Maintain current dose Repeat hgbA1c in 63month. She is to schedule lab appt  Hyperlipidemia She denies any adverse effects with crestor. Repeat LDL-direct  Reviewed past Medical, Social and Family history today.  Outpatient Medications Prior to Visit  Medication Sig Dispense Refill   acetaminophen (TYLENOL) 325 MG tablet Take 325 mg by mouth as needed.     bimatoprost (LUMIGAN) 0.03 % ophthalmic solution Place 1 drop into both eyes at bedtime.     calcium-vitamin D (OSCAL 500/200 D-3) 500-200 MG-UNIT per tablet Take 1 tablet by mouth 2 (two) times daily. 60 tablet 12   CVS E 200 units capsule Take 200 Units by mouth daily.  0   CVS SENNA PLUS 8.6-50 MG tablet TAKE 1 TABLET BY MOUTH DAILY. 90 tablet 1   diclofenac sodium (VOLTAREN) 1 % GEL APPLY 2 G TOPICALLY 3 (THREE) TIMES DAILY AS NEEDED. 100 g 0   dicyclomine (BENTYL) 20 MG tablet Take 1 tablet (20 mg total) by mouth 4 (four) times daily -  before meals and at bedtime. 120 tablet 11   dimenhyDRINATE (DRAMAMINE) 50 MG tablet Take 50 mg by mouth every 8 (eight) hours as needed for dizziness.     fluticasone (FLONASE) 50 MCG/ACT nasal spray SPRAY 2 SPRAYS INTO EACH NOSTRIL EVERY DAY 48  mL 0   folic acid (FOLVITE) 1 MG tablet Take 1 mg by mouth daily.     hydrocortisone (ANUSOL-HC) 25 MG suppository Use one suppository twice a day for 10 days then as needed 28 suppository 1   losartan (COZAAR) 50 MG tablet Take 1 tablet (50 mg total) by mouth daily. 90 tablet 1   loteprednol (LOTEMAX) 0.5 % ophthalmic suspension 1 drop 2 (two) times daily.     LUMIGAN 0.01 % SOLN 1 drop at bedtime.     metoprolol succinate (TOPROL-XL) 25 MG 24 hr tablet TAKE 1 TABLET BY MOUTH EVERY DAY 90 tablet 3   nortriptyline (PAMELOR) 10 MG capsule Take 1 capsule (10 mg total) by mouth at bedtime. 90 capsule 1   omeprazole (PRILOSEC) 40 MG capsule TAKE 1 CAPSULE BY MOUTH EVERY DAY 90 capsule 0   OVER THE COUNTER MEDICATION Recicare cream--otc--TID for 10 days--then PRN--per GI doctor      RESTASIS 0.05 % ophthalmic emulsion 1 drop 2 (two) times daily.     rosuvastatin (CRESTOR) 20 MG tablet Take 1 tablet (20 mg total) by mouth daily. 90 tablet 3   timolol (TIMOPTIC) 0.5 % ophthalmic solution 1 drop every morning.     Travoprost, BAK Free, (TRAVATAN) 0.004 % SOLN ophthalmic solution 1 drop at bedtime.      No facility-administered medications prior to visit.    ROS See HPI  Objective:  BP 138/74 (BP Location: Left Arm, Patient Position: Sitting, Cuff Size: Large)   Pulse 66   Temp 98.6 F (37 C) (Temporal)   Wt 179 lb 6.4 oz (81.4 kg)   SpO2 98%   BMI 33.90 kg/m   Physical Exam Constitutional:      Appearance: She is obese.  Cardiovascular:     Rate and Rhythm: Normal rate and regular rhythm.     Pulses: Normal pulses.     Heart sounds: Normal heart sounds.  Pulmonary:     Effort: Pulmonary effort is normal.     Breath sounds: Normal breath sounds.  Musculoskeletal:     Right lower leg: No edema.     Left lower leg: No edema.  Neurological:     Mental Status: She is alert and oriented to person, place, and time.   Assessment & Plan:  This visit occurred during the SARS-CoV-2  public health emergency.  Safety protocols were in place, including screening questions prior to the visit, additional usage of staff PPE, and extensive cleaning of exam room while observing appropriate contact time as indicated for disinfecting solutions.   [name] was seen today for follow-up.  Diagnoses and all orders for this visit:  Type 2 diabetes mellitus with hyperglycemia, without long-term current use of insulin (HCC) -     metFORMIN (GLUCOPHAGE) 500 MG tablet; Take 0.5 tablets (250 mg total) by mouth daily with breakfast. -     Hemoglobin A1c; Future -     Basic metabolic panel; Future  Aortic atherosclerosis (HCC) -     Lipid panel; Future -     Direct LDL; Future  Mixed hyperlipidemia -     Lipid panel; Future -     Direct LDL; Future  Primary hypertension   Problem List Items Addressed This Visit       Cardiovascular and Mediastinum   Aortic atherosclerosis ([place])   Relevant Orders   Lipid panel   Direct LDL   HTN (hypertension)    BP at goal with losartan and metropolol.  BP Readings from Last 3 Encounters:  05/23/21 138/74  03/13/21 140/84  03/07/21 (!) 150/80   Maintain current doses Repeat BMP        Endocrine   DM (diabetes mellitus) ([place]) - Primary    fasting Home glucose: 90-102. She previously declined use of metformin 250mg , but decided to start medication 10months ago. She reports dizziness if taken on empty stomach. Last HgbA1c of 6.3% 65months ago. She has upcoming appt for annual eye exam.  Advised to always take metformin with food. Maintain current dose Repeat hgbA1c in 67month. She is to schedule lab appt       Relevant Medications   metFORMIN (GLUCOPHAGE) 500 MG tablet   Other Relevant Orders   Hemoglobin M3O   Basic metabolic panel     Other   Hyperlipidemia    She denies any adverse effects with crestor. Repeat LDL-direct       Relevant Orders   Lipid panel   Direct LDL    Follow-up: Return in about 6 months (around  11/23/2021) for DM and HTN, hyperlipidemia (fasting).  Wilfred Lacy, NP

## 2021-05-23 NOTE — Assessment & Plan Note (Signed)
BP at goal with losartan and metropolol.  BP Readings from Last 3 Encounters:  05/23/21 138/74  03/13/21 140/84  03/07/21 (!) 150/80   Maintain current doses Repeat BMP

## 2021-06-13 ENCOUNTER — Other Ambulatory Visit: Payer: Self-pay

## 2021-06-13 ENCOUNTER — Other Ambulatory Visit (INDEPENDENT_AMBULATORY_CARE_PROVIDER_SITE_OTHER): Payer: PPO

## 2021-06-13 DIAGNOSIS — I10 Essential (primary) hypertension: Secondary | ICD-10-CM

## 2021-06-13 DIAGNOSIS — I700 Atherosclerosis of aorta: Secondary | ICD-10-CM

## 2021-06-13 DIAGNOSIS — E1165 Type 2 diabetes mellitus with hyperglycemia: Secondary | ICD-10-CM | POA: Diagnosis not present

## 2021-06-13 DIAGNOSIS — E782 Mixed hyperlipidemia: Secondary | ICD-10-CM | POA: Diagnosis not present

## 2021-06-13 LAB — BASIC METABOLIC PANEL
BUN: 16 mg/dL (ref 6–23)
CO2: 28 mEq/L (ref 19–32)
Calcium: 9.2 mg/dL (ref 8.4–10.5)
Chloride: 103 mEq/L (ref 96–112)
Creatinine, Ser: 0.88 mg/dL (ref 0.40–1.20)
GFR: 66.97 mL/min (ref >60.00)
Glucose, Bld: 93 mg/dL (ref 70–99)
Potassium: 4.2 mEq/L (ref 3.5–5.1)
Sodium: 138 mEq/L (ref 135–145)

## 2021-06-13 LAB — LIPID PANEL
Cholesterol: 123 mg/dL (ref 0–200)
HDL: 38.9 mg/dL — ABNORMAL LOW (ref >39.00)
LDL Cholesterol: 50 mg/dL (ref 0–99)
NonHDL: 83.97
Total CHOL/HDL Ratio: 3
Triglycerides: 171 mg/dL — ABNORMAL HIGH (ref 0.0–149.0)
VLDL: 34.2 mg/dL (ref 0.0–40.0)

## 2021-06-13 LAB — HEMOGLOBIN A1C: Hgb A1c MFr Bld: 6.5 % (ref 4.6–6.5)

## 2021-06-13 LAB — LDL CHOLESTEROL, DIRECT: Direct LDL: 59 mg/dL

## 2021-06-17 ENCOUNTER — Other Ambulatory Visit: Payer: Self-pay | Admitting: Nurse Practitioner

## 2021-06-17 ENCOUNTER — Ambulatory Visit: Payer: PPO | Admitting: Podiatry

## 2021-06-17 DIAGNOSIS — K219 Gastro-esophageal reflux disease without esophagitis: Secondary | ICD-10-CM

## 2021-06-19 MED ORDER — METFORMIN HCL 500 MG PO TABS: 250.0000 mg | ORAL_TABLET | Freq: Every day | ORAL | 3 refills | Status: DC

## 2021-06-19 MED ORDER — LOSARTAN POTASSIUM 50 MG PO TABS: 50.0000 mg | ORAL_TABLET | Freq: Every day | ORAL | 3 refills | Status: DC

## 2021-06-19 MED ORDER — METOPROLOL SUCCINATE ER 25 MG PO TB24: 25.0000 mg | ORAL_TABLET | Freq: Every day | ORAL | 3 refills | Status: DC

## 2021-06-19 NOTE — Addendum Note (Signed)
Addended by: Wilfred Lacy L on: 06/19/2021 01:28 PM   Modules accepted: Orders

## 2021-06-19 NOTE — Assessment & Plan Note (Signed)
Controlled with hgbA1c at 6.5% Continue metformin 250mg  daily

## 2021-06-19 NOTE — Assessment & Plan Note (Signed)
LD at goal with crestor 20mg 

## 2021-07-19 NOTE — Progress Notes (Signed)
NEUROLOGY FOLLOW UP OFFICE NOTE  Mary Collier [id number]  Assessment/Plan:   Paroxysmal hemicrania vs primary stabbing headache vs migraine - increased and now also involving the left side Right-sided occipital neuralgia - stable  1  Increase nortriptyline to 25mg  QHS. If no improvement in 6 weeks, increase to 50mg  at bedtime 2  Follow up 6 months.  Subjective:  Mary Collier is a 70 year old female hypertension and GERD who follows up for chronic paroxysmal hemicrania and right sided occipital neuralgia.   UPDATE: Current preventative medications:  Nortriptyline 10mg  at bedtime She now gets eye pain in the left eye now as well as the right eye, occurs separately, not at same time.  They last 10-15 minutes and occur 2-3 times a day.  This started in May or June.  She has not followed up with Dr. Kathlen Mody.       HISTORY:  She has remote history of headaches and generalized tonic clonic seizures as a teenager, which subsequently resolved.   Around 2010, she developed episodes of sharp right sided retro-orbital pain, 10/10, lasting 3 to 10 minutes.  There is associated conjunctival injection and watery eye but no ptosis or nasal congestion.  There is no associated symptoms such as visual disturbance (specific to these attacks), unilateral facial numbness/weakness, nausea, vomiting, dizziness or focal unilateral weakness or numbness of the body.  They initially occurred once a day but then became several times daily in 2018.  There are no triggers.  Laying down and applying warm compresses helps.  She takes Tylenol.  She was previously treated at the [facility] and was diagnosed with ocular migraines.  She also has sensation of water in back of the right occipital region.  She received occipital nerve blocks years ago, which was effective.     Around 2015, she developed gradual onset of vision loss and visual disturbance.  People and objects look like shadows and she sees  double or triple.  When she reads, she sees several copies of the words stacked on top of each other.  She also sees floaters.  Occasionally, she sees a small yellow dot in the vision of her left eye that is intermittent, lasting just seconds.  She has been followed by [facility] for several years for glaucoma.  Due to persistent visual disturbance, she was evaluated by Dr. Tempie Hoist, a retinal specialist, on 10/23/16. On [facility]rey visual field testing, she exhibited severe constriction bilaterally.  Retinal exam revealed grade II hypertensive retinopathy with minimal vitreous opacities.  It was suggested that her visual field defect may be migraine-related phenomena.  I last saw her in 2018.  Sed rate and CRP from 01/01/2017 were 13 and 0.1 respectively.  MRI of brain with and without contrast on 01/21/2017 was personally reviewed and was unremarkable.  She saw neuro-ophthalmologist Dr. Jolyn Nap later in 2018, who was unable to make any definitive diagnosis.  She continues to have the persistent vision loss.  Labs from 10/12/2019 showed sed rate 31 and CRP <1.0.  She saw [facility] in June 2021.   Neuro-ophthalmic exam was normal but noted to have significant corneal dryness which could explain her decreased visual acuity.  An ultrasound of the temporal arteries was performed and was negative.  Sometimes she has a twitching in her right eye and has trouble opening it.  Not necessarily associated with headache.  Her ophthalmologist is Dr. Marshall Cork at [facility].   There is no family history  of headaches or cerebral aneurysms.   Past medications:  Gabapentin (dizziness), topiramate (dizziness), verapamil (dizziness)  PAST MEDICAL HISTORY: Past Medical History:  Diagnosis Date   Allergic rhinitis    Allergy    Arthritis    Asthma    Blood transfusion without reported diagnosis    1966   Cancer ([place])    at age 6.  abdominal   Cancer of kidney ([place])    at age  89.  Right kidney.   Chronic headache    Edema    GERD (gastroesophageal reflux disease)    Glaucoma    Hiatal hernia    Hypertension    IBS (irritable bowel syndrome)    Internal hemorrhoid    Lumbago    Lump or mass in breast    Lung cancer (HCC)    at age 60  Right lung.   Other abnormal blood chemistry    Other and unspecified hyperlipidemia    Other convulsions    hx seizures at age 9   Peptic stricture of esophagus    Reflux esophagitis    Tubular adenoma of colon 2015   Unspecified menopausal and postmenopausal disorder    Unspecified vitamin D deficiency     MEDICATIONS: Current Outpatient Medications on File Prior to Visit  Medication Sig Dispense Refill   acetaminophen (TYLENOL) 325 MG tablet Take 325 mg by mouth as needed.     bimatoprost (LUMIGAN) 0.03 % ophthalmic solution Place 1 drop into both eyes at bedtime.     calcium-vitamin D (OSCAL 500/200 D-3) 500-200 MG-UNIT per tablet Take 1 tablet by mouth 2 (two) times daily. 60 tablet 12   CVS E 200 units capsule Take 200 Units by mouth daily.  0   CVS SENNA PLUS 8.6-50 MG tablet TAKE 1 TABLET BY MOUTH DAILY. 90 tablet 1   diclofenac sodium (VOLTAREN) 1 % GEL APPLY 2 G TOPICALLY 3 (THREE) TIMES DAILY AS NEEDED. 100 g 0   dicyclomine (BENTYL) 20 MG tablet Take 1 tablet (20 mg total) by mouth 4 (four) times daily -  before meals and at bedtime. 120 tablet 11   dimenhyDRINATE (DRAMAMINE) 50 MG tablet Take 50 mg by mouth every 8 (eight) hours as needed for dizziness.     fluticasone (FLONASE) 50 MCG/ACT nasal spray SPRAY 2 SPRAYS INTO EACH NOSTRIL EVERY DAY 48 mL 0   folic acid (FOLVITE) 1 MG tablet Take 1 mg by mouth daily.     hydrocortisone (ANUSOL-HC) 25 MG suppository Use one suppository twice a day for 10 days then as needed 28 suppository 1   losartan (COZAAR) 50 MG tablet Take 1 tablet (50 mg total) by mouth daily. 90 tablet 3   loteprednol (LOTEMAX) 0.5 % ophthalmic suspension 1 drop 2 (two) times daily.      LUMIGAN 0.01 % SOLN 1 drop at bedtime.     metFORMIN (GLUCOPHAGE) 500 MG tablet Take 0.5 tablets (250 mg total) by mouth daily with breakfast. 45 tablet 3   metoprolol succinate (TOPROL-XL) 25 MG 24 hr tablet Take 1 tablet (25 mg total) by mouth daily. 90 tablet 3   nortriptyline (PAMELOR) 10 MG capsule Take 1 capsule (10 mg total) by mouth at bedtime. 90 capsule 1   omeprazole (PRILOSEC) 40 MG capsule TAKE 1 CAPSULE BY MOUTH EVERY DAY 90 capsule 1   OVER THE COUNTER MEDICATION [place] cream--otc--TID for 10 days--then PRN--per GI doctor      RESTASIS 0.05 % ophthalmic emulsion 1 drop 2 (two)  times daily.     rosuvastatin (CRESTOR) 20 MG tablet Take 1 tablet (20 mg total) by mouth daily. 90 tablet 3   timolol (TIMOPTIC) 0.5 % ophthalmic solution 1 drop every morning.     Travoprost, BAK Free, (TRAVATAN) 0.004 % SOLN ophthalmic solution 1 drop at bedtime.      No current facility-administered medications on file prior to visit.    ALLERGIES: Allergies  Allergen Reactions   Influenza Vaccines     D/t being allergic to eggs   Shrimp [Shellfish Allergy] Swelling    Lip, throat, tongue swelling   Other Itching    Newspaper Dye   Celecoxib     Able to take IBU   Codeine Swelling   Compazine [Prochlorperazine]    Gabapentin Other (See Comments)    Dizziness and somnolence   Iodine     "Has shellfish allergy, would prefer not to take"   Lisinopril Cough    cough   Peanut-Containing Drug Products     Itching   Prednisone     Rapid heart rate   Prochlorperazine Edisylate     paralyzed   Robinul [Glycopyrrolate]     boils   Vioxx [Rofecoxib]     States able to take IBU   Aspirin Rash    States able to take IBU   Eggs Or Egg-Derived Products Rash   Epinephrine Rash   Erythromycin Rash   Sulfa Antibiotics Rash   Tetracycline Rash    FAMILY HISTORY: Family History  Problem Relation Age of Onset   Allergies Sister        x3   Asthma Sister    Bone cancer Father    Lung  cancer Father    Stomach cancer Father    Esophageal cancer Father    Breast cancer Mother    Prostate cancer Brother    Pancreatic cancer Brother        mets from prostate   Colon cancer Brother    Hypertension Brother    Prostate cancer Brother    Bone cancer Brother    Diabetes Brother    Heart disease Sister    Diabetes Sister    Hypertension Sister    Breast cancer Sister        mastectomy   Stroke Sister    Rectal cancer Neg Hx    Liver cancer Neg Hx       Objective:  Blood pressure (!) 151/81, pulse 74, height 5\' 1"  (1.549 m), weight 179 lb 9.6 oz (81.5 kg), SpO2 100 %. General: No acute distress.  Patient appears well-groomed.     Metta Clines, DO  CC: Flossie Buffy, NP

## 2021-07-22 ENCOUNTER — Encounter: Payer: Self-pay | Admitting: Neurology

## 2021-07-22 ENCOUNTER — Ambulatory Visit: Payer: PPO | Admitting: Neurology

## 2021-07-22 ENCOUNTER — Other Ambulatory Visit: Payer: Self-pay

## 2021-07-22 VITALS — BP 151/81 | HR 74 | Ht 61.0 in | Wt 179.6 lb

## 2021-07-22 DIAGNOSIS — R519 Headache, unspecified: Secondary | ICD-10-CM

## 2021-07-22 MED ORDER — NORTRIPTYLINE HCL 25 MG PO CAPS: 25.0000 mg | ORAL_CAPSULE | Freq: Every day | ORAL | 5 refills | Status: DC

## 2021-07-22 NOTE — Patient Instructions (Signed)
Increased nortriptyline to 25mg  at bedtime.  If no improvement in 6 weeks, contact me and we can increase dose.

## 2021-07-24 ENCOUNTER — Telehealth: Payer: Self-pay

## 2021-07-24 NOTE — Telephone Encounter (Signed)
Please fax over the last ov notes. Notes faxed over 07/23/21 vis epic  Another message left by Levada Dy, Please fa over lst ov notes.

## 2021-08-29 ENCOUNTER — Encounter: Payer: Self-pay | Admitting: Neurology

## 2021-09-26 ENCOUNTER — Other Ambulatory Visit: Payer: Self-pay | Admitting: Nurse Practitioner

## 2021-09-26 DIAGNOSIS — E1165 Type 2 diabetes mellitus with hyperglycemia: Secondary | ICD-10-CM

## 2021-09-26 DIAGNOSIS — K219 Gastro-esophageal reflux disease without esophagitis: Secondary | ICD-10-CM

## 2021-10-28 LAB — HM MAMMOGRAPHY

## 2021-10-29 LAB — HM PAP SMEAR

## 2021-11-20 ENCOUNTER — Telehealth: Payer: Self-pay | Admitting: Nurse Practitioner

## 2021-11-28 ENCOUNTER — Ambulatory Visit: Payer: PPO | Admitting: Nurse Practitioner

## 2021-12-04 NOTE — Telephone Encounter (Signed)
Will address at patient upcoming appointment on 12/10/21

## 2021-12-10 ENCOUNTER — Other Ambulatory Visit: Payer: Self-pay

## 2021-12-10 ENCOUNTER — Encounter: Payer: Self-pay | Admitting: Nurse Practitioner

## 2021-12-10 ENCOUNTER — Ambulatory Visit (INDEPENDENT_AMBULATORY_CARE_PROVIDER_SITE_OTHER): Payer: PPO | Admitting: Nurse Practitioner

## 2021-12-10 VITALS — BP 160/80 | HR 66 | Temp 97.6°F | Ht 61.0 in | Wt 175.8 lb

## 2021-12-10 DIAGNOSIS — I10 Essential (primary) hypertension: Secondary | ICD-10-CM | POA: Diagnosis not present

## 2021-12-10 DIAGNOSIS — R413 Other amnesia: Secondary | ICD-10-CM | POA: Diagnosis not present

## 2021-12-10 DIAGNOSIS — E1165 Type 2 diabetes mellitus with hyperglycemia: Secondary | ICD-10-CM

## 2021-12-10 DIAGNOSIS — G8929 Other chronic pain: Secondary | ICD-10-CM

## 2021-12-10 DIAGNOSIS — R109 Unspecified abdominal pain: Secondary | ICD-10-CM

## 2021-12-10 DIAGNOSIS — E782 Mixed hyperlipidemia: Secondary | ICD-10-CM

## 2021-12-10 DIAGNOSIS — E559 Vitamin D deficiency, unspecified: Secondary | ICD-10-CM

## 2021-12-10 DIAGNOSIS — I700 Atherosclerosis of aorta: Secondary | ICD-10-CM | POA: Diagnosis not present

## 2021-12-10 MED ORDER — LOSARTAN POTASSIUM 50 MG PO TABS: 50.0000 mg | ORAL_TABLET | Freq: Every day | ORAL | 3 refills | Status: DC

## 2021-12-10 MED ORDER — METOPROLOL SUCCINATE ER 25 MG PO TB24: 25.0000 mg | ORAL_TABLET | Freq: Every day | ORAL | 3 refills | Status: DC

## 2021-12-10 MED ORDER — ROSUVASTATIN CALCIUM 20 MG PO TABS: 20.0000 mg | ORAL_TABLET | Freq: Every day | ORAL | 3 refills | Status: DC

## 2021-12-10 NOTE — Assessment & Plan Note (Signed)
Present for several years, constant, no change, no change with movement. Pain with palpation, no rash. No change in GI/GU function. Improves with use of tylenol OTC Unable to tolerate gabapentin in past. Current use of TCA-nortriptyline for migraines  Check lipase, hepatic panel, and urinalysis Consider use of cymbalta?

## 2021-12-10 NOTE — Assessment & Plan Note (Signed)
Repeat lipid panel Current use of crestor

## 2021-12-10 NOTE — Assessment & Plan Note (Addendum)
Elevated today, asymptomatic Repeated BP in supine position 160/80 (bilateral arms) Home BP reading 130s/80s Reports she is complaint with medications (losartan 50mg  and metoprolol 25mg  XL) BP Readings from Last 3 Encounters:  12/10/21 (!) 160/80  07/22/21 (!) 151/81  05/23/21 138/74   Advised to monitor BP at home and call office in 3days if BP persistently >140/80. Maintain DASH diet Repeat BMP, TSH today

## 2021-12-10 NOTE — Assessment & Plan Note (Signed)
Controlled with metformin.  Repeat hgbA1c and urine microalbumin today. Maintain med dose at this time

## 2021-12-10 NOTE — Assessment & Plan Note (Signed)
Repeat vit. D °

## 2021-12-10 NOTE — Progress Notes (Signed)
Subjective:  Patient ID: Mary Collier, female    DOB: 1951-06-10  Age: 71 y.o. MRN: [id number]  CC: Follow-up (6 month f/u on HTN, DM, and cholesterol. /Pt is fasting )  HPI  HTN (hypertension) Elevated today, asymptomatic Repeated BP in supine position 160/80 (bilateral arms) Home BP reading 130s/80s Reports she is complaint with medications (losartan 50mg  and metoprolol 25mg  XL) BP Readings from Last 3 Encounters:  12/10/21 (!) 160/80  07/22/21 (!) 151/81  05/23/21 138/74   Advised to monitor BP at home and call office in 3days if BP persistently >140/80. Maintain DASH diet Repeat BMP, TSH today  Aortic atherosclerosis (HCC) Repeat lipid panel Current use of crestor  Vitamin D deficiency Repeat vit. D  Left flank pain, chronic Present for several years, constant, no change, no change with movement. Pain with palpation, no rash. No change in GI/GU function. Improves with use of tylenol OTC Unable to tolerate gabapentin in past. Current use of TCA-nortriptyline for migraines  Check lipase, hepatic panel, and urinalysis Consider use of cymbalta?  Memory difficulty Chronic, worsening per patient MMSE screen today at 27/30  Check B12, folate, RPR, TSH. F/up with neurology  DM (diabetes mellitus) ([place]) Controlled with metformin.  Repeat hgbA1c and urine microalbumin today. Maintain med dose at this time  MMSE - Mini Mental State Exam 12/10/2021 06/15/2018 04/09/2016  Not completed: - - (No Data)  Orientation to time 5 5 5   Orientation to Place 5 5 5   Registration 3 3 3   Attention/ Calculation 3 - 5  Recall 2 3 3   Language- name 2 objects 2 2 2   Language- repeat 1 1 1   Language- follow 3 step command 3 3 3   Language- read & follow direction 1 1 1   Write a sentence 1 1 1   Copy design 1 1 1   Total score 27 - 30    Reviewed past Medical, Social and Family history today.  Outpatient Medications Prior to Visit  Medication Sig Dispense Refill    acetaminophen (TYLENOL) 325 MG tablet Take 325 mg by mouth as needed.     calcium-vitamin D (OSCAL 500/200 D-3) 500-200 MG-UNIT per tablet Take 1 tablet by mouth 2 (two) times daily. 60 tablet 12   CVS E 200 units capsule Take 200 Units by mouth daily.  0   CVS SENNA PLUS 8.6-50 MG tablet TAKE 1 TABLET BY MOUTH DAILY. 90 tablet 1   diclofenac sodium (VOLTAREN) 1 % GEL APPLY 2 G TOPICALLY 3 (THREE) TIMES DAILY AS NEEDED. 100 g 0   dimenhyDRINATE (DRAMAMINE) 50 MG tablet Take 50 mg by mouth every 8 (eight) hours as needed for dizziness.     fluticasone (FLONASE) 50 MCG/ACT nasal spray SPRAY 2 SPRAYS INTO EACH NOSTRIL EVERY DAY 48 mL 0   folic acid (FOLVITE) 1 MG tablet Take 1 mg by mouth daily.     hydrocortisone (ANUSOL-HC) 25 MG suppository Use one suppository twice a day for 10 days then as needed 28 suppository 1   LUMIGAN 0.01 % SOLN 1 drop at bedtime.     metFORMIN (GLUCOPHAGE) 500 MG tablet Take 0.5 tablets (250 mg total) by mouth daily with breakfast. 45 tablet 3   nortriptyline (PAMELOR) 25 MG capsule Take 1 capsule (25 mg total) by mouth at bedtime. 30 capsule 5   omeprazole (PRILOSEC) 40 MG capsule TAKE 1 CAPSULE BY MOUTH EVERY DAY 90 capsule 1   RESTASIS 0.05 % ophthalmic emulsion 1 drop 2 (two) times daily.  Travoprost, BAK Free, (TRAVATAN) 0.004 % SOLN ophthalmic solution 1 drop at bedtime.      losartan (COZAAR) 25 MG tablet Take 25 mg by mouth daily.     losartan (COZAAR) 50 MG tablet Take 1 tablet (50 mg total) by mouth daily. 90 tablet 3   metoprolol succinate (TOPROL-XL) 25 MG 24 hr tablet Take 1 tablet (25 mg total) by mouth daily. 90 tablet 3   rosuvastatin (CRESTOR) 20 MG tablet Take 1 tablet (20 mg total) by mouth daily. 90 tablet 3   dicyclomine (BENTYL) 20 MG tablet Take 1 tablet (20 mg total) by mouth 4 (four) times daily -  before meals and at bedtime. (Patient not taking: Reported on 07/22/2021) 120 tablet 11   loteprednol (LOTEMAX) 0.5 % ophthalmic  suspension 1 drop 2 (two) times daily. (Patient not taking: Reported on 07/22/2021)     [place] cream--otc--TID for 10 days--then PRN--per GI doctor  (Patient not taking: Reported on 07/22/2021)     timolol (TIMOPTIC) 0.5 % ophthalmic solution 1 drop every morning. (Patient not taking: Reported on 07/22/2021)     No facility-administered medications prior to visit.    ROS See HPI  Objective:  BP (!) 160/80 (BP Location: Left Arm, Cuff Size: Large)    Pulse 66    Temp 97.6 F (36.4 C) (Temporal)    Ht 5\' 1"  (1.549 m)    Wt 175 lb 12.8 oz (79.7 kg)    SpO2 100%    BMI 33.22 kg/m   Physical Exam Vitals reviewed.  Constitutional:      Appearance: She is obese.  Cardiovascular:     Rate and Rhythm: Normal rate and regular rhythm.     Pulses: Normal pulses.     Heart sounds: Normal heart sounds.  Pulmonary:     Effort: Pulmonary effort is normal.     Breath sounds: Normal breath sounds.  Abdominal:     General: Bowel sounds are normal. There is no distension.     Palpations: Abdomen is soft. There is no mass.     Tenderness: There is abdominal tenderness in the left upper quadrant. There is left CVA tenderness and guarding. There is no right CVA tenderness.  Musculoskeletal:     Cervical back: Normal range of motion and neck supple.     Right lower leg: No edema.     Left lower leg: No edema.  Skin:    Findings: No erythema or rash.  Neurological:     Mental Status: She is alert and oriented to person, place, and time.  Psychiatric:        Mood and Affect: Mood normal.        Behavior: Behavior normal.        Thought Content: Thought content normal.    Assessment & Plan:  This visit occurred during the SARS-CoV-2 public health emergency.  Safety protocols were in place, including screening questions prior to the visit, additional usage of staff PPE, and extensive cleaning of exam room while observing appropriate contact time as indicated for  disinfecting solutions.   Dajuana was seen today for follow-up.  Diagnoses and all orders for this visit:  Primary hypertension -     Basic metabolic panel -     metoprolol succinate (TOPROL-XL) 25 MG 24 hr tablet; Take 1 tablet (25 mg total) by mouth daily. -     losartan (COZAAR) 50 MG tablet; Take 1 tablet (50 mg total) by mouth daily.  Memory difficulty -     B12 -     Folate -     RPR  Vitamin D deficiency -     Vitamin D (25 hydroxy)  Aortic atherosclerosis (HCC) -     rosuvastatin (CRESTOR) 20 MG tablet; Take 1 tablet (20 mg total) by mouth daily.  Type 2 diabetes mellitus with hyperglycemia, without long-term current use of insulin (HCC) -     Hepatic function panel -     Hemoglobin A1c -     Microalbumin / creatinine urine ratio -     rosuvastatin (CRESTOR) 20 MG tablet; Take 1 tablet (20 mg total) by mouth daily.  Mixed hyperlipidemia -     Hepatic function panel -     Lipid panel -     rosuvastatin (CRESTOR) 20 MG tablet; Take 1 tablet (20 mg total) by mouth daily.  Left flank pain, chronic -     Lipase -     Hepatic function panel -     Urinalysis w microscopic + reflex cultur   Problem List Items Addressed This Visit       Cardiovascular and Mediastinum   Aortic atherosclerosis (HCC)    Repeat lipid panel Current use of crestor      Relevant Medications   rosuvastatin (CRESTOR) 20 MG tablet   metoprolol succinate (TOPROL-XL) 25 MG 24 hr tablet   losartan (COZAAR) 50 MG tablet   HTN (hypertension) - Primary    Elevated today, asymptomatic Repeated BP in supine position 160/80 (bilateral arms) Home BP reading 130s/80s Reports she is complaint with medications (losartan 50mg  and metoprolol 25mg  XL) BP Readings from Last 3 Encounters:  12/10/21 (!) 160/80  07/22/21 (!) 151/81  05/23/21 138/74   Advised to monitor BP at home and call office in 3days if BP persistently >140/80. Maintain DASH diet Repeat BMP, TSH today      Relevant Medications    rosuvastatin (CRESTOR) 20 MG tablet   metoprolol succinate (TOPROL-XL) 25 MG 24 hr tablet   losartan (COZAAR) 50 MG tablet   Other Relevant Orders   Basic metabolic panel     Endocrine   DM (diabetes mellitus) ([place])    Controlled with metformin.  Repeat hgbA1c and urine microalbumin today. Maintain med dose at this time      Relevant Medications   rosuvastatin (CRESTOR) 20 MG tablet   losartan (COZAAR) 50 MG tablet   Other Relevant Orders   Hepatic function panel   Hemoglobin A1c   Microalbumin / creatinine urine ratio     Other   Hyperlipidemia   Relevant Medications   rosuvastatin (CRESTOR) 20 MG tablet   metoprolol succinate (TOPROL-XL) 25 MG 24 hr tablet   losartan (COZAAR) 50 MG tablet   Other Relevant Orders   Hepatic function panel   Lipid panel   Left flank pain, chronic    Present for several years, constant, no change, no change with movement. Pain with palpation, no rash. No change in GI/GU function. Improves with use of tylenol OTC Unable to tolerate gabapentin in past. Current use of TCA-nortriptyline for migraines  Check lipase, hepatic panel, and urinalysis Consider use of cymbalta?      Relevant Orders   Lipase   Hepatic function panel   Urinalysis w microscopic + reflex cultur   Memory difficulty    Chronic, worsening per patient MMSE screen today at 27/30  Check B12, folate, RPR, TSH. F/up with neurology      Relevant Orders  B12   Folate   RPR   Vitamin D deficiency    Repeat vit. D      Relevant Orders   Vitamin D (25 hydroxy)    Follow-up: Return in about 3 months (around 03/09/2022) for DM and HTN (fasting).  Wilfred Lacy, NP

## 2021-12-10 NOTE — Patient Instructions (Addendum)
Go to lab for blood draw and urine collection.  Discuss memory deficit with Dr. Tomi Likens.  Take tylenol 500mg  every 8hrs as needed for pain.  Monitor BP at home every AM Call office if BP persistently > 140/80

## 2021-12-10 NOTE — Assessment & Plan Note (Addendum)
Chronic, worsening per patient MMSE screen today at 27/30  Check B12, folate, RPR, TSH. F/up with neurology

## 2021-12-12 ENCOUNTER — Other Ambulatory Visit: Payer: Self-pay

## 2021-12-12 ENCOUNTER — Other Ambulatory Visit (INDEPENDENT_AMBULATORY_CARE_PROVIDER_SITE_OTHER): Payer: PPO

## 2021-12-12 DIAGNOSIS — E782 Mixed hyperlipidemia: Secondary | ICD-10-CM | POA: Diagnosis not present

## 2021-12-12 DIAGNOSIS — I10 Essential (primary) hypertension: Secondary | ICD-10-CM

## 2021-12-12 DIAGNOSIS — R413 Other amnesia: Secondary | ICD-10-CM | POA: Diagnosis not present

## 2021-12-12 DIAGNOSIS — R109 Unspecified abdominal pain: Secondary | ICD-10-CM | POA: Diagnosis not present

## 2021-12-12 DIAGNOSIS — E559 Vitamin D deficiency, unspecified: Secondary | ICD-10-CM | POA: Diagnosis not present

## 2021-12-12 DIAGNOSIS — G8929 Other chronic pain: Secondary | ICD-10-CM

## 2021-12-12 DIAGNOSIS — E1165 Type 2 diabetes mellitus with hyperglycemia: Secondary | ICD-10-CM

## 2021-12-12 LAB — VITAMIN B12: Vitamin B-12: 408 pg/mL (ref 211–911)

## 2021-12-12 LAB — HEMOGLOBIN A1C: Hgb A1c MFr Bld: 6.2 % (ref 4.6–6.5)

## 2021-12-12 LAB — URINALYSIS W MICROSCOPIC + REFLEX CULTURE
Bacteria, UA: NONE SEEN /HPF
Bilirubin Urine: NEGATIVE
Glucose, UA: NEGATIVE
Hgb urine dipstick: NEGATIVE
Hyaline Cast: NONE SEEN /LPF
Ketones, ur: NEGATIVE
Nitrites, Initial: NEGATIVE
Protein, ur: NEGATIVE
RBC / HPF: NONE SEEN /HPF (ref 0–2)
Specific Gravity, Urine: 1.021 (ref 1.001–1.035)
Squamous Epithelial / HPF: NONE SEEN /HPF (ref <=5)
pH: 5.5 (ref 5.0–8.0)

## 2021-12-12 LAB — URINE CULTURE
MICRO NUMBER:: 13041665
Result:: NO GROWTH
SPECIMEN QUALITY:: ADEQUATE

## 2021-12-12 LAB — BASIC METABOLIC PANEL
BUN: 18 mg/dL (ref 6–23)
CO2: 31 mEq/L (ref 19–32)
Calcium: 9.1 mg/dL (ref 8.4–10.5)
Chloride: 104 mEq/L (ref 96–112)
Creatinine, Ser: 0.92 mg/dL (ref 0.40–1.20)
GFR: 63.27 mL/min (ref >60.00)
Glucose, Bld: 97 mg/dL (ref 70–99)
Potassium: 4.5 mEq/L (ref 3.5–5.1)
Sodium: 138 mEq/L (ref 135–145)

## 2021-12-12 LAB — HEPATIC FUNCTION PANEL
ALT: 13 U/L (ref 0–35)
AST: 14 U/L (ref 0–37)
Albumin: 4.1 g/dL (ref 3.5–5.2)
Alkaline Phosphatase: 67 U/L (ref 39–117)
Bilirubin, Direct: 0.1 mg/dL (ref 0.0–0.3)
Total Bilirubin: 0.4 mg/dL (ref 0.2–1.2)
Total Protein: 7.4 g/dL (ref 6.0–8.3)

## 2021-12-12 LAB — FOLATE: Folate: 19.7 ng/mL (ref >5.9)

## 2021-12-12 LAB — CULTURE INDICATED

## 2021-12-12 LAB — VITAMIN D 25 HYDROXY (VIT D DEFICIENCY, FRACTURES): VITD: 35.15 ng/mL (ref 30.00–100.00)

## 2021-12-12 LAB — LIPASE: Lipase: 22 U/L (ref 11.0–59.0)

## 2021-12-13 LAB — RPR: RPR Ser Ql: NONREACTIVE

## 2022-01-03 ENCOUNTER — Other Ambulatory Visit: Payer: Self-pay | Admitting: Nurse Practitioner

## 2022-01-03 DIAGNOSIS — K219 Gastro-esophageal reflux disease without esophagitis: Secondary | ICD-10-CM

## 2022-01-05 ENCOUNTER — Other Ambulatory Visit: Payer: Self-pay | Admitting: Nurse Practitioner

## 2022-01-05 ENCOUNTER — Other Ambulatory Visit: Payer: Self-pay | Admitting: Gastroenterology

## 2022-01-06 ENCOUNTER — Other Ambulatory Visit: Payer: Self-pay

## 2022-01-06 MED ORDER — DICYCLOMINE HCL 20 MG PO TABS: 20.0000 mg | ORAL_TABLET | Freq: Three times a day (TID) | ORAL | 0 refills | Status: DC

## 2022-01-23 ENCOUNTER — Ambulatory Visit: Payer: PPO | Admitting: Neurology

## 2022-02-17 NOTE — Progress Notes (Signed)
? ?NEUROLOGY FOLLOW UP OFFICE NOTE ? ?Mary Collier ?[id number] ? ?Assessment/Plan:  ? 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   ?  ?1  Nortriptyline 25mg  at bedtime ?2  Follow up with PCP regarding blood pressure ?3  Follow up 1 year or as needed. ?  ?Subjective:  ?Mary Collier is a 71 year old female hypertension and GERD who follows up for chronic paroxysmal hemicrania and right sided occipital neuralgia. ?  ?UPDATE: ?Current preventative medications:  Nortriptyline 25mg  at bedtime ? ?Increased nortriptyline in October.  She notes improvement.  Headaches occur once or twice a week but only last 2-3 minutes.   ?  ?  ?HISTORY:  ?She has remote history of headaches and generalized tonic clonic seizures as a teenager, which subsequently resolved. ?  ?Around 2010, she developed episodes of sharp right sided retro-orbital pain, 10/10, lasting 3 to 10 minutes.  There is associated conjunctival injection and watery eye but no ptosis or nasal congestion.  There is no associated symptoms such as visual disturbance (specific to these attacks), unilateral facial numbness/weakness, nausea, vomiting, dizziness or focal unilateral weakness or numbness of the body.  They initially occurred once a day but then became several times daily in 2018.  There are no triggers.  Laying down and applying warm compresses helps.  She takes Tylenol.  She was previously treated at the [facility] and was diagnosed with ocular migraines.  She also has sensation of water in back of the right occipital region.  She received occipital nerve blocks years ago, which was effective.   ?  ?Around 2015, she developed gradual onset of vision loss and visual disturbance.  People and objects look like shadows and she sees double or triple.  When she  reads, she sees several copies of the words stacked on top of each other.  She also sees floaters.  Occasionally, she sees a small yellow dot in the vision of her left eye that is intermittent, lasting just seconds.  She has been followed by [facility] for several years for glaucoma.  Due to persistent visual disturbance, she was evaluated by Dr. Tempie Hoist, a retinal specialist, on 10/23/16. On [facility]rey visual field testing, she exhibited severe constriction bilaterally.  Retinal exam revealed grade II hypertensive retinopathy with minimal vitreous opacities.  It was suggested that her visual field defect may be migraine-related phenomena.  I last saw her in 2018.  Sed rate and CRP from 01/01/2017 were 13 and 0.1 respectively.  MRI of brain with and without contrast on 01/21/2017 was personally reviewed and was unremarkable.  She saw neuro-ophthalmologist Dr. Jolyn Nap later in 2018, who was unable to make any definitive diagnosis.  She continues to have the persistent vision loss.  Labs from 10/12/2019 showed sed rate 31 and CRP <1.0.  She saw [facility] in June 2021.   Neuro-ophthalmic exam was normal but noted to have significant corneal dryness which could explain her decreased visual acuity.  An ultrasound of the temporal arteries was performed and was negative.  Sometimes she has a twitching in her right eye and has trouble opening it.  Not necessarily associated with headache.  Around May or June 2022, she started getting eye pain in the left eye now as well as the right eye, occurs separately, not at  same time.  They last 10-15 minutes and occur 2-3 times a day.   ?  ?There is no family history of headaches or cerebral aneurysms. ?  ?Past medications:  Gabapentin (dizziness), topiramate (dizziness), verapamil (dizziness) ? ?PAST MEDICAL HISTORY: ?Past Medical History:  ?Diagnosis Date  ? Allergic rhinitis   ? Allergy   ? Arthritis   ? Asthma   ? Blood transfusion without reported  diagnosis   ? 1966  ? Cancer [facility])   ? at age 46.  abdominal  ? Cancer of kidney ([place])   ? at age 67.  Right kidney.  ? Chronic headache   ? Edema   ? GERD (gastroesophageal reflux disease)   ? Glaucoma   ? Hiatal hernia   ? Hypertension   ? IBS (irritable bowel syndrome)   ? Internal hemorrhoid   ? Lumbago   ? Lump or mass in breast   ? Lung cancer ([place])   ? at age 89  Right lung.  ? Other abnormal blood chemistry   ? Other and unspecified hyperlipidemia   ? Other convulsions   ? hx seizures at age 34  ? Peptic stricture of esophagus   ? Reflux esophagitis   ? Tubular adenoma of colon 2015  ? Unspecified menopausal and postmenopausal disorder   ? Unspecified vitamin D deficiency   ? ? ?MEDICATIONS: ?Current Outpatient Medications on File Prior to Visit  ?Medication Sig Dispense Refill  ? acetaminophen (TYLENOL) 325 MG tablet Take 325 mg by mouth as needed.    ? atorvastatin (LIPITOR) 10 MG tablet TAKE 1 TABLET BY MOUTH EVERY DAY 90 tablet 3  ? calcium-vitamin D (OSCAL 500/200 D-3) 500-200 MG-UNIT per tablet Take 1 tablet by mouth 2 (two) times daily. 60 tablet 12  ? CVS E 200 units capsule Take 200 Units by mouth daily.  0  ? CVS SENNA PLUS 8.6-50 MG tablet TAKE 1 TABLET BY MOUTH DAILY. 90 tablet 1  ? diclofenac sodium (VOLTAREN) 1 % GEL APPLY 2 G TOPICALLY 3 (THREE) TIMES DAILY AS NEEDED. 100 g 0  ? dicyclomine (BENTYL) 20 MG tablet Take 1 tablet (20 mg total) by mouth 4 (four) times daily -  before meals and at bedtime. 360 tablet 0  ? dimenhyDRINATE (DRAMAMINE) 50 MG tablet Take 50 mg by mouth every 8 (eight) hours as needed for dizziness.    ? fluticasone (FLONASE) 50 MCG/ACT nasal spray SPRAY 2 SPRAYS INTO EACH NOSTRIL EVERY DAY 48 mL 0  ? folic acid (FOLVITE) 1 MG tablet Take 1 mg by mouth daily.    ? hydrocortisone (ANUSOL-HC) 25 MG suppository Use one suppository twice a day for 10 days then as needed 28 suppository 1  ? losartan (COZAAR) 50 MG tablet Take 1 tablet (50 mg total) by mouth daily. 90 tablet 3   ? LUMIGAN 0.01 % SOLN 1 drop at bedtime.    ? metFORMIN (GLUCOPHAGE) 500 MG tablet Take 0.5 tablets (250 mg total) by mouth daily with breakfast. 45 tablet 3  ? metoprolol succinate (TOPROL-XL) 25 MG 24 hr tablet Take 1 tablet (25 mg total) by mouth daily. 90 tablet 3  ? nortriptyline (PAMELOR) 25 MG capsule Take 1 capsule (25 mg total) by mouth at bedtime. 30 capsule 5  ? omeprazole (PRILOSEC) 40 MG capsule TAKE 1 CAPSULE BY MOUTH EVERY DAY 90 capsule 1  ? RESTASIS 0.05 % ophthalmic emulsion 1 drop 2 (two) times daily.    ? rosuvastatin (CRESTOR) 20 MG tablet Take  1 tablet (20 mg total) by mouth daily. 90 tablet 3  ? Travoprost, BAK Free, (TRAVATAN) 0.004 % SOLN ophthalmic solution 1 drop at bedtime.     ? ?No current facility-administered medications on file prior to visit.  ? ? ?ALLERGIES: ?Allergies  ?Allergen Reactions  ? Influenza Vaccines   ?  D/t being allergic to eggs  ? Shrimp [Shellfish Allergy] Swelling  ?  Lip, throat, tongue swelling  ? Other Itching  ?  Newspaper Dye  ? Celecoxib   ?  Able to take IBU  ? Codeine Swelling  ? Compazine [Prochlorperazine]   ? Gabapentin Other (See Comments)  ?  Dizziness and somnolence  ? Iodine   ?  "Has shellfish allergy, would prefer not to take"  ? Lisinopril Cough  ?  cough  ? Peanut-Containing Drug Products   ?  Itching  ? Prednisone   ?  Rapid heart rate  ? Prochlorperazine Edisylate   ?  paralyzed  ? Robinul [Glycopyrrolate]   ?  boils  ? Vioxx [Rofecoxib]   ?  States able to take IBU  ? Aspirin Rash  ?  States able to take IBU  ? Eggs Or Egg-Derived Products Rash  ? Epinephrine Rash  ? Erythromycin Rash  ? Sulfa Antibiotics Rash  ? Tetracycline Rash  ? ? ?FAMILY HISTORY: ?Family History  ?Problem Relation Age of Onset  ? Allergies Sister   ?     x3  ? Asthma Sister   ? Bone cancer Father   ? Lung cancer Father   ? Stomach cancer Father   ? Esophageal cancer Father   ? Breast cancer Mother   ? Prostate cancer Brother   ? Pancreatic cancer Brother   ?     mets  from prostate  ? Colon cancer Brother   ? Hypertension Brother   ? Prostate cancer Brother   ? Bone cancer Brother   ? Diabetes Brother   ? Heart disease Sister   ? Diabetes Sister   ? Hypertension Sister   ?

## 2022-02-18 ENCOUNTER — Ambulatory Visit: Payer: PPO | Admitting: Neurology

## 2022-02-18 ENCOUNTER — Telehealth: Payer: Self-pay | Admitting: Nurse Practitioner

## 2022-02-18 ENCOUNTER — Encounter: Payer: Self-pay | Admitting: Neurology

## 2022-02-18 VITALS — BP 171/87 | HR 69 | Resp 18 | Ht 61.0 in | Wt 178.0 lb

## 2022-02-18 DIAGNOSIS — M5481 Occipital neuralgia: Secondary | ICD-10-CM | POA: Diagnosis not present

## 2022-02-18 DIAGNOSIS — I10 Essential (primary) hypertension: Secondary | ICD-10-CM | POA: Diagnosis not present

## 2022-02-18 DIAGNOSIS — G44039 Episodic paroxysmal hemicrania, not intractable: Secondary | ICD-10-CM

## 2022-02-18 MED ORDER — NORTRIPTYLINE HCL 25 MG PO CAPS: 25.0000 mg | ORAL_CAPSULE | Freq: Every day | ORAL | 3 refills | Status: DC

## 2022-02-18 MED ORDER — LOSARTAN POTASSIUM 100 MG PO TABS: 100.0000 mg | ORAL_TABLET | Freq: Every day | ORAL | 1 refills | Status: DC

## 2022-02-18 NOTE — Assessment & Plan Note (Signed)
Persistent elevated BP per recent neurology appt. ?Increase losartan to 100mg  daily ?Maintain other med doses ?Maintain upcoming appt on 03/11/22. ?

## 2022-02-18 NOTE — Telephone Encounter (Signed)
LVM for patient to return call. 

## 2022-02-18 NOTE — Patient Instructions (Signed)
Continue nortriptyline 25mg  at bedtime ?Follow up with PCP regarding blood pressure ?Follow up one year ?

## 2022-02-18 NOTE — Telephone Encounter (Signed)
Persistent elevated BP per recent neurology appt. ?Increase losartan to 100mg  daily ?Maintain other med doses ?Maintain upcoming appt on 03/11/22. ?

## 2022-02-20 NOTE — Telephone Encounter (Signed)
Left VM, advised pt to call back. ?

## 2022-02-25 NOTE — Telephone Encounter (Signed)
Unable to reach patient by phone, will inform at upcoming appointment on 03/11/22 ?

## 2022-02-26 ENCOUNTER — Other Ambulatory Visit: Payer: Self-pay | Admitting: Nurse Practitioner

## 2022-02-26 DIAGNOSIS — I10 Essential (primary) hypertension: Secondary | ICD-10-CM

## 2022-02-26 NOTE — Telephone Encounter (Signed)
Duplicate

## 2022-03-11 ENCOUNTER — Encounter: Payer: Self-pay | Admitting: Nurse Practitioner

## 2022-03-11 ENCOUNTER — Ambulatory Visit (INDEPENDENT_AMBULATORY_CARE_PROVIDER_SITE_OTHER): Payer: PPO | Admitting: Nurse Practitioner

## 2022-03-11 VITALS — BP 136/86 | HR 55 | Temp 97.4°F | Ht 61.0 in | Wt 175.0 lb

## 2022-03-11 DIAGNOSIS — I10 Essential (primary) hypertension: Secondary | ICD-10-CM

## 2022-03-11 DIAGNOSIS — R42 Dizziness and giddiness: Secondary | ICD-10-CM

## 2022-03-11 DIAGNOSIS — E1169 Type 2 diabetes mellitus with other specified complication: Secondary | ICD-10-CM | POA: Diagnosis not present

## 2022-03-11 DIAGNOSIS — E785 Hyperlipidemia, unspecified: Secondary | ICD-10-CM | POA: Diagnosis not present

## 2022-03-11 DIAGNOSIS — E1165 Type 2 diabetes mellitus with hyperglycemia: Secondary | ICD-10-CM | POA: Diagnosis not present

## 2022-03-11 HISTORY — DX: Dizziness and giddiness: R42

## 2022-03-11 LAB — LIPID PANEL
Cholesterol: 117 mg/dL (ref 0–200)
HDL: 39.8 mg/dL (ref >39.00)
LDL Cholesterol: 50 mg/dL (ref 0–99)
NonHDL: 77.66
Total CHOL/HDL Ratio: 3
Triglycerides: 138 mg/dL (ref 0.0–149.0)
VLDL: 27.6 mg/dL (ref 0.0–40.0)

## 2022-03-11 LAB — CBC WITH DIFFERENTIAL/PLATELET
Basophils Absolute: 0 10*3/uL (ref 0.0–0.1)
Basophils Relative: 0.4 % (ref 0.0–3.0)
Eosinophils Absolute: 0.1 10*3/uL (ref 0.0–0.7)
Eosinophils Relative: 1.3 % (ref 0.0–5.0)
HCT: 37.8 % (ref 36.0–46.0)
Hemoglobin: 12.4 g/dL (ref 12.0–15.0)
Lymphocytes Relative: 21.9 % (ref 12.0–46.0)
Lymphs Abs: 1.6 10*3/uL (ref 0.7–4.0)
MCHC: 32.9 g/dL (ref 30.0–36.0)
MCV: 87.5 fl (ref 78.0–100.0)
Monocytes Absolute: 0.6 10*3/uL (ref 0.1–1.0)
Monocytes Relative: 7.9 % (ref 3.0–12.0)
Neutro Abs: 5 10*3/uL (ref 1.4–7.7)
Neutrophils Relative %: 68.5 % (ref 43.0–77.0)
Platelets: 204 10*3/uL (ref 150.0–400.0)
RBC: 4.31 Mil/uL (ref 3.87–5.11)
RDW: 13.7 % (ref 11.5–15.5)
WBC: 7.3 10*3/uL (ref 4.0–10.5)

## 2022-03-11 LAB — MICROALBUMIN / CREATININE URINE RATIO
Creatinine,U: 147.8 mg/dL
Microalb Creat Ratio: 0.7 mg/g (ref 0.0–30.0)
Microalb, Ur: 1 mg/dL (ref 0.0–1.9)

## 2022-03-11 LAB — RENAL FUNCTION PANEL
Albumin: 4.2 g/dL (ref 3.5–5.2)
BUN: 17 mg/dL (ref 6–23)
CO2: 29 mEq/L (ref 19–32)
Calcium: 9.5 mg/dL (ref 8.4–10.5)
Chloride: 105 mEq/L (ref 96–112)
Creatinine, Ser: 0.86 mg/dL (ref 0.40–1.20)
GFR: 68.48 mL/min (ref >60.00)
Glucose, Bld: 105 mg/dL — ABNORMAL HIGH (ref 70–99)
Phosphorus: 3.6 mg/dL (ref 2.3–4.6)
Potassium: 4.2 mEq/L (ref 3.5–5.1)
Sodium: 140 mEq/L (ref 135–145)

## 2022-03-11 LAB — CK: Total CK: 147 U/L (ref 7–177)

## 2022-03-11 NOTE — Progress Notes (Signed)
Established Patient Visit  Patient: Mary Collier   DOB: [birth year]   71 y.o. Female  MRN: [id number] Visit Date: 03/11/2022  Subjective:    Chief Complaint  Patient presents with   Office Visit    3 month f/u HTN / DM Checks BP daily ranges about 134/72. Blood sugars daily, between 98-100. Feels dizzy at times since last visit No other concerns   HPI Dizziness Chronic, waxing and waning CT head 02/2021: no acute finding Echo and carotid duplex completed 03/2021: unremarkable. Had eval by ENT, vestibular rehab was recommended. She has not schedule appt. No syncope, not related to exertion, no palpitation Worse with rapid head movement.  Advised to schedule appt with PT check CBC and renal function  Hyperlipidemia associated with type 2 diabetes mellitus ([place]) Maintain crestor dose Repeat lipid panel and check CK  HTN (hypertension) BP at goal with metoprolol and losartan BP Readings from Last 3 Encounters:  03/11/22 136/86  02/18/22 (!) 171/87  12/10/21 (!) 160/80   Maintain med doses Repeat renal function F/up in 3-41months  DM (diabetes mellitus) ([place]) Controlled with metformin uptodate with DM eye exam, report requested Maintain med dose F/up in 52months  Wt Readings from Last 3 Encounters:  03/11/22 175 lb (79.4 kg)  02/18/22 178 lb (80.7 kg)  12/10/21 175 lb 12.8 oz (79.7 kg)    Reviewed medical, surgical, and social history today  Medications: Outpatient Medications Prior to Visit  Medication Sig   acetaminophen (TYLENOL) 325 MG tablet Take 325 mg by mouth as needed.   calcium-vitamin D (OSCAL 500/200 D-3) 500-200 MG-UNIT per tablet Take 1 tablet by mouth 2 (two) times daily.   CVS E 200 units capsule Take 200 Units by mouth daily.   CVS SENNA PLUS 8.6-50 MG tablet TAKE 1 TABLET BY MOUTH DAILY.   diclofenac sodium (VOLTAREN) 1 % GEL APPLY 2 G TOPICALLY 3 (THREE) TIMES DAILY AS NEEDED.   dimenhyDRINATE (DRAMAMINE) 50 MG tablet Take 50  mg by mouth every 8 (eight) hours as needed for dizziness.   fluticasone (FLONASE) 50 MCG/ACT nasal spray SPRAY 2 SPRAYS INTO EACH NOSTRIL EVERY DAY   folic acid (FOLVITE) 1 MG tablet Take 1 mg by mouth daily.   hydrocortisone (ANUSOL-HC) 25 MG suppository Use one suppository twice a day for 10 days then as needed   losartan (COZAAR) 100 MG tablet Take 1 tablet (100 mg total) by mouth daily.   LUMIGAN 0.01 % SOLN 1 drop at bedtime.   metFORMIN (GLUCOPHAGE) 500 MG tablet Take 0.5 tablets (250 mg total) by mouth daily with breakfast.   metoprolol succinate (TOPROL-XL) 25 MG 24 hr tablet Take 1 tablet (25 mg total) by mouth daily.   nortriptyline (PAMELOR) 25 MG capsule Take 1 capsule (25 mg total) by mouth at bedtime.   omeprazole (PRILOSEC) 40 MG capsule TAKE 1 CAPSULE BY MOUTH EVERY DAY   RESTASIS 0.05 % ophthalmic emulsion 1 drop 2 (two) times daily.   rosuvastatin (CRESTOR) 20 MG tablet Take 1 tablet (20 mg total) by mouth daily.   Travoprost, BAK Free, (TRAVATAN) 0.004 % SOLN ophthalmic solution 1 drop at bedtime.    [DISCONTINUED] atorvastatin (LIPITOR) 10 MG tablet TAKE 1 TABLET BY MOUTH EVERY DAY   dicyclomine (BENTYL) 20 MG tablet Take 1 tablet (20 mg total) by mouth 4 (four) times daily -  before meals and at bedtime.   No facility-administered medications prior  to visit.   Reviewed past medical and social history.   ROS per HPI above     Objective:  BP 136/86 (BP Location: Right Arm, Patient Position: Sitting, Cuff Size: Normal)   Pulse (!) 55   Temp (!) 97.4 F (36.3 C) (Temporal)   Ht 5\' 1"  (1.549 m)   Wt 175 lb (79.4 kg)   SpO2 99%   BMI 33.07 kg/m      Physical Exam Vitals reviewed.  Cardiovascular:     Rate and Rhythm: Normal rate and regular rhythm.     Pulses: Normal pulses.     Heart sounds: Murmur heard.  Pulmonary:     Effort: Pulmonary effort is normal.     Breath sounds: Normal breath sounds.  Genitourinary:    General: Normal vulva.     Rectum:  Normal.  Musculoskeletal:     Right lower leg: No edema.     Left lower leg: No edema.  Neurological:     Mental Status: She is alert and oriented to person, place, and time.  Psychiatric:        Mood and Affect: Mood normal.        Behavior: Behavior normal.        Thought Content: Thought content normal.    No results found for any visits on 03/11/22.    Assessment & Plan:    Problem List Items Addressed This Visit       Cardiovascular and Mediastinum   HTN (hypertension) - Primary    BP at goal with metoprolol and losartan BP Readings from Last 3 Encounters:  03/11/22 136/86  02/18/22 (!) 171/87  12/10/21 (!) 160/80   Maintain med doses Repeat renal function F/up in 3-37months      Relevant Orders   Renal Function Panel     Endocrine   DM (diabetes mellitus) ([place])    Controlled with metformin uptodate with DM eye exam, report requested Maintain med dose F/up in 34months       Relevant Orders   Urine Albumin-Creatinine with uACR   Renal Function Panel   Hyperlipidemia associated with type 2 diabetes mellitus ([place])    Maintain crestor dose Repeat lipid panel and check CK       Relevant Orders   Lipid panel   CK     Other   Dizziness    Chronic, waxing and waning CT head 02/2021: no acute finding Echo and carotid duplex completed 03/2021: unremarkable. Had eval by ENT, vestibular rehab was recommended. She has not schedule appt. No syncope, not related to exertion, no palpitation Worse with rapid head movement.  Advised to schedule appt with PT check CBC and renal function       Relevant Orders   CBC with Differential/Platelet   Return in about 3 months (around 06/11/2022) for DM and HTN, hyperlipidemia.     Wilfred Lacy, NP

## 2022-03-11 NOTE — Patient Instructions (Addendum)
Schedule appt with PT. Schedule appt with GI Go to lab Sign medical release to records from [facility] current medication

## 2022-03-11 NOTE — Assessment & Plan Note (Addendum)
Chronic, waxing and waning CT head 02/2021: no acute finding Echo and carotid duplex completed 03/2021: unremarkable. Had eval by ENT, vestibular rehab was recommended. She has not schedule appt. No syncope, not related to exertion, no palpitation Worse with rapid head movement.  Advised to schedule appt with PT check CBC and renal function

## 2022-03-11 NOTE — Assessment & Plan Note (Signed)
BP at goal with metoprolol and losartan BP Readings from Last 3 Encounters:  03/11/22 136/86  02/18/22 (!) 171/87  12/10/21 (!) 160/80   Maintain med doses Repeat renal function F/up in 3-78months

## 2022-03-11 NOTE — Assessment & Plan Note (Signed)
Maintain crestor dose Repeat lipid panel and check CK

## 2022-03-11 NOTE — Assessment & Plan Note (Signed)
Controlled with metformin uptodate with DM eye exam, report requested Maintain med dose F/up in 60months

## 2022-04-06 ENCOUNTER — Other Ambulatory Visit: Payer: Self-pay | Admitting: Gastroenterology

## 2022-04-08 ENCOUNTER — Ambulatory Visit: Payer: PPO

## 2022-06-10 ENCOUNTER — Ambulatory Visit: Payer: PPO | Admitting: Podiatry

## 2022-06-10 DIAGNOSIS — L84 Corns and callosities: Secondary | ICD-10-CM | POA: Diagnosis not present

## 2022-06-10 DIAGNOSIS — E1165 Type 2 diabetes mellitus with hyperglycemia: Secondary | ICD-10-CM | POA: Diagnosis not present

## 2022-06-10 DIAGNOSIS — Q828 Other specified congenital malformations of skin: Secondary | ICD-10-CM

## 2022-06-10 DIAGNOSIS — E119 Type 2 diabetes mellitus without complications: Secondary | ICD-10-CM | POA: Diagnosis not present

## 2022-06-10 DIAGNOSIS — B351 Tinea unguium: Secondary | ICD-10-CM

## 2022-06-11 ENCOUNTER — Ambulatory Visit (INDEPENDENT_AMBULATORY_CARE_PROVIDER_SITE_OTHER): Payer: PPO | Admitting: Nurse Practitioner

## 2022-06-11 ENCOUNTER — Encounter: Payer: Self-pay | Admitting: Nurse Practitioner

## 2022-06-11 VITALS — BP 132/80 | HR 68 | Temp 97.1°F | Ht 61.0 in | Wt 177.2 lb

## 2022-06-11 DIAGNOSIS — E1165 Type 2 diabetes mellitus with hyperglycemia: Secondary | ICD-10-CM

## 2022-06-11 DIAGNOSIS — I10 Essential (primary) hypertension: Secondary | ICD-10-CM

## 2022-06-11 DIAGNOSIS — I700 Atherosclerosis of aorta: Secondary | ICD-10-CM

## 2022-06-11 DIAGNOSIS — E782 Mixed hyperlipidemia: Secondary | ICD-10-CM

## 2022-06-11 LAB — POCT GLYCOSYLATED HEMOGLOBIN (HGB A1C): Hemoglobin A1C: 5.9 % — AB (ref 4.0–5.6)

## 2022-06-11 MED ORDER — LOSARTAN POTASSIUM 100 MG PO TABS: 100.0000 mg | ORAL_TABLET | Freq: Every day | ORAL | 3 refills | Status: DC

## 2022-06-11 MED ORDER — METFORMIN HCL 500 MG PO TABS: 250.0000 mg | ORAL_TABLET | Freq: Every day | ORAL | 3 refills | Status: DC

## 2022-06-11 MED ORDER — ROSUVASTATIN CALCIUM 20 MG PO TABS: 20.0000 mg | ORAL_TABLET | Freq: Every day | ORAL | 3 refills | Status: DC

## 2022-06-11 MED ORDER — METOPROLOL SUCCINATE ER 25 MG PO TB24: 25.0000 mg | ORAL_TABLET | Freq: Every day | ORAL | 3 refills | Status: DC

## 2022-06-11 NOTE — Patient Instructions (Addendum)
hgbA1c at 5.9%: improved from 6.2% Schedule appt for annual DM eye exam. Have report sent to me. Maintain current med doses

## 2022-06-11 NOTE — Assessment & Plan Note (Signed)
Home glucose: 80s-100s Intermittent hypoglycemic symptoms with glucose in 80s. Advised to avoid skipping meals and to maintain a balanced diet. Foot exam completed by podiatry: normal Advised to schedule appt for DM eye exam. Normal urine microalbumin  Repeat hgbA1c POC: 5.9%: improved Maintain metformin dose F/up in 76months

## 2022-06-11 NOTE — Progress Notes (Signed)
Established Patient Visit  Patient: Mary Collier   DOB: 21-Jul-1951   71 y.o. Female  MRN: [id number] Visit Date: 06/11/2022  Subjective:    Chief Complaint  Patient presents with   Office Visit    HTN/ DM/ Hyperlipidemia F/u Checks BP & BS daily No concerns     HPI HTN (hypertension) BP at goal with losartan and metoprolol BP Readings from Last 3 Encounters:  06/11/22 132/80  03/11/22 136/86  02/18/22 (!) 171/87   Maintain med dose  DM (diabetes mellitus) ([place]) Home glucose: 80s-100s Intermittent hypoglycemic symptoms with glucose in 80s. Advised to avoid skipping meals and to maintain a balanced diet. Foot exam completed by podiatry: normal Advised to schedule appt for DM eye exam. Normal urine microalbumin  Repeat hgbA1c POC: 5.9%: improved Maintain metformin dose F/up in 100months  Reviewed medical, surgical, and social history today  Medications: Outpatient Medications Prior to Visit  Medication Sig   acetaminophen (TYLENOL) 325 MG tablet Take 325 mg by mouth as needed.   calcium-vitamin D (OSCAL 500/200 D-3) 500-200 MG-UNIT per tablet Take 1 tablet by mouth 2 (two) times daily.   CVS SENNA PLUS 8.6-50 MG tablet TAKE 1 TABLET BY MOUTH DAILY.   diclofenac sodium (VOLTAREN) 1 % GEL APPLY 2 G TOPICALLY 3 (THREE) TIMES DAILY AS NEEDED.   diclofenac Sodium (VOLTAREN) 1 % GEL    dimenhyDRINATE (DRAMAMINE) 50 MG tablet Take 50 mg by mouth every 8 (eight) hours as needed for dizziness.   fluticasone (FLONASE) 50 MCG/ACT nasal spray SPRAY 2 SPRAYS INTO EACH NOSTRIL EVERY DAY   folic acid (FOLVITE) 1 MG tablet Take 1 mg by mouth daily.   hydrocortisone (ANUSOL-HC) 25 MG suppository Use one suppository twice a day for 10 days then as needed   ipratropium (ATROVENT) 0.03 % nasal spray    LUMIGAN 0.01 % SOLN 1 drop at bedtime.   nortriptyline (PAMELOR) 25 MG capsule Take 1 capsule (25 mg total) by mouth at bedtime.   omeprazole (PRILOSEC) 40 MG capsule TAKE 1  CAPSULE BY MOUTH EVERY DAY   RESTASIS 0.05 % ophthalmic emulsion 1 drop 2 (two) times daily.   Travoprost, BAK Free, (TRAVATAN) 0.004 % SOLN ophthalmic solution 1 drop at bedtime.    [DISCONTINUED] losartan (COZAAR) 50 MG tablet Take 50 mg by mouth daily.   [DISCONTINUED] metFORMIN (GLUCOPHAGE) 500 MG tablet Take 0.5 tablets (250 mg total) by mouth daily with breakfast.   [DISCONTINUED] metoprolol succinate (TOPROL-XL) 25 MG 24 hr tablet Take 1 tablet (25 mg total) by mouth daily.   [DISCONTINUED] rosuvastatin (CRESTOR) 20 MG tablet Take 1 tablet (20 mg total) by mouth daily.   dicyclomine (BENTYL) 20 MG tablet TAKE 1 TABLET (20 MG TOTAL) BY MOUTH 4 (FOUR) TIMES DAILY - BEFORE MEALS AND AT BEDTIME.   [DISCONTINUED] atorvastatin (LIPITOR) 10 MG tablet  (Patient not taking: Reported on 06/11/2022)   [DISCONTINUED] CVS E 200 units capsule Take 200 Units by mouth daily. (Patient not taking: Reported on 06/11/2022)   [DISCONTINUED] losartan (COZAAR) 100 MG tablet Take 1 tablet (100 mg total) by mouth daily. (Patient not taking: Reported on 06/11/2022)   No facility-administered medications prior to visit.   Reviewed past medical and social history.   ROS per HPI above      Objective:  BP 132/80 Comment: home BP reading this AM  Pulse 68   Temp (!) 97.1 F (36.2 C) (Temporal)  Ht 5\' 1"  (1.549 m)   Wt 177 lb 3.2 oz (80.4 kg)   SpO2 99%   BMI 33.48 kg/m      Physical Exam Cardiovascular:     Rate and Rhythm: Normal rate and regular rhythm.     Pulses: Normal pulses.     Heart sounds: Normal heart sounds.  Pulmonary:     Effort: Pulmonary effort is normal.     Breath sounds: Normal breath sounds.  Neurological:     Mental Status: She is alert and oriented to person, place, and time.  Psychiatric:        Mood and Affect: Mood normal.        Behavior: Behavior normal.     Results for orders placed or performed in visit on 06/11/22  POCT glycosylated hemoglobin (Hb A1C)  Result  Value Ref Range   Hemoglobin A1C 5.9 (A) 4.0 - 5.6 %      Assessment & Plan:    Problem List Items Addressed This Visit       Cardiovascular and Mediastinum   Aortic atherosclerosis (HCC)   Relevant Medications   metoprolol succinate (TOPROL-XL) 25 MG 24 hr tablet   rosuvastatin (CRESTOR) 20 MG tablet   losartan (COZAAR) 100 MG tablet   HTN (hypertension)    BP at goal with losartan and metoprolol BP Readings from Last 3 Encounters:  06/11/22 132/80  03/11/22 136/86  02/18/22 (!) 171/87   Maintain med dose      Relevant Medications   metoprolol succinate (TOPROL-XL) 25 MG 24 hr tablet   rosuvastatin (CRESTOR) 20 MG tablet   losartan (COZAAR) 100 MG tablet     Endocrine   DM (diabetes mellitus) ([place]) - Primary    Home glucose: 80s-100s Intermittent hypoglycemic symptoms with glucose in 80s. Advised to avoid skipping meals and to maintain a balanced diet. Foot exam completed by podiatry: normal Advised to schedule appt for DM eye exam. Normal urine microalbumin  Repeat hgbA1c POC: 5.9%: improved Maintain metformin dose F/up in 80months      Relevant Medications   rosuvastatin (CRESTOR) 20 MG tablet   losartan (COZAAR) 100 MG tablet   metFORMIN (GLUCOPHAGE) 500 MG tablet   Other Relevant Orders   POCT glycosylated hemoglobin (Hb A1C) (Completed)   Other Visit Diagnoses     Mixed hyperlipidemia       Relevant Medications   metoprolol succinate (TOPROL-XL) 25 MG 24 hr tablet   rosuvastatin (CRESTOR) 20 MG tablet   losartan (COZAAR) 100 MG tablet      Return in about 3 months (around 09/11/2022) for DM, HTN, hyperlipidemia (fasting).     Wilfred Lacy, NP

## 2022-06-11 NOTE — Assessment & Plan Note (Signed)
BP at goal with losartan and metoprolol BP Readings from Last 3 Encounters:  06/11/22 132/80  03/11/22 136/86  02/18/22 (!) 171/87   Maintain med dose

## 2022-06-12 ENCOUNTER — Telehealth: Payer: Self-pay

## 2022-06-12 NOTE — Telephone Encounter (Signed)
Per Mary Collier, Pt has been interested in the [facility] Vaccine when it becomes available. Could we add this pt to the waiting list if there is one? Thank you

## 2022-06-17 ENCOUNTER — Encounter: Payer: Self-pay | Admitting: Podiatry

## 2022-06-17 NOTE — Progress Notes (Signed)
ANNUAL DIABETIC FOOT EXAM  Subjective: Mary Collier presents today for annual diabetic foot examination and painful porokeratotic lesion(s) b/l lower extremities and painful mycotic toenails that limit ambulation. Painful toenails interfere with ambulation. Aggravating factors include wearing enclosed shoe gear. Pain is relieved with periodic professional debridement. Painful porokeratotic lesions are aggravated when weightbearing with and without shoegear. Pain is relieved with periodic professional debridement..  Patient confirms h/o diabetes.  Patient denies any h/o foot wounds.  Patient denies any numbness, tingling, burning, or pins/needle sensation in feet.  Risk factors: diabetes, HTN, hyperlipidemia.  Mary Collier, Mary Brooke, NP is patient's PCP. Last visit was Mar 11, 2022.  Past Medical History:  Diagnosis Date   Allergic rhinitis    Allergy    Arthritis    Asthma    Blood transfusion without reported diagnosis    1966   Cancer ([place])    at age 3.  abdominal   Cancer of kidney ([place])    at age 80.  Right kidney.   Chronic headache    Edema    GERD (gastroesophageal reflux disease)    Glaucoma    Hiatal hernia    Hypertension    IBS (irritable bowel syndrome)    Internal hemorrhoid    Lumbago    Lump or mass in breast    Lung cancer (HCC)    at age 72  Right lung.   Other abnormal blood chemistry    Other and unspecified hyperlipidemia    Other convulsions    hx seizures at age 58   Peptic stricture of esophagus    Reflux esophagitis    Tubular adenoma of colon 2015   Unspecified menopausal and postmenopausal disorder    Unspecified vitamin D deficiency    Patient Active Problem List   Diagnosis Date Noted   Dizziness 03/11/2022   Left flank pain, chronic 12/10/2021   Memory difficulty 12/10/2021   Renal cyst, left 03/13/2021   Aortic atherosclerosis ([place]) 03/13/2021   Mild cardiomegaly 03/13/2021   Dysuria 05/11/2020   Chronic paroxysmal hemicrania  02/14/2020   Postmenopausal estrogen deficiency 06/15/2019   GERD (gastroesophageal reflux disease) 07/12/2014   Fibromyalgia 04/27/2013   Special screening for malignant neoplasms, colon 03/30/2013   Vitamin D deficiency 03/30/2013   HTN (hypertension) 03/30/2013   Hyperlipidemia associated with type 2 diabetes mellitus ([place]) 03/30/2013   Constipation 03/30/2013   Routine general medical examination at a health care facility 03/30/2013   DM (diabetes mellitus) ([place]) 03/30/2013   Pulmonary nodule 04/16/2011   Past Surgical History:  Procedure Laterality Date   ABDOMINAL SURGERY  at age 53    BREAST SURGERY  1970s and 1980s   Bilateral cysts removed   KIDNEY SURGERY  at age 34   Right   LUNG SURGERY  at age 24   Right   [place]  2000s   Current Outpatient Medications on File Prior to Visit  Medication Sig Dispense Refill   acetaminophen (TYLENOL) 325 MG tablet Take 325 mg by mouth as needed.     calcium-vitamin D (OSCAL 500/200 D-3) 500-200 MG-UNIT per tablet Take 1 tablet by mouth 2 (two) times daily. 60 tablet 12   [facility] SENNA PLUS 8.6-50 MG tablet TAKE 1 TABLET BY MOUTH DAILY. 90 tablet 1   diclofenac sodium (VOLTAREN) 1 % GEL APPLY 2 G TOPICALLY 3 (THREE) TIMES DAILY AS NEEDED. 100 g 0   diclofenac Sodium (VOLTAREN)  1 % GEL      dicyclomine (BENTYL) 20 MG tablet TAKE 1 TABLET (20 MG TOTAL) BY MOUTH 4 (FOUR) TIMES DAILY - BEFORE MEALS AND AT BEDTIME. 360 tablet 0   dimenhyDRINATE (DRAMAMINE) 50 MG tablet Take 50 mg by mouth every 8 (eight) hours as needed for dizziness.     fluticasone (FLONASE) 50 MCG/ACT nasal spray SPRAY 2 SPRAYS INTO EACH NOSTRIL EVERY DAY 48 mL 0   folic acid (FOLVITE) 1 MG tablet Take 1 mg by mouth daily.     hydrocortisone (ANUSOL-HC) 25 MG suppository Use one suppository twice a day for 10 days then as needed 28 suppository 1   ipratropium (ATROVENT) 0.03 % nasal spray      LUMIGAN  0.01 % SOLN 1 drop at bedtime.     nortriptyline (PAMELOR) 25 MG capsule Take 1 capsule (25 mg total) by mouth at bedtime. 90 capsule 3   omeprazole (PRILOSEC) 40 MG capsule TAKE 1 CAPSULE BY MOUTH EVERY DAY 90 capsule 1   RESTASIS 0.05 % ophthalmic emulsion 1 drop 2 (two) times daily.     Travoprost, BAK Free, (TRAVATAN) 0.004 % SOLN ophthalmic solution 1 drop at bedtime.      No current facility-administered medications on file prior to visit.    Allergies  Allergen Reactions   Influenza Vaccines     D/t being allergic to eggs   Shrimp [Shellfish Allergy] Swelling    Lip, throat, tongue swelling   Other Itching    Newspaper Dye   Celecoxib     Able to take IBU   Codeine Swelling   Compazine [Prochlorperazine]    Gabapentin Other (See Comments)    Dizziness and somnolence   Iodine     "Has shellfish allergy, would prefer not to take"   Lisinopril Cough    cough   Peanut-Containing Drug Products     Itching   Prednisone     Rapid heart rate   Prochlorperazine Edisylate     paralyzed   Robinul [Glycopyrrolate]     boils   Shrimp Extract Allergy Skin Test     Other reaction(s): Not available   Vioxx [Rofecoxib]     States able to take IBU   Aspirin Rash    States able to take IBU   Eggs Or Egg-Derived Products Rash   Epinephrine Rash   Erythromycin Rash   Sulfa Antibiotics Rash   Tetracycline Rash   Social History   Occupational History   Occupation: Investment banker, corporate (preschool)   Tobacco Use   Smoking status: Never   Smokeless tobacco: Never  Vaping Use   Vaping Use: Never used  Substance and Sexual Activity   Alcohol use: No   Drug use: No   Sexual activity: Not on file   Family History  Problem Relation Age of Onset   Allergies Sister        x3   Asthma Sister    Bone cancer Father    Lung cancer Father    Stomach cancer Father    Esophageal cancer Father    Breast cancer Mother    Prostate cancer Brother    Pancreatic cancer Brother         mets from prostate   Colon cancer Brother    Hypertension Brother    Prostate cancer Brother    Bone cancer Brother    Diabetes Brother    Heart disease Sister    Diabetes Sister    Hypertension Sister  Breast cancer Sister        mastectomy   Stroke Sister    Rectal cancer Neg Hx    Liver cancer Neg Hx    Immunization History  Administered Date(s) Administered   PFIZER(Purple Top)SARS-COV-2 Vaccination 12/16/2019, 01/07/2020, 08/08/2020   Pfizer Covid-19 Vaccine Bivalent Booster 55yrs & up 08/12/2021   Pneumococcal Conjugate-13 04/11/2014   Pneumococcal Polysaccharide-23 04/08/2017   Tdap 02/14/2020    Review of Systems: Negative except as noted in the HPI.   Objective: There were no vitals filed for this visit.  Mary Collier is a pleasant 71 y.o. female in NAD. AAO X 3.  Vascular Examination: Vascular status intact b/l with palpable pedal pulses. Pedal hair sparse b/l. CFT immediate b/l. No edema. No pain with calf compression b/l. Skin temperature gradient WNL b/l. No cyanosis or clubbing noted b/l LE.  Neurological Examination: Sensation grossly intact b/l with 10 gram monofilament. Vibratory sensation intact b/l.   Dermatological Examination: Pedal skin with normal turgor, texture and tone b/l. Toenails 2-5 b/l thick, discolored, elongated with subungual debris and pain on dorsal palpation. Anonychia noted bilateral great toes. Nailbed(s) epithelialized.  Hyperkeratotic lesion(s) submet head 5 left foot.  No erythema, no edema, no drainage, no fluctuance. Porokeratotic lesion(s) submet head 2 left foot and submet head 5 right foot. No erythema, no edema, no drainage, no fluctuance.  Musculoskeletal Examination: Muscle strength 5/5 to b/l LE. No pain, crepitus or joint limitation noted with ROM bilateral LE. No gross bony deformities bilaterally. Patient ambulates independent of any assistive aids.  Radiographs: None  Last A1c:  6.2%  Footwear Assessment: Does  the patient wear appropriate shoes? Yes. Does the patient need inserts/orthotics? No.  ADA Risk Categorization: Low Risk :  Patient has all of the following: Intact protective sensation No prior foot ulcer  No severe deformity Pedal pulses present  Assessment: 1. Pain due to onychomycosis of toenails of both feet   2. Porokeratosis   3. Callus   4. Type 2 diabetes mellitus with hyperglycemia, without long-term current use of insulin (HCC)   5. Encounter for diabetic foot exam [facility])     Plan: -Patient was evaluated and treated. All patient's and/or POA's questions/concerns answered on today's visit. -Diabetic foot examination performed today. -Continue foot and shoe inspections daily. Monitor blood glucose per PCP/Endocrinologist's recommendations. -Patient to continue soft, supportive shoe gear daily. -Mycotic toenails 2-5 bilaterally were debrided in length and girth with sterile nail nippers and dremel without iatrogenic bleeding. -Callus(es) submet head 5 left foot pared utilizing sterile scalpel blade without complication or incident. Total number debrided =1. -Porokeratotic lesion(s) submet head 2 left foot and submet head 5 right foot pared and enucleated with sterile scalpel blade without incident. Total number of lesions debrided=2. -Patient/POA to call should there be question/concern in the interim. Return in about 3 months (around 09/10/2022).  Marzetta Board, DPM

## 2022-06-18 NOTE — Telephone Encounter (Signed)
Pt advised.

## 2022-07-02 ENCOUNTER — Other Ambulatory Visit: Payer: Self-pay | Admitting: Gastroenterology

## 2022-07-02 DIAGNOSIS — K6289 Other specified diseases of anus and rectum: Secondary | ICD-10-CM

## 2022-07-07 ENCOUNTER — Other Ambulatory Visit: Payer: Self-pay | Admitting: Gastroenterology

## 2022-07-07 DIAGNOSIS — K6289 Other specified diseases of anus and rectum: Secondary | ICD-10-CM

## 2022-07-22 ENCOUNTER — Other Ambulatory Visit (INDEPENDENT_AMBULATORY_CARE_PROVIDER_SITE_OTHER): Payer: PPO

## 2022-07-22 ENCOUNTER — Encounter: Payer: Self-pay | Admitting: Gastroenterology

## 2022-07-22 ENCOUNTER — Ambulatory Visit: Payer: PPO | Admitting: Gastroenterology

## 2022-07-22 VITALS — BP 162/110 | HR 50 | Ht 61.0 in | Wt 176.0 lb

## 2022-07-22 DIAGNOSIS — R197 Diarrhea, unspecified: Secondary | ICD-10-CM

## 2022-07-22 DIAGNOSIS — K6289 Other specified diseases of anus and rectum: Secondary | ICD-10-CM | POA: Diagnosis not present

## 2022-07-22 LAB — CBC WITH DIFFERENTIAL/PLATELET
Basophils Absolute: 0 10*3/uL (ref 0.0–0.1)
Basophils Relative: 0.6 % (ref 0.0–3.0)
Eosinophils Absolute: 0.1 10*3/uL (ref 0.0–0.7)
Eosinophils Relative: 1.7 % (ref 0.0–5.0)
HCT: 37.9 % (ref 36.0–46.0)
Hemoglobin: 12.7 g/dL (ref 12.0–15.0)
Lymphocytes Relative: 20.4 % (ref 12.0–46.0)
Lymphs Abs: 1.4 10*3/uL (ref 0.7–4.0)
MCHC: 33.6 g/dL (ref 30.0–36.0)
MCV: 87.3 fl (ref 78.0–100.0)
Monocytes Absolute: 0.7 10*3/uL (ref 0.1–1.0)
Monocytes Relative: 9.7 % (ref 3.0–12.0)
Neutro Abs: 4.7 10*3/uL (ref 1.4–7.7)
Neutrophils Relative %: 67.6 % (ref 43.0–77.0)
Platelets: 198 10*3/uL (ref 150.0–400.0)
RBC: 4.33 Mil/uL (ref 3.87–5.11)
RDW: 13.8 % (ref 11.5–15.5)
WBC: 6.9 10*3/uL (ref 4.0–10.5)

## 2022-07-22 LAB — COMPREHENSIVE METABOLIC PANEL
ALT: 12 U/L (ref 0–35)
AST: 14 U/L (ref 0–37)
Albumin: 4.1 g/dL (ref 3.5–5.2)
Alkaline Phosphatase: 67 U/L (ref 39–117)
BUN: 17 mg/dL (ref 6–23)
CO2: 26 mEq/L (ref 19–32)
Calcium: 9.4 mg/dL (ref 8.4–10.5)
Chloride: 105 mEq/L (ref 96–112)
Creatinine, Ser: 0.91 mg/dL (ref 0.40–1.20)
GFR: 63.83 mL/min (ref >60.00)
Glucose, Bld: 106 mg/dL — ABNORMAL HIGH (ref 70–99)
Potassium: 4.2 mEq/L (ref 3.5–5.1)
Sodium: 140 mEq/L (ref 135–145)
Total Bilirubin: 0.3 mg/dL (ref 0.2–1.2)
Total Protein: 7.8 g/dL (ref 6.0–8.3)

## 2022-07-22 LAB — TSH: TSH: 4.35 u[IU]/mL (ref 0.35–5.50)

## 2022-07-22 MED ORDER — HYOSCYAMINE SULFATE ER 0.375 MG PO TB12: 0.3750 mg | ORAL_TABLET | Freq: Two times a day (BID) | ORAL | 11 refills | Status: DC

## 2022-07-22 MED ORDER — HYDROCORTISONE ACETATE 25 MG RE SUPP: RECTAL | 1 refills | Status: DC

## 2022-07-22 NOTE — Progress Notes (Signed)
    Assessment     Diarrhea alternating with constipation, incontinence associated with urgency, mild LUQ abdominal pain, nausea - suspected IBS Intermittent rectal pain likely due to internal hemorrhoids Personal history of adenomatous colon polyps History of H. pylori gastritis, treated in 2018   Recommendations    Discontinue dicyclomine  Start Levbid 0.375 mg po bid, Imodium 1-2 po tid prn and Gas-X tid prn  GI stool profile, CBC, CMP, TSH, IgA, tTG Low FODMAP diet Surveillance colonoscopy recommended in November 2027 REV in 6 weeks   HPI    This is a 71 year old female with diarrhea alternating with constipation, intermittent fecal incontinence associated with urgency, intermittent nausea, intermittent  LUQ pain and gas.  She also has intermittent rectal pain that is relieved with Anusol HC suppositories. She relates that her symptoms have been bothersome for 2 months.  She relates intolerance to milk products with looser stools and intestinal gas so she avoids them.  She does not note any other food intolerances.  She states she stopped metformin for 1 week without a change in her bowel pattern.  Colon Nov 2020 - Four 6 to 7 mm polyps in the descending colon and in the ascending colon, removed with a cold snare. Resected and retrieved. - Mild diverticulosis in the left colon. - Internal hemorrhoids. - The examination was otherwise normal on direct and retroflexion views.  EGD July 2018 - Normal esophagus. - Gastritis. Biopsied. - Normal duodenal bulb and second portion of the duodenum. Path: H pylori gastritis   Labs / Imaging       Latest Ref Rng & Units 03/11/2022   10:52 AM 12/12/2021    9:12 AM 02/05/2021   10:24 AM  Hepatic Function  Total Protein 6.0 - 8.3 g/dL  7.4  8.2   Albumin 3.5 - 5.2 g/dL 4.2  4.1  4.1   AST 0 - 37 U/L  14  16   ALT 0 - 35 U/L  13  16   Alk Phosphatase 39 - 117 U/L  67  80   Total Bilirubin 0.2 - 1.2 mg/dL  0.4  0.3   Bilirubin,  Direct 0.0 - 0.3 mg/dL  0.1         Latest Ref Rng & Units 03/11/2022   10:52 AM 02/05/2021   10:24 AM 10/12/2019    8:25 AM  CBC  WBC 4.0 - 10.5 K/uL 7.3  8.5  7.8   Hemoglobin 12.0 - 15.0 g/dL 12.4  13.1  12.1   Hematocrit 36.0 - 46.0 % 37.8  38.4  37.0   Platelets 150.0 - 400.0 K/uL 204.0  222.0  213.0     Current Medications, Allergies, Past Medical History, Past Surgical History, Family History and Social History were reviewed in [employer] record.   Physical Exam: General: Well developed, well nourished, no acute distress Head: Normocephalic and atraumatic Eyes: Sclerae anicteric, EOMI Ears: Normal auditory acuity Mouth: Not examined Lungs: Clear throughout to auscultation Heart: Regular rate and rhythm; no murmurs, rubs or bruits Abdomen: Soft, non tender and non distended. No masses, hepatosplenomegaly or hernias noted. Normal Bowel sounds Rectal: Not done Musculoskeletal: Symmetrical with no gross deformities  Pulses:  Normal pulses noted Extremities: No clubbing, cyanosis, edema or deformities noted Neurological: Alert oriented x 4, grossly nonfocal Psychological:  Alert and cooperative. Normal mood and affect   Ashiah Karpowicz T. Fuller Plan, MD 07/22/2022, 8:30 AM

## 2022-07-22 NOTE — Patient Instructions (Signed)
Your provider has requested that you go to the basement level for lab work before leaving today. Press "B" on the elevator. The lab is located at the first door on the left as you exit the elevator.  Stop taking dicyclomine.   We have sent the following medications to your pharmacy for you to pick up at your convenience: Levbid and Anusol suppositories.   Also, take over the counter Imodium 2 tablets three times a day for diarrhea.   Follow the Low-fodmap diet provided.   The [facility] providers would like to encourage you to use [facility] to communicate with providers for non-urgent requests or questions.  Due to long hold times on the telephone, sending your provider a message by [facility] may be a faster and more efficient way to get a response.  Please allow 48 business hours for a response.  Please remember that this is for non-urgent requests.   Due to recent changes in healthcare laws, you may see the results of your imaging and laboratory studies on MyChart before your provider has had a chance to review them.  We understand that in some cases there may be results that are confusing or concerning to you. Not all laboratory results come back in the same time frame and the provider may be waiting for multiple results in order to interpret others.  Please give [place] 48 hours in order for your provider to thoroughly review all the results before contacting the office for clarification of your results.   Thank you for choosing me and [facility].  Pricilla Riffle. Dagoberto Ligas., MD., [facility]

## 2022-07-23 ENCOUNTER — Telehealth: Payer: Self-pay | Admitting: Gastroenterology

## 2022-07-23 LAB — TISSUE TRANSGLUTAMINASE, IGA: (tTG) Ab, IgA: <1 U/mL

## 2022-07-23 LAB — IGA: Immunoglobulin A: 266 mg/dL (ref 70–320)

## 2022-07-23 NOTE — Telephone Encounter (Signed)
Refer to lab result note 07/22/22.

## 2022-07-23 NOTE — Telephone Encounter (Signed)
Patient is returning you phone call regarding CBC labs results.Please advise

## 2022-07-24 NOTE — Telephone Encounter (Signed)
Patient called states she received a call from you. Requesting a call back.

## 2022-07-24 NOTE — Telephone Encounter (Signed)
Refer to lab result 07/22/22.

## 2022-07-28 ENCOUNTER — Telehealth: Payer: Self-pay | Admitting: Gastroenterology

## 2022-07-28 NOTE — Telephone Encounter (Signed)
Patient called states she would like to speak to you regarding the medications she was given on 10/3. Requesting a call back. Please call to advise.

## 2022-07-28 NOTE — Telephone Encounter (Signed)
Patient called in stating her Levbid & Anusol suppositories are going to cost $200 each. I was able to send her a good rx coupon for the Levbid the get the price lower, however there is not one for the Anusol suppositories. Will route to MD to see if there is an alternative that can be sent in.

## 2022-07-28 NOTE — Telephone Encounter (Signed)
OTC Preparation H supp, coat each supp with OTC 1% hydrocortisone cream and insert PR

## 2022-07-28 NOTE — Telephone Encounter (Signed)
Spoke with patient regarding MD recommendations. Pt verbalized all understanding. 

## 2022-07-29 ENCOUNTER — Other Ambulatory Visit: Payer: PPO

## 2022-07-29 DIAGNOSIS — R197 Diarrhea, unspecified: Secondary | ICD-10-CM

## 2022-07-29 DIAGNOSIS — K6289 Other specified diseases of anus and rectum: Secondary | ICD-10-CM

## 2022-07-31 LAB — GI PROFILE, STOOL, PCR

## 2022-08-05 LAB — CALPROTECTIN, FECAL: Calprotectin, Fecal: 80 ug/g (ref 0–120)

## 2022-08-25 ENCOUNTER — Ambulatory Visit (INDEPENDENT_AMBULATORY_CARE_PROVIDER_SITE_OTHER): Payer: PPO

## 2022-08-25 VITALS — Ht 61.0 in | Wt 176.0 lb

## 2022-08-25 DIAGNOSIS — Z0000 Encounter for general adult medical examination without abnormal findings: Secondary | ICD-10-CM

## 2022-08-25 NOTE — Patient Instructions (Signed)
Ms. Mary Collier , Thank you for taking time to come for your Medicare Wellness Visit. I appreciate your ongoing commitment to your health goals. Please review the following plan we discussed and let me know if I can assist you in the future.   Screening recommendations/referrals: Colonoscopy: completed 09/06/2019, due 09/05/2024 Mammogram: completed 10/28/2021, due 10/29/2022 Bone Density: completed 07/14/2019 Recommended yearly ophthalmology/optometry visit for glaucoma screening and checkup Recommended yearly dental visit for hygiene and checkup  Vaccinations: Influenza vaccine: allergy Pneumococcal vaccine: completed 04/08/2017 Tdap vaccine: completed 02/14/2020, due 02/13/2030 Shingles vaccine: discussed   Covid-19: 08/12/2021, 08/08/2020, 01/07/2020, 12/16/2019  Advanced directives: Advance directive discussed with you today.  Conditions/risks identified: none  Next appointment: Follow up in one year for your annual wellness visit    Preventive Care 65 Years and Older, Female Preventive care refers to lifestyle choices and visits with your health care provider that can promote health and wellness. What does preventive care include? A yearly physical exam. This is also called an annual well check. Dental exams once or twice a year. Routine eye exams. Ask your health care provider how often you should have your eyes checked. Personal lifestyle choices, including: Daily care of your teeth and gums. Regular physical activity. Eating a healthy diet. Avoiding tobacco and drug use. Limiting alcohol use. Practicing safe sex. Taking low-dose aspirin every day. Taking vitamin and mineral supplements as recommended by your health care provider. What happens during an annual well check? The services and screenings done by your health care provider during your annual well check will depend on your age, overall health, lifestyle risk factors, and family history of disease. Counseling  Your health  care provider may ask you questions about your: Alcohol use. Tobacco use. Drug use. Emotional well-being. Home and relationship well-being. Sexual activity. Eating habits. History of falls. Memory and ability to understand (cognition). Work and work Statistician. Reproductive health. Screening  You may have the following tests or measurements: Height, weight, and BMI. Blood pressure. Lipid and cholesterol levels. These may be checked every 5 years, or more frequently if you are over 39 years old. Skin check. Lung cancer screening. You may have this screening every year starting at age 58 if you have a 30-pack-year history of smoking and currently smoke or have quit within the past 15 years. Fecal occult blood test (FOBT) of the stool. You may have this test every year starting at age 19. Flexible sigmoidoscopy or colonoscopy. You may have a sigmoidoscopy every 5 years or a colonoscopy every 10 years starting at age 55. Hepatitis C blood test. Hepatitis B blood test. Sexually transmitted disease (STD) testing. Diabetes screening. This is done by checking your blood sugar (glucose) after you have not eaten for a while (fasting). You may have this done every 1-3 years. Bone density scan. This is done to screen for osteoporosis. You may have this done starting at age 24. Mammogram. This may be done every 1-2 years. Talk to your health care provider about how often you should have regular mammograms. Talk with your health care provider about your test results, treatment options, and if necessary, the need for more tests. Vaccines  Your health care provider may recommend certain vaccines, such as: Influenza vaccine. This is recommended every year. Tetanus, diphtheria, and acellular pertussis (Tdap, Td) vaccine. You may need a Td booster every 10 years. Zoster vaccine. You may need this after age 58. Pneumococcal 13-valent conjugate (PCV13) vaccine. One dose is recommended after age  6. Pneumococcal polysaccharide (  PPSV23) vaccine. One dose is recommended after age 69. Talk to your health care provider about which screenings and vaccines you need and how often you need them. This information is not intended to replace advice given to you by your health care provider. Make sure you discuss any questions you have with your health care provider. Document Released: 11/02/2015 Document Revised: 06/25/2016 Document Reviewed: 08/07/2015 Elsevier Interactive Patient Education  2017 [place] Prevention in the Home Falls can cause injuries. They can happen to people of all ages. There are many things you can do to make your home safe and to help prevent falls. What can I do on the outside of my home? Regularly fix the edges of walkways and driveways and fix any cracks. Remove anything that might make you trip as you walk through a door, such as a raised step or threshold. Trim any bushes or trees on the path to your home. Use bright outdoor lighting. Clear any walking paths of anything that might make someone trip, such as rocks or tools. Regularly check to see if handrails are loose or broken. Make sure that both sides of any steps have handrails. Any raised decks and porches should have guardrails on the edges. Have any leaves, snow, or ice cleared regularly. Use sand or salt on walking paths during winter. Clean up any spills in your garage right away. This includes oil or grease spills. What can I do in the bathroom? Use night lights. Install grab bars by the toilet and in the tub and shower. Do not use towel bars as grab bars. Use non-skid mats or decals in the tub or shower. If you need to sit down in the shower, use a plastic, non-slip stool. Keep the floor dry. Clean up any water that spills on the floor as soon as it happens. Remove soap buildup in the tub or shower regularly. Attach bath mats securely with double-sided non-slip rug tape. Do not have throw  rugs and other things on the floor that can make you trip. What can I do in the bedroom? Use night lights. Make sure that you have a light by your bed that is easy to reach. Do not use any sheets or blankets that are too big for your bed. They should not hang down onto the floor. Have a firm chair that has side arms. You can use this for support while you get dressed. Do not have throw rugs and other things on the floor that can make you trip. What can I do in the kitchen? Clean up any spills right away. Avoid walking on wet floors. Keep items that you use a lot in easy-to-reach places. If you need to reach something above you, use a strong step stool that has a grab bar. Keep electrical cords out of the way. Do not use floor polish or wax that makes floors slippery. If you must use wax, use non-skid floor wax. Do not have throw rugs and other things on the floor that can make you trip. What can I do with my stairs? Do not leave any items on the stairs. Make sure that there are handrails on both sides of the stairs and use them. Fix handrails that are broken or loose. Make sure that handrails are as long as the stairways. Check any carpeting to make sure that it is firmly attached to the stairs. Fix any carpet that is loose or worn. Avoid having throw rugs at the top or bottom  of the stairs. If you do have throw rugs, attach them to the floor with carpet tape. Make sure that you have a light switch at the top of the stairs and the bottom of the stairs. If you do not have them, ask someone to add them for you. What else can I do to help prevent falls? Wear shoes that: Do not have high heels. Have rubber bottoms. Are comfortable and fit you well. Are closed at the toe. Do not wear sandals. If you use a stepladder: Make sure that it is fully opened. Do not climb a closed stepladder. Make sure that both sides of the stepladder are locked into place. Ask someone to hold it for you, if  possible. Clearly mark and make sure that you can see: Any grab bars or handrails. First and last steps. Where the edge of each step is. Use tools that help you move around (mobility aids) if they are needed. These include: Canes. Walkers. Scooters. Crutches. Turn on the lights when you go into a dark area. Replace any light bulbs as soon as they burn out. Set up your furniture so you have a clear path. Avoid moving your furniture around. If any of your floors are uneven, fix them. If there are any pets around you, be aware of where they are. Review your medicines with your doctor. Some medicines can make you feel dizzy. This can increase your chance of falling. Ask your doctor what other things that you can do to help prevent falls. This information is not intended to replace advice given to you by your health care provider. Make sure you discuss any questions you have with your health care provider. Document Released: 08/02/2009 Document Revised: 03/13/2016 Document Reviewed: 11/10/2014 Elsevier Interactive Patient Education  2017 [employer].

## 2022-08-25 NOTE — Progress Notes (Signed)
I connected with Ginger Carne today by telephone and verified that I am speaking with the correct person using two identifiers. Location patient: home Location provider: work Persons participating in the virtual visit: Saige Canton, Glenna Durand LPN.   I discussed the limitations, risks, security and privacy concerns of performing an evaluation and management service by telephone and the availability of in person appointments. I also discussed with the patient that there may be a patient responsible charge related to this service. The patient expressed understanding and verbally consented to this telephonic visit.    Interactive audio and video telecommunications were attempted between this provider and patient, however failed, due to patient having technical difficulties OR patient did not have access to video capability.  We continued and completed visit with audio only.     Vital signs may be patient reported or missing.  Subjective:   Mary Collier is a 71 y.o. female who presents for an Initial Medicare Annual Wellness Visit.  Review of Systems     Cardiac Risk Factors include: advanced age (>3men, >19 women);diabetes mellitus;dyslipidemia;hypertension;obesity (BMI >30kg/m2)     Objective:    Today's Vitals   08/25/22 0926 08/25/22 0927  Weight: 176 lb (79.8 kg)   Height: 5\' 1"  (1.549 m)   PainSc:  8    Body mass index is 33.25 kg/m.     08/25/2022    9:38 AM 02/18/2022    9:15 AM 01/18/2021    8:28 AM 08/06/2020   10:26 AM 03/21/2020   10:28 AM 11/14/2019   11:11 AM 05/04/2017    2:53 PM  Advanced Directives  Does Patient Have a Medical Advance Directive? No No No No No No No    Current Medications (verified) Outpatient Encounter Medications as of 08/25/2022  Medication Sig   acetaminophen (TYLENOL) 325 MG tablet Take 325 mg by mouth as needed.   calcium-vitamin D (OSCAL 500/200 D-3) 500-200 MG-UNIT per tablet Take 1 tablet by mouth 2 (two) times daily.   [facility] SENNA  PLUS 8.6-50 MG tablet TAKE 1 TABLET BY MOUTH DAILY.   diclofenac sodium (VOLTAREN) 1 % GEL APPLY 2 G TOPICALLY 3 (THREE) TIMES DAILY AS NEEDED.   dimenhyDRINATE (DRAMAMINE) 50 MG tablet Take 50 mg by mouth every 8 (eight) hours as needed for dizziness.   fluticasone (FLONASE) 50 MCG/ACT nasal spray SPRAY 2 SPRAYS INTO EACH NOSTRIL EVERY DAY   folic acid (FOLVITE) 1 MG tablet Take 1 mg by mouth daily.   losartan (COZAAR) 100 MG tablet Take 1 tablet (100 mg total) by mouth daily.   LUMIGAN 0.01 % SOLN 1 drop at bedtime.   metFORMIN (GLUCOPHAGE) 500 MG tablet Take 0.5 tablets (250 mg total) by mouth daily with breakfast.   metoprolol succinate (TOPROL-XL) 25 MG 24 hr tablet Take 1 tablet (25 mg total) by mouth daily.   nortriptyline (PAMELOR) 25 MG capsule Take 1 capsule (25 mg total) by mouth at bedtime.   omeprazole (PRILOSEC) 40 MG capsule TAKE 1 CAPSULE BY MOUTH EVERY DAY   Phenylephrine HCl (PREPARATION H) 0.25 % SUPP Place rectally daily as needed.   RESTASIS 0.05 % ophthalmic emulsion 1 drop 2 (two) times daily.   rosuvastatin (CRESTOR) 20 MG tablet Take 1 tablet (20 mg total) by mouth daily.   Travoprost, BAK Free, (TRAVATAN) 0.004 % SOLN ophthalmic solution 1 drop at bedtime.    diclofenac Sodium (VOLTAREN) 1 % GEL    dicyclomine (BENTYL) 20 MG tablet TAKE 1 TABLET (20 MG TOTAL) BY  MOUTH 4 (FOUR) TIMES DAILY - BEFORE MEALS AND AT BEDTIME.   hydrocortisone (ANUSOL-HC) 25 MG suppository USE ONE SUPPOSITORY TWICE A DAY FOR 10 DAYS THEN AS NEEDED (Patient not taking: Reported on 08/25/2022)   hyoscyamine (LEVBID) 0.375 MG 12 hr tablet Take 1 tablet (0.375 mg total) by mouth 2 (two) times daily. (Patient not taking: Reported on 08/25/2022)   ipratropium (ATROVENT) 0.03 % nasal spray  (Patient not taking: Reported on 08/25/2022)   No facility-administered encounter medications on file as of 08/25/2022.    Allergies (verified) Influenza vaccines, Shrimp [shellfish allergy], Other, Celecoxib,  Codeine, Compazine [prochlorperazine], Gabapentin, Iodine, Lisinopril, Peanut-containing drug products, Prednisone, Prochlorperazine edisylate, Robinul [glycopyrrolate], Shrimp extract allergy skin test, Vioxx [rofecoxib], Aspirin, Eggs or egg-derived products, Epinephrine, Erythromycin, Sulfa antibiotics, and Tetracycline   History: Past Medical History:  Diagnosis Date   Allergic rhinitis    Allergy    Arthritis    Asthma    Blood transfusion without reported diagnosis    1966   Cancer ([place])    at age 40.  abdominal   Cancer of kidney ([place])    at age 42.  Right kidney.   Chronic headache    Edema    GERD (gastroesophageal reflux disease)    Glaucoma    Hiatal hernia    Hypertension    IBS (irritable bowel syndrome)    Internal hemorrhoid    Lumbago    Lump or mass in breast    Lung cancer ([facility])    at age 38  Right lung.   Other abnormal blood chemistry    Other and unspecified hyperlipidemia    Other convulsions    hx seizures at age 3   Peptic stricture of esophagus    Reflux esophagitis    Tubular adenoma of colon 2015   Unspecified menopausal and postmenopausal disorder    Unspecified vitamin D deficiency    Past Surgical History:  Procedure Laterality Date   ABDOMINAL SURGERY  at age 40    BREAST SURGERY  1970s and 34s   Bilateral cysts removed   KIDNEY SURGERY  at age 84   Right   LUNG SURGERY  at age 6   Right   NASAL SINUS SURGERY  1980s   TONSILLECTOMY  1970   TOTAL ABDOMINAL HYSTERECTOMY  2000s   Family History  Problem Relation Age of Onset   Allergies Sister        x3   Asthma Sister    Bone cancer Father    Lung cancer Father    Stomach cancer Father    Esophageal cancer Father    Breast cancer Mother    Prostate cancer Brother    Pancreatic cancer Brother        mets from prostate   Colon cancer Brother    Hypertension Brother    Prostate cancer Brother    Bone cancer Brother    Diabetes Brother    Heart disease Sister     Diabetes Sister    Hypertension Sister    Breast cancer Sister        mastectomy   Stroke Sister    Rectal cancer Neg Hx    Liver cancer Neg Hx    Social History   Socioeconomic History   Marital status: Married    Spouse name: Not on file   Number of children: 0   Years of education: Not on file   Highest education level: Not on file  Occupational History  Occupation: Investment banker, corporate (preschool)   Tobacco Use   Smoking status: Never   Smokeless tobacco: Never  Vaping Use   Vaping Use: Never used  Substance and Sexual Activity   Alcohol use: No   Drug use: No   Sexual activity: Not on file  Other Topics Concern   Not on file  Social History Narrative   ** Merged History Encounter **       Pt has 18 siblings. No caffeine drinks  Right handed One story home   Social Determinants of Health   Financial Resource Strain: Low Risk  (08/25/2022)   Overall Financial Resource Strain (CARDIA)    Difficulty of Paying Living Expenses: Not hard at all  Food Insecurity: No Food Insecurity (08/25/2022)   Hunger Vital Sign    Worried About Running Out of Food in the Last Year: Never true    Ran Out of Food in the Last Year: Never true  Transportation Needs: No Transportation Needs (08/25/2022)   PRAPARE - Hydrologist (Medical): No    Lack of Transportation (Non-Medical): No  Physical Activity: Insufficiently Active (08/25/2022)   Exercise Vital Sign    Days of Exercise per Week: 3 days    Minutes of Exercise per Session: 30 min  Stress: No Stress Concern Present (08/25/2022)   [place]    Feeling of Stress : Not at all  Social Connections: Not on file    Tobacco Counseling Counseling given: Not Answered   Clinical Intake:  Pre-visit preparation completed: Yes  Pain : 0-10 Pain Score: 8  Pain Type: Chronic pain Pain Location:  (rectum) Pain Descriptors / Indicators:  Sharp Pain Onset: More than a month ago Pain Frequency: Constant     Nutritional Status: BMI > 30  Obese Nutritional Risks: Nausea/ vomitting/ diarrhea (nausea with the hemorrhoids as well as constant diarrhea) Diabetes: Yes  How often do you need to have someone help you when you read instructions, pamphlets, or other written materials from your doctor or pharmacy?: 1 - Never What is the last grade level you completed in school?: college  Diabetic? Yes Nutrition Risk Assessment:  Has the patient had any N/V/D within the last 2 months?  Yes  Does the patient have any non-healing wounds?  No  Has the patient had any unintentional weight loss or weight gain?  No   Diabetes:  Is the patient diabetic?  Yes  If diabetic, was a CBG obtained today?  No  Did the patient bring in their glucometer from home?  No  How often do you monitor your CBG's? daily.   Financial Strains and Diabetes Management:  Are you having any financial strains with the device, your supplies or your medication? No .  Does the patient want to be seen by Chronic Care Management for management of their diabetes?  No  Would the patient like to be referred to a Nutritionist or for Diabetic Management?  No   Diabetic Exams:  Diabetic Eye Exam: Overdue for diabetic eye exam. Pt has been advised about the importance in completing this exam. Patient advised to call and schedule an eye exam. Diabetic Foot Exam: Completed 06/10/2022   Interpreter Needed?: No  Information entered by :: NAllen LPN   Activities of Daily Living    08/25/2022    9:39 AM  In your present state of health, do you have any difficulty performing the following activities:  Hearing? 1  Comment slight  Vision? 1  Comment has a floater, slight cataract  Difficulty concentrating or making decisions? 0  Walking or climbing stairs? 0  Dressing or bathing? 0  Doing errands, shopping? 0  Preparing Food and eating ? N  Using the Toilet? N   In the past six months, have you accidently leaked urine? N  Do you have problems with loss of bowel control? Y  Managing your Medications? N  Managing your Finances? N  Housekeeping or managing your Housekeeping? N    Patient Care Team: Nche, Charlene Brooke, NP as PCP - General (Internal Medicine) Pieter Partridge, DO as Consulting Physician (Neurology) Monna Fam, MD as Consulting Physician (Ophthalmology) [facility], [facility]  Indicate any recent Medical Services you may have received from other than [facility] providers in the past year (date may be approximate).     Assessment:   This is a routine wellness examination for Mary Collier.  Hearing/Vision screen Vision Screening - Comments:: Regular eye exams, [facility]  Dietary issues and exercise activities discussed: Current Exercise Habits: Home exercise routine, Type of exercise: calisthenics;strength training/weights;stretching, Time (Minutes): 30, Frequency (Times/Week): 3, Weekly Exercise (Minutes/Week): 90   Goals Addressed             This Visit's Progress    Patient Stated       08/25/2022, wants to lose weight       Depression Screen    08/25/2022    9:39 AM 06/11/2022    9:59 AM 05/23/2021    1:34 PM 03/21/2020   10:28 AM 02/14/2020   11:30 AM 12/07/2018    2:16 PM 06/15/2018    2:44 PM  PHQ 2/9 Scores  PHQ - 2 Score 0 0 0 0 0 0 0    Fall Risk    08/25/2022    9:39 AM 02/18/2022    9:14 AM 01/18/2021    8:28 AM 08/06/2020   10:26 AM 03/21/2020   10:28 AM  Fall Risk   Falls in the past year? 0 0 0 0 0  Number falls in past yr: 0 0 0 0 0  Injury with Fall? 0 0 0 0 0  Risk for fall due to : Medication side effect      Follow up Falls prevention discussed;Education provided;Falls evaluation completed        FALL RISK PREVENTION PERTAINING TO THE HOME:  Any stairs in or around the home? Yes  If so, are there any without handrails? No  Home free of loose throw rugs in walkways, pet beds,  electrical cords, etc? Yes  Adequate lighting in your home to reduce risk of falls? Yes   ASSISTIVE DEVICES UTILIZED TO PREVENT FALLS:  Life alert? No  Use of a cane, walker or w/c? No  Grab bars in the bathroom? No  Shower chair or bench in shower? Yes  Elevated toilet seat or a handicapped toilet? Yes   TIMED UP AND GO:  Was the test performed? No .      Cognitive Function:    12/10/2021   11:50 AM 06/15/2018    2:57 PM 04/09/2016    4:26 PM  MMSE - Mini Mental State Exam  Orientation to time 5 5 5   Orientation to Place 5 5 5   Registration 3 3 3   Attention/ Calculation 3  5  Recall 2 3 3   Language- name 2 objects 2 2 2   Language- repeat 1 1 1   Language- follow 3 step  command 3 3 3   Language- read & follow direction 1 1 1   Write a sentence 1 1 1   Copy design 1 1 1   Total score 27  30        08/25/2022    9:41 AM  6CIT Screen  What Year? 0 points  What month? 0 points  What time? 0 points  Count back from 20 0 points  Months in reverse 4 points  Repeat phrase 0 points  Total Score 4 points    Immunizations Immunization History  Administered Date(s) Administered   PFIZER(Purple Top)SARS-COV-2 Vaccination 12/16/2019, 01/07/2020, 08/08/2020   Pfizer Covid-19 Vaccine Bivalent Booster 10yrs & up 08/12/2021   Pneumococcal Conjugate-13 04/11/2014   Pneumococcal Polysaccharide-23 04/08/2017   Tdap 02/14/2020    TDAP status: Up to date  Flu Vaccine status: Declined, Education has been provided regarding the importance of this vaccine but patient still declined. Advised may receive this vaccine at local pharmacy or Health Dept. Aware to provide a copy of the vaccination record if obtained from local pharmacy or Health Dept. Verbalized acceptance and understanding.  Pneumococcal vaccine status: Up to date  Covid-19 vaccine status: Completed vaccines  Qualifies for Shingles Vaccine? Yes   Zostavax completed No   Shingrix Completed?: No.    Education has been  provided regarding the importance of this vaccine. Patient has been advised to call insurance company to determine out of pocket expense if they have not yet received this vaccine. Advised may also receive vaccine at local pharmacy or Health Dept. Verbalized acceptance and understanding.  Screening Tests Health Maintenance  Topic Date Due   Medicare Annual Wellness (AWV)  Never done   OPHTHALMOLOGY EXAM  Never done   Zoster Vaccines- Shingrix (1 of 2) Never done   COVID-19 Vaccine (5 - Pfizer series) 12/13/2021   INFLUENZA VACCINE  05/20/2022   HEMOGLOBIN A1C  12/12/2022   Diabetic kidney evaluation - Urine ACR  03/12/2023   FOOT EXAM  06/11/2023   Diabetic kidney evaluation - GFR measurement  07/23/2023   MAMMOGRAM  10/29/2023   COLONOSCOPY (Pts 45-30yrs Insurance coverage will need to be confirmed)  09/05/2026   TETANUS/TDAP  02/13/2030   Pneumonia Vaccine 60+ Years old  Completed   DEXA SCAN  Completed   Hepatitis C Screening  Completed   HPV VACCINES  Aged Out    Health Maintenance  Health Maintenance Due  Topic Date Due   Medicare Annual Wellness (AWV)  Never done   OPHTHALMOLOGY EXAM  Never done   Zoster Vaccines- Shingrix (1 of 2) Never done   COVID-19 Vaccine (5 - Pfizer series) 12/13/2021   INFLUENZA VACCINE  05/20/2022    Colorectal cancer screening: Type of screening: Colonoscopy. Completed 09/06/2019. Repeat every 5 years  Mammogram status: Completed 10/28/2021. Repeat every year  Bone Density status: Completed 07/14/2019.   Lung Cancer Screening: (Low Dose CT Chest recommended if Age 27-80 years, 30 pack-year currently smoking OR have quit w/in 15years.) does not qualify.   Lung Cancer Screening Referral: no  Additional Screening:  Hepatitis C Screening: does qualify; Completed 02/14/2020  Vision Screening: Recommended annual ophthalmology exams for early detection of glaucoma and other disorders of the eye. Is the patient up to date with their annual eye  exam?  No  Who is the provider or what is the name of the office in which the patient attends annual eye exams? [facility] If pt is not established with a provider, would they like to be  referred to a provider to establish care? No .   Dental Screening: Recommended annual dental exams for proper oral hygiene  Community Resource Referral / Chronic Care Management: CRR required this visit?  No   CCM required this visit?  No      Plan:     I have personally reviewed and noted the following in the patient's chart:   Medical and social history Use of alcohol, tobacco or illicit drugs  Current medications and supplements including opioid prescriptions. Patient is not currently taking opioid prescriptions. Functional ability and status Nutritional status Physical activity Advanced directives List of other physicians Hospitalizations, surgeries, and ER visits in previous 12 months Vitals Screenings to include cognitive, depression, and falls Referrals and appointments  In addition, I have reviewed and discussed with patient certain preventive protocols, quality metrics, and best practice recommendations. A written personalized care plan for preventive services as well as general preventive health recommendations were provided to patient.     Kellie Simmering, LPN   [id number]   Nurse Notes: none  Due to this being a virtual visit, the after visit summary with patients personalized plan was offered to patient via mail or my-chart.  Patient would like to access on my-chart

## 2022-08-27 ENCOUNTER — Emergency Department (HOSPITAL_BASED_OUTPATIENT_CLINIC_OR_DEPARTMENT_OTHER): Payer: PPO | Admitting: Radiology

## 2022-08-27 ENCOUNTER — Encounter (HOSPITAL_BASED_OUTPATIENT_CLINIC_OR_DEPARTMENT_OTHER): Payer: Self-pay | Admitting: Emergency Medicine

## 2022-08-27 ENCOUNTER — Emergency Department (HOSPITAL_BASED_OUTPATIENT_CLINIC_OR_DEPARTMENT_OTHER)
Admission: EM | Admit: 2022-08-27 | Discharge: 2022-08-27 | Disposition: A | Discharge status: Home / Self Care | Payer: PPO | Attending: Emergency Medicine | Admitting: Emergency Medicine

## 2022-08-27 ENCOUNTER — Other Ambulatory Visit: Payer: Self-pay

## 2022-08-27 DIAGNOSIS — M533 Sacrococcygeal disorders, not elsewhere classified: Secondary | ICD-10-CM | POA: Insufficient documentation

## 2022-08-27 DIAGNOSIS — Z79899 Other long term (current) drug therapy: Secondary | ICD-10-CM | POA: Insufficient documentation

## 2022-08-27 DIAGNOSIS — Z91010 Allergy to peanuts: Secondary | ICD-10-CM | POA: Diagnosis not present

## 2022-08-27 DIAGNOSIS — M545 Low back pain, unspecified: Secondary | ICD-10-CM | POA: Insufficient documentation

## 2022-08-27 MED ORDER — HYDROCODONE-ACETAMINOPHEN 5-325 MG PO TABS: 1.0000 | ORAL_TABLET | ORAL | 0 refills | Status: DC | PRN

## 2022-08-27 MED ORDER — IBUPROFEN 800 MG PO TABS
800.0000 mg | ORAL_TABLET | Freq: Once | ORAL | Status: AC
  Administered 2022-08-27: 800 mg via ORAL
  Filled 2022-08-27: qty 1

## 2022-08-27 MED ORDER — IBUPROFEN 800 MG PO TABS: 800.0000 mg | ORAL_TABLET | Freq: Three times a day (TID) | ORAL | 0 refills | Status: DC | PRN

## 2022-08-27 MED ORDER — HYDROCODONE-ACETAMINOPHEN 5-325 MG PO TABS
1.0000 | ORAL_TABLET | Freq: Once | ORAL | Status: DC
  Filled 2022-08-27: qty 1

## 2022-08-27 NOTE — ED Notes (Signed)
Discharge paperwork given and verbally understood. 

## 2022-08-27 NOTE — Discharge Instructions (Addendum)
Return if any problems.  Recheck at Urgent care in 2 days.  Watch carefully for a rash.  Follow up with your MD next week

## 2022-08-27 NOTE — ED Triage Notes (Signed)
Pt arrives pov, to triage in wheelchair, c/o coccyx pain that started 3 hrs pta. Denies injury. Took 100 mg ibuprofen

## 2022-08-28 NOTE — ED Provider Notes (Signed)
[place] EMERGENCY DEPT Provider Note   CSN: [id number] Arrival date & time: 08/27/22  4174     History  Chief Complaint  Patient presents with   Tailbone Pain    Mary Collier is a 71 y.o. female.  Patient complains of pain in her coccyx area.  Patient reports she has not had any falls she has not had any injury.  Patient denies any fever or chills she has not experienced any swelling.  Patient reports she did have a small area of rash in this area which has now dried up.  Patient has not been around anyone who has been sick  The history is provided by the patient. No language interpreter was used.       Home Medications Prior to Admission medications   Medication Sig Start Date End Date Taking? Authorizing Provider  ibuprofen (ADVIL) 800 MG tablet Take 1 tablet (800 mg total) by mouth every 8 (eight) hours as needed. 08/27/22  Yes Fransico Meadow, PA-C  acetaminophen (TYLENOL) 325 MG tablet Take 325 mg by mouth as needed.    [provider]  calcium-vitamin D (OSCAL 500/200 D-3) 500-200 MG-UNIT per tablet Take 1 tablet by mouth 2 (two) times daily. 07/12/14   Blanchie Serve, MD  [facility]S SENNA PLUS 8.6-50 MG tablet TAKE 1 TABLET BY MOUTH DAILY. 01/04/16   Lauree Chandler, NP  diclofenac sodium (VOLTAREN) 1 % GEL APPLY 2 G TOPICALLY 3 (THREE) TIMES DAILY AS NEEDED. 06/04/19   Nche, Charlene Brooke, NP  diclofenac Sodium (VOLTAREN) 1 % GEL     [provider]  dicyclomine (BENTYL) 20 MG tablet TAKE 1 TABLET (20 MG TOTAL) BY MOUTH 4 (FOUR) TIMES DAILY - BEFORE MEALS AND AT BEDTIME. 04/07/22 05/07/22  Ladene Artist, MD  dimenhyDRINATE (DRAMAMINE) 50 MG tablet Take 50 mg by mouth every 8 (eight) hours as needed for dizziness.    Emergency, Nurse, RN  fluticasone (FLONASE) 50 MCG/ACT nasal spray SPRAY 2 SPRAYS INTO EACH NOSTRIL EVERY DAY 06/20/19   Nche, Charlene Brooke, NP  folic acid (FOLVITE) 1 MG tablet Take 1 mg by mouth daily.    [provider]   hydrocortisone (ANUSOL-HC) 25 MG suppository USE ONE SUPPOSITORY TWICE A DAY FOR 10 DAYS THEN AS NEEDED Patient not taking: Reported on 08/25/2022 07/22/22   Ladene Artist, MD  hyoscyamine (LEVBID) 0.375 MG 12 hr tablet Take 1 tablet (0.375 mg total) by mouth 2 (two) times daily. Patient not taking: Reported on 08/25/2022 07/22/22   Ladene Artist, MD  ipratropium (ATROVENT) 0.03 % nasal spray     [provider]  losartan (COZAAR) 100 MG tablet Take 1 tablet (100 mg total) by mouth daily. 06/11/22   Nche, Charlene Brooke, NP  LUMIGAN 0.01 % SOLN 1 drop at bedtime. 10/31/19   [provider]  metFORMIN (GLUCOPHAGE) 500 MG tablet Take 0.5 tablets (250 mg total) by mouth daily with breakfast. 06/11/22   Nche, Charlene Brooke, NP  metoprolol succinate (TOPROL-XL) 25 MG 24 hr tablet Take 1 tablet (25 mg total) by mouth daily. 06/11/22   Nche, Charlene Brooke, NP  nortriptyline (PAMELOR) 25 MG capsule Take 1 capsule (25 mg total) by mouth at bedtime. 02/18/22   Tomi Likens, Adam R, DO  omeprazole (PRILOSEC) 40 MG capsule TAKE 1 CAPSULE BY MOUTH EVERY DAY 01/06/22   Nche, Charlene Brooke, NP  Phenylephrine HCl (PREPARATION H) 0.25 % SUPP Place rectally daily as needed.    [provider]  RESTASIS 0.05 % ophthalmic emulsion 1 drop 2 (two) times daily. 01/16/20   [provider]  rosuvastatin (CRESTOR) 20 MG tablet Take 1 tablet (20 mg total) by mouth daily. 06/11/22   Nche, Charlene Brooke, NP  Travoprost, BAK Free, (TRAVATAN) 0.004 % SOLN ophthalmic solution 1 drop at bedtime.  12/27/19   [provider]      Allergies    Influenza vaccines, Shrimp [shellfish allergy], Other, Celecoxib, Codeine, Compazine [prochlorperazine], Gabapentin, Iodine, Lisinopril, Peanut-containing drug products, Prednisone, Prochlorperazine edisylate, Robinul [glycopyrrolate], Shrimp extract allergy skin test, Vioxx [rofecoxib], Aspirin, Eggs or egg-derived products, Epinephrine, Erythromycin, Sulfa  antibiotics, and Tetracycline    Review of Systems   Review of Systems  All other systems reviewed and are negative.   Physical Exam Updated Vital Signs BP (!) 188/89 (BP Location: Right Arm)   Pulse 64   Temp 98.3 F (36.8 C)   Resp 14   SpO2 98%  Physical Exam Vitals and nursing note reviewed.  Constitutional:      Appearance: She is well-developed.  HENT:     Head: Normocephalic.  Eyes:     Pupils: Pupils are equal, round, and reactive to light.  Pulmonary:     Effort: Pulmonary effort is normal.  Abdominal:     General: There is no distension.  Musculoskeletal:        General: Normal range of motion.     Cervical back: Normal range of motion.     Comments: Low back no evidence of rash no sign of shingles tender distal coccyx to palpation.  Patient has full range of motion of lower extremities  Skin:    General: Skin is warm.  Neurological:     Mental Status: She is alert and oriented to person, place, and time.  Psychiatric:        Mood and Affect: Mood normal.     ED Results / Procedures / Treatments   Labs (all labs ordered are listed, but only abnormal results are displayed) Labs Reviewed - No data to display  EKG None  Radiology DG Sacrum/Coccyx  Result Date: 08/27/2022 CLINICAL DATA:  Coccygeal pain for 3 hours, denies injury EXAM: SACRUM AND COCCYX - 2+ VIEW COMPARISON:  None Available. FINDINGS: Hip and SI joint spaces preserved. Sacral foramina symmetric. No sacrococcygeal fracture or bone destruction identified. Mild degenerative disc disease changes L3-L4. IMPRESSION: No sacrococcygeal abnormalities. Electronically Signed   By: Lavonia Dana M.D.   On: 08/27/2022 10:52    Procedures Procedures    Medications Ordered in ED Medications  ibuprofen (ADVIL) tablet 800 mg (800 mg Oral Given 08/27/22 1213)    ED Course/ Medical Decision Making/ A&P                           Medical Decision Making Patient complains of pain in the tailbone  area  Amount and/or Complexity of Data Reviewed Independent Historian: spouse    Details: Patient is here with spouse who is supportive Radiology: ordered and independent interpretation performed. Decision-making details documented in ED Course.    Details: 3 of sacral and coccyx area shows no evidence of fracture  Risk Prescription drug management. Risk Details: Patient is given ibuprofen 800 mg I offered her hydrocodone however patient does not like to take pain medicine patient is given a prescription for ibuprofen she is advised to recheck in 2 days with urgent care or with her primary care physician.  Vies patient to watch  carefully for any signs of rash           Final Clinical Impression(s) / ED Diagnoses Final diagnoses:  Coccyodynia  Acute low back pain without sciatica, unspecified back pain laterality    Rx / DC Orders ED Discharge Orders          Ordered    HYDROcodone-acetaminophen (NORCO/VICODIN) 5-325 MG tablet  Every 4 hours PRN,   Status:  Discontinued        08/27/22 1134    ibuprofen (ADVIL) 800 MG tablet  Every 8 hours PRN        08/27/22 1209           An After Visit Summary was printed and given to the patient.    Fransico Meadow, PA-C 08/28/22 0756    Cristie Hem, MD 08/28/22 [phone]

## 2022-08-29 ENCOUNTER — Ambulatory Visit
Admission: EM | Admit: 2022-08-29 | Discharge: 2022-08-29 | Disposition: A | Discharge status: Home / Self Care | Payer: PPO | Attending: Physician Assistant | Admitting: Physician Assistant

## 2022-08-29 DIAGNOSIS — B029 Zoster without complications: Secondary | ICD-10-CM | POA: Diagnosis not present

## 2022-08-29 MED ORDER — VALACYCLOVIR HCL 1 G PO TABS: 1000.0000 mg | ORAL_TABLET | Freq: Three times a day (TID) | ORAL | 0 refills | Status: DC

## 2022-08-29 MED ORDER — KETOROLAC TROMETHAMINE 30 MG/ML IJ SOLN
15.0000 mg | Freq: Once | INTRAMUSCULAR | Status: AC
  Administered 2022-08-29: 15 mg via INTRAMUSCULAR

## 2022-08-29 NOTE — ED Triage Notes (Addendum)
Pt presents to [facility] with co of low back pain/ coccyx pain for 3 days. Pt denies any injury. Pt denies dysuria. Pt reports sharp pain shooting up her vagina. Pt reports they gave her 800 mg motrin but she cannot take with her other medications and told her come in if it doesn't get better,. Pt reports rash developed over area with pain and has not had a shingles shot. No visible rash but pt reports painful and itchy

## 2022-08-29 NOTE — ED Provider Notes (Signed)
[facility]    CSN: [id number] Arrival date & time: 08/29/22  1011      History   Chief Complaint Chief Complaint  Patient presents with   Tailbone Pain    HPI Mary Collier is a 71 y.o. female.   Patient here today for evaluation of low back pain/ coccyx pain that started 3 days ago. She reports she has developed a small rash to the area. The rash is itchy. She notes that pain is intermittent and has a shooting quality. She has not been vaccinated against shingles. She requests shot for pain if possible.   The history is provided by the patient.    Past Medical History:  Diagnosis Date   Allergic rhinitis    Allergy    Arthritis    Asthma    Blood transfusion without reported diagnosis    1966   Cancer ([place])    at age 60.  abdominal   Cancer of kidney ([place])    at age 69.  Right kidney.   Chronic headache    Edema    GERD (gastroesophageal reflux disease)    Glaucoma    Hiatal hernia    Hypertension    IBS (irritable bowel syndrome)    Internal hemorrhoid    Lumbago    Lump or mass in breast    Lung cancer (H[facility])    at age 32  Right lung.   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   Postmenopausal estrogen deficiency 06/15/2019   GERD (gastroesophageal reflux disease) 07/12/2014   Fibromyalgia 04/27/2013   Special screening for malignant neoplasms, colon 03/30/2013   Vitamin D  deficiency 03/30/2013   HTN (hypertension) 03/30/2013   Hyperlipidemia associated with type 2 diabetes mellitus ([place]) 03/30/2013   Constipation 03/30/2013   Routine general medical examination at a health care facility 03/30/2013   DM (diabetes mellitus) ([place]) 03/30/2013   Pulmonary nodule 04/16/2011    Past Surgical History:  Procedure Laterality Date   ABDOMINAL SURGERY  at age 35    BREAST SURGERY  1970s and 1980s   Bilateral cysts removed   KIDNEY SURGERY  at age 80   Right   LUNG SURGERY  at age 42   Right   [place]  2000s    OB History   No obstetric history on file.      Home Medications    Prior to Admission medications   Medication Sig Start Date End Date Taking? Authorizing Provider  valACYclovir (VALTREX) 1000 MG tablet Take 1 tablet (1,000 mg total) by mouth 3 (three) times daily. 08/29/22  Yes Francene Finders, PA-C  acetaminophen (TYLENOL) 325 MG tablet Take 325 mg by mouth as needed.    [provider]  calcium-vitamin D (OSCAL 500/200 D-3) 500-200 MG-UNIT per  tablet Take 1 tablet by mouth 2 (two) times daily. 07/12/14   Blanchie Serve, MD  [facility] SENNA PLUS 8.6-50 MG tablet TAKE 1 TABLET BY MOUTH DAILY. 01/04/16   Lauree Chandler, NP  diclofenac sodium (VOLTAREN) 1 % GEL APPLY 2 G TOPICALLY 3 (THREE) TIMES DAILY AS NEEDED. 06/04/19   Nche, Charlene Brooke, NP  diclofenac Sodium (VOLTAREN) 1 % GEL     [provider]  dicyclomine (BENTYL) 20 MG tablet TAKE 1 TABLET (20 MG TOTAL) BY MOUTH 4 (FOUR) TIMES DAILY - BEFORE MEALS AND AT BEDTIME. 04/07/22 05/07/22  Ladene Artist, MD  dimenhyDRINATE (DRAMAMINE) 50 MG tablet Take 50 mg by mouth every 8 (eight) hours as needed for dizziness.    Emergency, Nurse, RN  fluticasone (FLONASE) 50 MCG/ACT nasal spray SPRAY 2 SPRAYS INTO EACH NOSTRIL EVERY DAY 06/20/19   Nche, Charlene Brooke, NP  folic acid (FOLVITE) 1 MG tablet Take 1 mg by  mouth daily.    [provider]  hydrocortisone (ANUSOL-HC) 25 MG suppository USE ONE SUPPOSITORY TWICE A DAY FOR 10 DAYS THEN AS NEEDED Patient not taking: Reported on 08/25/2022 07/22/22   Ladene Artist, MD  hyoscyamine (LEVBID) 0.375 MG 12 hr tablet Take 1 tablet (0.375 mg total) by mouth 2 (two) times daily. Patient not taking: Reported on 08/25/2022 07/22/22   Ladene Artist, MD  ibuprofen (ADVIL) 800 MG tablet Take 1 tablet (800 mg total) by mouth every 8 (eight) hours as needed. 08/27/22   Fransico Meadow, PA-C  ipratropium (ATROVENT) 0.03 % nasal spray     [provider]  losartan (COZAAR) 100 MG tablet Take 1 tablet (100 mg total) by mouth daily. 06/11/22   Nche, Charlene Brooke, NP  LUMIGAN 0.01 % SOLN 1 drop at bedtime. 10/31/19   [provider]  metFORMIN (GLUCOPHAGE) 500 MG tablet Take 0.5 tablets (250 mg total) by mouth daily with breakfast. 06/11/22   Nche, Charlene Brooke, NP  metoprolol succinate (TOPROL-XL) 25 MG 24 hr tablet Take 1 tablet (25 mg total) by mouth daily. 06/11/22   Nche, Charlene Brooke, NP  nortriptyline (PAMELOR) 25 MG capsule Take 1 capsule (25 mg total) by mouth at bedtime. 02/18/22   Tomi Likens, Adam R, DO  omeprazole (PRILOSEC) 40 MG capsule TAKE 1 CAPSULE BY MOUTH EVERY DAY 01/06/22   Nche, Charlene Brooke, NP  Phenylephrine HCl (PREPARATION H) 0.25 % SUPP Place rectally daily as needed.    [provider]  RESTASIS 0.05 % ophthalmic emulsion 1 drop 2 (two) times daily. 01/16/20   [provider]  rosuvastatin (CRESTOR) 20 MG tablet Take 1 tablet (20 mg total) by mouth daily. 06/11/22   Nche, Charlene Brooke, NP  Travoprost, BAK Free, (TRAVATAN) 0.004 % SOLN ophthalmic solution 1 drop at bedtime.  12/27/19   [provider]    Family History Family History  Problem Relation Age of Onset   Allergies Sister        x3   Asthma Sister    Bone cancer Father    Lung cancer Father    Stomach cancer Father    Esophageal cancer  Father    Breast cancer Mother    Prostate cancer Brother    Pancreatic cancer Brother        mets from prostate   Colon cancer Brother    Hypertension Brother    Prostate cancer Brother    Bone cancer Brother    Diabetes Brother    Heart disease  Sister    Diabetes Sister    Hypertension Sister    Breast cancer Sister        mastectomy   Stroke Sister    Rectal cancer Neg Hx    Liver cancer Neg Hx     Social History Social History   Tobacco Use   Smoking status: Never   Smokeless tobacco: Never  Vaping Use   Vaping Use: Never used  Substance Use Topics   Alcohol use: No   Drug use: No     Allergies   Influenza vaccines, Shrimp [shellfish allergy], Other, Celecoxib, Codeine, Compazine [prochlorperazine], Gabapentin, Iodine, Lisinopril, Peanut-containing drug products, Prednisone, Prochlorperazine edisylate, Robinul [glycopyrrolate], Shrimp extract allergy skin test, Vioxx [rofecoxib], Aspirin, Eggs or egg-derived products, Epinephrine, Erythromycin, Sulfa antibiotics, and Tetracycline   Review of Systems Review of Systems  Constitutional:  Negative for chills and fever.  Eyes:  Negative for discharge and redness.  Gastrointestinal:  Negative for nausea and vomiting.  Skin:  Positive for rash.     Physical Exam Triage Vital Signs ED Triage Vitals  Enc Vitals Group     BP 08/29/22 1035 (!) 153/73     Pulse Rate 08/29/22 1035 (!) 53     Resp 08/29/22 1035 19     Temp 08/29/22 1035 (!) 97.3 F (36.3 C)     Temp src --      SpO2 08/29/22 1035 98 %     Weight --      Height --      Head Circumference --      Peak Flow --      Pain Score 08/29/22 1034 8     Pain Loc --      Pain Edu? --      Excl. in [place]? --    No data found.  Updated Vital Signs BP (!) 153/73   Pulse (!) 53   Temp (!) 97.3 F (36.3 C)   Resp 19   SpO2 98%     Physical Exam Vitals and nursing note reviewed.  Constitutional:      General: She is not in acute distress.     Appearance: Normal appearance. She is not ill-appearing.  HENT:     Head: Normocephalic and atraumatic.  Eyes:     Conjunctiva/sclera: Conjunctivae normal.  Cardiovascular:     Rate and Rhythm: Normal rate.  Pulmonary:     Effort: Pulmonary effort is normal. No respiratory distress.  Musculoskeletal:     Comments: Approx 1.5 cm area of hyperpigmented papular cluster to right of midline above gluteal cleft  Neurological:     Mental Status: She is alert.  Psychiatric:        Mood and Affect: Mood normal.        Behavior: Behavior normal.        Thought Content: Thought content normal.      UC Treatments / Results  Labs (all labs ordered are listed, but only abnormal results are displayed) Labs Reviewed - No data to display  EKG   Radiology No results found.  Procedures Procedures (including critical care time)  Medications Ordered in UC Medications  ketorolac (TORADOL) 30 MG/ML injection 15 mg (15 mg Intramuscular Given 08/29/22 1100)    Initial Impression / Assessment and Plan / UC Course  I have reviewed the triage vital signs and the nursing notes.  Pertinent labs & imaging results that were available during my care of the patient were reviewed by me and considered in  my medical decision making (see chart for details).    Will treat to cover possible shingles with valtrex. Low does Toradol injection administered din office for pain. Encouraged follow up if no gradual improvement or with any further concerns.   Final Clinical Impressions(s) / UC Diagnoses   Final diagnoses:  Herpes zoster without complication   Discharge Instructions   None    ED Prescriptions     Medication Sig Dispense Auth. Provider   valACYclovir (VALTREX) 1000 MG tablet Take 1 tablet (1,000 mg total) by mouth 3 (three) times daily. 21 tablet Francene Finders, PA-C      PDMP not reviewed this encounter.   Francene Finders, PA-C 08/29/22 1202

## 2022-09-15 ENCOUNTER — Ambulatory Visit: Payer: PPO | Admitting: Nurse Practitioner

## 2022-09-15 ENCOUNTER — Telehealth: Payer: Self-pay | Admitting: Nurse Practitioner

## 2022-09-15 NOTE — Telephone Encounter (Signed)
Pt sister is in the hospital and she is with her and not able to leave. 1st no show. Fee waived. Pt was advised of no show policy. Letter sent via mychart.

## 2022-09-17 ENCOUNTER — Ambulatory Visit: Payer: PPO | Admitting: Gastroenterology

## 2022-09-17 ENCOUNTER — Encounter: Payer: Self-pay | Admitting: Gastroenterology

## 2022-09-17 VITALS — BP 136/90 | HR 57 | Ht 61.0 in | Wt 174.2 lb

## 2022-09-17 DIAGNOSIS — K582 Mixed irritable bowel syndrome: Secondary | ICD-10-CM | POA: Diagnosis not present

## 2022-09-17 DIAGNOSIS — Z86010 Personal history of colonic polyps: Secondary | ICD-10-CM

## 2022-09-17 NOTE — Progress Notes (Signed)
Assessment     IBS with alternating constipation and diarrhea with occasional incontinence Internal hemorrhoids Personal history of adenomatous colon polyps   Recommendations    Continue Imodium 1-2 p.o. 3 times daily as needed diarrhea Begin IBgard 1-2 p.o. 3 times daily as needed abdominal pain, bloating Kegel exercises 5 times/day long-term REV in 1 year   HPI    This is a 71 year old female returns with ongoing problems with left-sided abdominal pain, bloating, alternating diarrhea and constipation with occasional incontinence.  She has eliminated several foods using the low FODMAP diet.  Imodium has been helpful to control her symptoms.  She was not able to obtain Levbid or Robinul as it was not covered by her prescription plan and was too expensive.  She relates that dicyclomine is effective in controlling her pain and bloating however it causes nausea.  Colon Nov 2020 - Four 6 to 7 mm polyps in the descending colon and in the ascending colon, removed with a cold snare. Resected and retrieved. - Mild diverticulosis in the left colon. - Internal hemorrhoids. - The examination was otherwise normal on direct and retroflexion views.   EGD July 2018 - Normal esophagus. - Gastritis. Biopsied. - Normal duodenal bulb and second portion of the duodenum.   Path: H pylori gastritis   Labs / Imaging       Latest Ref Rng & Units 07/22/2022    9:08 AM 03/11/2022   10:52 AM 12/12/2021    9:12 AM  Hepatic Function  Total Protein 6.0 - 8.3 g/dL 7.8   7.4   Albumin 3.5 - 5.2 g/dL 4.1  4.2  4.1   AST 0 - 37 U/L 14   14   ALT 0 - 35 U/L 12   13   Alk Phosphatase 39 - 117 U/L 67   67   Total Bilirubin 0.2 - 1.2 mg/dL 0.3   0.4   Bilirubin, Direct 0.0 - 0.3 mg/dL   0.1        Latest Ref Rng & Units 07/22/2022    9:08 AM 03/11/2022   10:52 AM 02/05/2021   10:24 AM  CBC  WBC 4.0 - 10.5 K/uL 6.9  7.3  8.5   Hemoglobin 12.0 - 15.0 g/dL 12.7  12.4  13.1   Hematocrit 36.0 - 46.0 %  37.9  37.8  38.4   Platelets 150.0 - 400.0 K/uL 198.0  204.0  222.0      DG Sacrum/Coccyx  Result Date: 08/27/2022 CLINICAL DATA:  Coccygeal pain for 3 hours, denies injury EXAM: SACRUM AND COCCYX - 2+ VIEW COMPARISON:  None Available. FINDINGS: Hip and SI joint spaces preserved. Sacral foramina symmetric. No sacrococcygeal fracture or bone destruction identified. Mild degenerative disc disease changes L3-L4. IMPRESSION: No sacrococcygeal abnormalities. Electronically Signed   By: Lavonia Dana M.D.   On: 08/27/2022 10:52    Current Medications, Allergies, Past Medical History, Past Surgical History, Family History and Social History were reviewed in [employer] record.   Physical Exam: General: Well developed, well nourished, no acute distress Head: Normocephalic and atraumatic Eyes: Sclerae anicteric, EOMI Ears: Normal auditory acuity Mouth: Not examined Lungs: Clear throughout to auscultation Heart: Regular rate and rhythm; no murmurs, rubs or bruits Abdomen: Soft, mild LLQ tenderness and non distended. No masses, hepatosplenomegaly or hernias noted. Normal Bowel sounds Rectal: Not done Musculoskeletal: Symmetrical with no gross deformities  Pulses:  Normal pulses noted Extremities: No clubbing, cyanosis, edema or deformities noted  Neurological: Alert oriented x 4, grossly nonfocal Psychological:  Alert and cooperative. Normal mood and affect   [name] T. Fuller Plan, MD 09/17/2022, 8:23 AM

## 2022-09-17 NOTE — Patient Instructions (Addendum)
You have been given samples of Ibgard to take 1-2 capsules by mouth three times a day. If this helps, it can be purchased over the counter.  Kegel Exercises  Kegel exercises can help strengthen your pelvic floor muscles. The pelvic floor is a group of muscles that support your rectum, small intestine, and bladder. In females, pelvic floor muscles also help support the uterus. These muscles help you control the flow of urine and stool (feces). Kegel exercises are painless and simple. They do not require any equipment. Your provider may suggest Kegel exercises to: Improve bladder and bowel control. Improve sexual response. Improve weak pelvic floor muscles after surgery to remove the uterus (hysterectomy) or after pregnancy, in females. Improve weak pelvic floor muscles after prostate gland removal or surgery, in males. Kegel exercises involve squeezing your pelvic floor muscles. These are the same muscles you squeeze when you try to stop the flow of urine or keep from passing gas. The exercises can be done while sitting, standing, or lying down, but it is best to vary your position. Ask your health care provider which exercises are safe for you. Do exercises exactly as told by your health care provider and adjust them as directed. Do not begin these exercises until told by your health care provider. Exercises How to do Kegel exercises: Squeeze your pelvic floor muscles tight. You should feel a tight lift in your rectal area. If you are a female, you should also feel a tightness in your vaginal area. Keep your stomach, buttocks, and legs relaxed. Hold the muscles tight for up to 10 seconds. Breathe normally. Relax your muscles for up to 10 seconds. Repeat as told by your health care provider. Repeat this exercise daily as told by your health care provider. Continue to do this exercise for at least 4-6 weeks, or for as long as told by your health care provider. You may be referred to a physical  therapist who can help you learn more about how to do Kegel exercises. Depending on your condition, your health care provider may recommend: Varying how long you squeeze your muscles. Doing several sets of exercises every day. Doing exercises for several weeks. Making Kegel exercises a part of your regular exercise routine. This information is not intended to replace advice given to you by your health care provider. Make sure you discuss any questions you have with your health care provider. Document Revised: 02/14/2021 Document Reviewed: 02/14/2021 Elsevier Patient Education  [employer] [facility] providers would like to encourage you to use [facility] to communicate with providers for non-urgent requests or questions.  Due to long hold times on the telephone, sending your provider a message by [facility] may be a faster and more efficient way to get a response.  Please allow 48 business hours for a response.  Please remember that this is for non-urgent requests.  Thank you for choosing me and [facility].  Pricilla Riffle. Dagoberto Ligas., MD., Marval Regal

## 2022-09-22 ENCOUNTER — Ambulatory Visit (INDEPENDENT_AMBULATORY_CARE_PROVIDER_SITE_OTHER): Payer: PPO | Admitting: Nurse Practitioner

## 2022-09-22 ENCOUNTER — Encounter: Payer: Self-pay | Admitting: Nurse Practitioner

## 2022-09-22 VITALS — BP 126/80 | HR 54 | Temp 97.3°F | Ht 61.0 in | Wt 174.6 lb

## 2022-09-22 DIAGNOSIS — I10 Essential (primary) hypertension: Secondary | ICD-10-CM

## 2022-09-22 DIAGNOSIS — Z8619 Personal history of other infectious and parasitic diseases: Secondary | ICD-10-CM | POA: Diagnosis not present

## 2022-09-22 DIAGNOSIS — E1165 Type 2 diabetes mellitus with hyperglycemia: Secondary | ICD-10-CM | POA: Diagnosis not present

## 2022-09-22 LAB — POCT GLYCOSYLATED HEMOGLOBIN (HGB A1C): Hemoglobin A1C: 5.9 % — AB (ref 4.0–5.6)

## 2022-09-22 NOTE — Progress Notes (Signed)
Established Patient Visit  Patient: Mary Collier   DOB: 01/22/1951   71 y.o. Female  MRN: [id number] Visit Date: 09/22/2022  Subjective:    Chief Complaint  Patient presents with   Office Visit    DM/ HTN/ Hyperlipidemia  Checks BP & BS daily Pt fasting C/o big painful bumps on back, comes & goes  Flu & Shingles vaccine given today    HPI DM (diabetes mellitus) ([facility] [place]) hgbA1c at 5.9%: Controlled No neuropathy Up to date with eye exam: report requested Maintain metformin dose F/up in 29months  HTN (hypertension) BP at goal with losartan BP Readings from Last 3 Encounters:  09/22/22 126/80  09/17/22 (!) 136/90  08/29/22 (!) 153/73    Maintain med dose  History of shingles On right upper buttocks.  Rash healed with use of valtrex. Advised to wait 48months prior to getting shingrix vaccine  Reviewed medical, surgical, and social history today  Medications: Outpatient Medications Prior to Visit  Medication Sig   acetaminophen (TYLENOL) 325 MG tablet Take 325 mg by mouth as needed.   calcium-vitamin D (OSCAL 500/200 D-3) 500-200 MG-UNIT per tablet Take 1 tablet by mouth 2 (two) times daily.   [facility] SENNA PLUS 8.6-50 MG tablet TAKE 1 TABLET BY MOUTH DAILY.   diclofenac sodium (VOLTAREN) 1 % GEL APPLY 2 G TOPICALLY 3 (THREE) TIMES DAILY AS NEEDED.   dimenhyDRINATE (DRAMAMINE) 50 MG tablet Take 50 mg by mouth every 8 (eight) hours as needed for dizziness.   fluticasone (FLONASE) 50 MCG/ACT nasal spray SPRAY 2 SPRAYS INTO EACH NOSTRIL EVERY DAY   folic acid (FOLVITE) 1 MG tablet Take 1 mg by mouth daily.   hydrocortisone (ANUSOL-HC) 25 MG suppository USE ONE SUPPOSITORY TWICE A DAY FOR 10 DAYS THEN AS NEEDED   ibuprofen (ADVIL) 800 MG tablet Take 1 tablet (800 mg total) by mouth every 8 (eight) hours as needed.   ipratropium (ATROVENT) 0.03 % nasal spray    losartan (COZAAR) 100 MG tablet Take 1 tablet (100 mg total) by mouth daily.   LUMIGAN 0.01 % SOLN 1 drop at  bedtime.   metFORMIN (GLUCOPHAGE) 500 MG tablet Take 0.5 tablets (250 mg total) by mouth daily with breakfast.   metoprolol succinate (TOPROL-XL) 25 MG 24 hr tablet Take 1 tablet (25 mg total) by mouth daily.   nortriptyline (PAMELOR) 25 MG capsule Take 1 capsule (25 mg total) by mouth at bedtime.   omeprazole (PRILOSEC) 40 MG capsule TAKE 1 CAPSULE BY MOUTH EVERY DAY   Phenylephrine HCl (PREPARATION H) 0.25 % SUPP Place rectally daily as needed.   RESTASIS 0.05 % ophthalmic emulsion 1 drop 2 (two) times daily.   rosuvastatin (CRESTOR) 20 MG tablet Take 1 tablet (20 mg total) by mouth daily.   Travoprost, BAK Free, (TRAVATAN) 0.004 % SOLN ophthalmic solution 1 drop at bedtime.    dicyclomine (BENTYL) 20 MG tablet TAKE 1 TABLET (20 MG TOTAL) BY MOUTH 4 (FOUR) TIMES DAILY - BEFORE MEALS AND AT BEDTIME. (Patient taking differently: Take 20 mg by mouth as needed.)   [DISCONTINUED] diclofenac Sodium (VOLTAREN) 1 % GEL  (Patient not taking: Reported on 09/22/2022)   [DISCONTINUED] hyoscyamine (LEVBID) 0.375 MG 12 hr tablet Take 1 tablet (0.375 mg total) by mouth 2 (two) times daily. (Patient not taking: Reported on 09/22/2022)   [DISCONTINUED] valACYclovir (VALTREX) 1000 MG tablet Take 1 tablet (1,000 mg total) by mouth 3 (three) times  daily. (Patient not taking: Reported on 09/22/2022)   No facility-administered medications prior to visit.   Reviewed past medical and social history.   ROS per HPI above  Last metabolic panel Lab Results  Component Value Date   GLUCOSE 106 (H) 07/22/2022   NA 140 07/22/2022   K 4.2 07/22/2022   CL 105 07/22/2022   CO2 26 07/22/2022   BUN 17 07/22/2022   CREATININE 0.91 07/22/2022   GFRNONAA 63 06/15/2018   CALCIUM 9.4 07/22/2022   PHOS 3.6 03/11/2022   PROT 7.8 07/22/2022   ALBUMIN 4.1 07/22/2022   LABGLOB 3.5 04/09/2016   AGRATIO 1.2 04/09/2016   BILITOT 0.3 07/22/2022   ALKPHOS 67 07/22/2022   AST 14 07/22/2022   ALT 12 07/22/2022         Objective:  BP 126/80 (BP Location: Right Arm, Patient Position: Sitting, Cuff Size: Normal)   Pulse (!) 54   Temp (!) 97.3 F (36.3 C) (Temporal)   Ht 5\' 1"  (1.549 m)   Wt 174 lb 9.6 oz (79.2 kg)   SpO2 99%   BMI 32.99 kg/m      Physical Exam Cardiovascular:     Rate and Rhythm: Normal rate and regular rhythm.     Pulses: Normal pulses.     Heart sounds: Normal heart sounds.  Pulmonary:     Effort: Pulmonary effort is normal.     Breath sounds: Normal breath sounds.  Musculoskeletal:     Left lower leg: No edema.  Neurological:     Mental Status: She is alert and oriented to person, place, and time.    Results for orders placed or performed in visit on 09/22/22  POCT glycosylated hemoglobin (Hb A1C)  Result Value Ref Range   Hemoglobin A1C 5.9 (A) 4.0 - 5.6 %      Assessment & Plan:    Problem List Items Addressed This Visit       Cardiovascular and Mediastinum   HTN (hypertension)    BP at goal with losartan BP Readings from Last 3 Encounters:  09/22/22 126/80  09/17/22 (!) 136/90  08/29/22 (!) 153/73    Maintain med dose        Endocrine   DM (diabetes mellitus) ([place]) - Primary    hgbA1c at 5.9%: Controlled No neuropathy Up to date with eye exam: report requested Maintain metformin dose F/up in 11months      Relevant Orders   POCT glycosylated hemoglobin (Hb A1C) (Completed)     Other   History of shingles    On right upper buttocks.  Rash healed with use of valtrex. Advised to wait 18months prior to getting shingrix vaccine      Return in about 6 months (around 03/24/2023) for DM, HTN, hyperlipidemia (fasting).     Wilfred Lacy, NP

## 2022-09-22 NOTE — Assessment & Plan Note (Addendum)
hgbA1c at 5.9%: Controlled No neuropathy Up to date with eye exam: report requested Maintain metformin dose F/up in 51months

## 2022-09-22 NOTE — Assessment & Plan Note (Signed)
On right upper buttocks.  Rash healed with use of valtrex. Advised to wait 89months prior to getting shingrix vaccine

## 2022-09-22 NOTE — Patient Instructions (Addendum)
Need to get flublock and Shingles vaccine from retail pharmacy. Go to vaccines.gov to find pharmacy with flublock. Will get eye exam report from [facility]. Maintain current medications.

## 2022-09-22 NOTE — Assessment & Plan Note (Signed)
BP at goal with losartan BP Readings from Last 3 Encounters:  09/22/22 126/80  09/17/22 (!) 136/90  08/29/22 (!) 153/73    Maintain med dose

## 2022-09-23 ENCOUNTER — Encounter: Payer: Self-pay | Admitting: Podiatry

## 2022-09-23 ENCOUNTER — Ambulatory Visit (INDEPENDENT_AMBULATORY_CARE_PROVIDER_SITE_OTHER): Payer: PPO | Admitting: Podiatry

## 2022-09-23 ENCOUNTER — Ambulatory Visit: Payer: PPO | Admitting: Podiatry

## 2022-09-23 VITALS — BP 204/91 | HR 57 | Temp 98.3°F

## 2022-09-23 DIAGNOSIS — L84 Corns and callosities: Secondary | ICD-10-CM

## 2022-09-23 DIAGNOSIS — M79675 Pain in left toe(s): Secondary | ICD-10-CM

## 2022-09-23 DIAGNOSIS — M79674 Pain in right toe(s): Secondary | ICD-10-CM

## 2022-09-23 DIAGNOSIS — E1165 Type 2 diabetes mellitus with hyperglycemia: Secondary | ICD-10-CM | POA: Diagnosis not present

## 2022-09-23 DIAGNOSIS — Q828 Other specified congenital malformations of skin: Secondary | ICD-10-CM

## 2022-09-23 DIAGNOSIS — B351 Tinea unguium: Secondary | ICD-10-CM

## 2022-09-23 NOTE — Progress Notes (Signed)
Subjective:  Patient ID: Mary Collier, female    DOB: 04/30/1951,  MRN: [id number]  CRUZITA LIPA presents to clinic today for preventative diabetic foot care and callus(es) left lower extremity, porokeratotic lesion(s) b/l lower extremities, and painful mycotic nails. Painful toenails interfere with ambulation. Aggravating factors include wearing enclosed shoe gear. Pain is relieved with periodic professional debridement. Painful callus(es) and porokeratotic lesion(s) are aggravated when weightbearing with and without shoegear. Pain is relieved with periodic professional debridement.  Chief Complaint  Patient presents with   Diabetic Foot Care    Patient is a diabetic B.S. am: 99 Last A1c: 5.1 PCP/date seen:Dr. Nche/09-22-22   New problem(s): None.   PCP is Nche, Charlene Brooke, NP.  Allergies  Allergen Reactions   Influenza Vaccines     D/t being allergic to eggs   Shrimp [Shellfish Allergy] Swelling    Lip, throat, tongue swelling   Other Itching    Newspaper Dye   Celecoxib     Able to take IBU   Codeine Swelling   Compazine [Prochlorperazine]    Gabapentin Other (See Comments)    Dizziness and somnolence   Iodine     "Has shellfish allergy, would prefer not to take"   Lisinopril Cough    cough   Peanut-Containing Drug Products     Itching   Prednisone     Rapid heart rate   Prochlorperazine Edisylate     paralyzed   Robinul [Glycopyrrolate]     boils   Shrimp Extract Allergy Skin Test     Other reaction(s): Not available   Vioxx [Rofecoxib]     States able to take IBU   Aspirin Rash    States able to take IBU   Eggs Or Egg-Derived Products Rash   Epinephrine Rash   Erythromycin Rash   Sulfa Antibiotics Rash   Tetracycline Rash   Review of Systems: Negative except as noted in the HPI.  Objective: No changes noted in today's physical examination. Vitals:   09/23/22 1609 09/23/22 1614  BP: (!) 210/95 (!) 204/91  Pulse: (!) 59 (!) 57  Temp: 98.3 F (36.8  C)   SpO2: 98% 98%   LEVA BAINE is a pleasant 71 y.o. female in NAD. AAO x 3.  Vascular Examination: Vascular status intact b/l with palpable pedal pulses. Pedal hair sparse b/l. CFT immediate b/l. No edema. No pain with calf compression b/l. Skin temperature gradient WNL b/l. No cyanosis or clubbing noted b/l LE.  Neurological Examination: Sensation grossly intact b/l with 10 gram monofilament. Vibratory sensation intact b/l.   Dermatological Examination: Pedal skin with normal turgor, texture and tone b/l.   Toenails 2-5 b/l thick, discolored, elongated with subungual debris and pain on dorsal palpation. Anonychia noted bilateral great toes. Nailbed(s) epithelialized.    Hyperkeratotic lesion(s) submet head 5 left foot.  No erythema, no edema, no drainage, no fluctuance.   Porokeratotic lesion(s) submet head 2 left foot and submet head 5 right foot. No erythema, no edema, no drainage, no fluctuance.  Musculoskeletal Examination: Muscle strength 5/5 to b/l LE. No pain, crepitus or joint limitation noted with ROM bilateral LE. No gross bony deformities bilaterally. Patient ambulates independent of any assistive aids.  Radiographs: None  Assessment/Plan: 1. Pain due to onychomycosis of toenails of both feet   2. Porokeratosis   3. Callus   4. Type 2 diabetes mellitus with hyperglycemia, without long-term current use of insulin (HCC)     No orders of the defined types  were placed in this encounter.   -Continue foot and shoe inspections daily. Monitor blood glucose per PCP/Endocrinologist's recommendations. -Patient to continue soft, supportive shoe gear daily. -Toenails 2-5 bilaterally debrided in length and girth without iatrogenic bleeding with sterile nail nipper and dremel.  -Callus(es) submet head 5 left foot pared utilizing sterile scalpel blade without complication or incident. Total number debrided =1. -Porokeratotic lesion(s) submet head 2 left foot and submet head 5  right foot pared and enucleated with sterile currette without incident. Total number of lesions debrided=2. -Patient/POA to call should there be question/concern in the interim.   Return in about 3 months (around 12/23/2022).  Marzetta Board, DPM

## 2022-10-31 ENCOUNTER — Other Ambulatory Visit: Payer: Self-pay | Admitting: Obstetrics and Gynecology

## 2022-10-31 DIAGNOSIS — N6452 Nipple discharge: Secondary | ICD-10-CM

## 2022-11-07 ENCOUNTER — Other Ambulatory Visit: Payer: Self-pay | Admitting: Obstetrics and Gynecology

## 2022-11-07 ENCOUNTER — Ambulatory Visit
Admission: RE | Admit: 2022-11-07 | Discharge: 2022-11-07 | Disposition: A | Discharge status: Home / Self Care | Payer: PPO | Source: Ambulatory Visit | Attending: Obstetrics and Gynecology | Admitting: Obstetrics and Gynecology

## 2022-11-07 DIAGNOSIS — N6452 Nipple discharge: Secondary | ICD-10-CM

## 2022-12-11 DIAGNOSIS — H9313 Tinnitus, bilateral: Secondary | ICD-10-CM | POA: Insufficient documentation

## 2023-01-02 ENCOUNTER — Encounter: Payer: Self-pay | Admitting: Podiatry

## 2023-01-09 ENCOUNTER — Ambulatory Visit: Payer: PPO | Admitting: Podiatry

## 2023-01-26 ENCOUNTER — Other Ambulatory Visit: Payer: Self-pay | Admitting: Nurse Practitioner

## 2023-01-26 DIAGNOSIS — I10 Essential (primary) hypertension: Secondary | ICD-10-CM

## 2023-02-17 NOTE — Progress Notes (Unsigned)
NEUROLOGY FOLLOW UP OFFICE NOTE  Mary Collier [id number]  Assessment/Plan:   Paroxysmal hemicrania vs primary stabbing headache vs migraine - increased and now also involving the left side - improved Right-sided occipital neuralgia - stable Intermittent vertigo  Hypertension   1  Nortriptyline 25mg  at bedtime  2  Requested patient to have report of vestibular testing sent to me. 3  Follow up with PCP regarding blood pressure 4   Otherwise, follow up 1 year or as needed.   Subjective:  Mary Collier is a 72 year old female hypertension and GERD who follows up for chronic paroxysmal hemicrania and right sided occipital neuralgia.   UPDATE: Current preventative medications:  Nortriptyline 25mg  at bedtime  Headaches occur once or twice a week but only last 2-3 minutes.   She has episodic vertigo since she was a teenager.  Occurs intermittently and then resolved for a while.  Lasts 30 minutes.  Occurs spontaneously but also it also positional.  No headache, visual disturbance, ear pain.  Laying down in dark helps.  She has history of ringing in her right ear but now notes a stomping sound.  She saw ENT.  Audiogram was unremarkable.  Dix-Hallpike was negative, although it did reproduce subjective symptoms.  She has been referred to [facility] for full vestibular evaluation.       HISTORY:  She has remote history of headaches and generalized tonic clonic seizures as a teenager, which subsequently resolved.   Around 2010, she developed episodes of sharp right sided retro-orbital pain, 10/10, lasting 3 to 10 minutes.  There is associated conjunctival injection and watery eye but no ptosis or nasal congestion.  There is no associated symptoms such as visual disturbance (specific to these attacks), unilateral facial numbness/weakness, nausea, vomiting, dizziness or focal unilateral weakness or numbness of the body.  They initially occurred once a day but then became several times daily in  2018.  There are no triggers.  Laying down and applying warm compresses helps.  She takes Tylenol.  She was previously treated at [facility] and was diagnosed with ocular migraines.  She also has sensation of water in back of the right occipital region.  She received occipital nerve blocks years ago, which was effective.     Around 2015, she developed gradual onset of vision loss and visual disturbance.  People and objects look like shadows and she sees double or triple.  When she reads, she sees several copies of the words stacked on top of each other.  She also sees floaters.  Occasionally, she sees a small yellow dot in the vision of her left eye that is intermittent, lasting just seconds.  She has been followed by [facility] for several years for glaucoma.  Due to persistent visual disturbance, she was evaluated by Dr. Alan Mulder, a retinal specialist, on 10/23/16. On Humphrey visual field testing, she exhibited severe constriction bilaterally.  Retinal exam revealed grade II hypertensive retinopathy with minimal vitreous opacities.  It was suggested that her visual field defect may be migraine-related phenomena.  I last saw her in 2018.  Sed rate and CRP from 01/01/2017 were 13 and 0.1 respectively.  MRI of brain with and without contrast on 01/21/2017 was personally reviewed and was unremarkable.  She saw neuro-ophthalmologist Dr. Gentry Roch later in 2018, who was unable to make any definitive diagnosis.  She continues to have the persistent vision loss.  Labs from 10/12/2019 showed sed rate 31 and CRP <  1.0.  She saw [facility] in June 2021.   Neuro-ophthalmic exam was normal but noted to have significant corneal dryness which could explain her decreased visual acuity.  An ultrasound of the temporal arteries was performed and was negative.  Sometimes she has a twitching in her right eye and has trouble opening it.  Not necessarily associated with headache.  Around May or  June 2022, she started getting eye pain in the left eye now as well as the right eye, occurs separately, not at same time.  They last 10-15 minutes and occur 2-3 times a day.     There is no family history of headaches or cerebral aneurysms.   Past medications:  Gabapentin (dizziness), topiramate (dizziness), verapamil (dizziness)  PAST MEDICAL HISTORY: Past Medical History:  Diagnosis Date   Allergic rhinitis    Allergy    Arthritis    Asthma    Blood transfusion without reported diagnosis    1966   Cancer (HCC)    at age 16.  abdominal   Cancer of kidney (HCC)    at age 14.  Right kidney.   Chronic headache    Edema    GERD (gastroesophageal reflux disease)    Glaucoma    Hiatal hernia    Hypertension    IBS (irritable bowel syndrome)    Internal hemorrhoid    Lumbago    Lump or mass in breast    Lung cancer (HCC)    at age 22  Right lung.   Other abnormal blood chemistry    Other and unspecified hyperlipidemia    Other convulsions    hx seizures at age 77   Peptic stricture of esophagus    Reflux esophagitis    Tubular adenoma of colon 2015   Unspecified menopausal and postmenopausal disorder    Unspecified vitamin D deficiency     MEDICATIONS: Current Outpatient Medications on File Prior to Visit  Medication Sig Dispense Refill   acetaminophen (TYLENOL) 325 MG tablet Take 325 mg by mouth as needed.     atorvastatin (LIPITOR) 10 MG tablet TAKE 1 TABLET BY MOUTH EVERY DAY 90 tablet 3   calcium-vitamin D (OSCAL 500/200 D-3) 500-200 MG-UNIT per tablet Take 1 tablet by mouth 2 (two) times daily. 60 tablet 12   [employer] E 200 units capsule Take 200 Units by mouth daily.  0   [employer] SENNA PLUS 8.6-50 MG tablet TAKE 1 TABLET BY MOUTH DAILY. 90 tablet 1   diclofenac sodium (VOLTAREN) 1 % GEL APPLY 2 G TOPICALLY 3 (THREE) TIMES DAILY AS NEEDED. 100 g 0   dicyclomine (BENTYL) 20 MG tablet Take 1 tablet (20 mg total) by mouth 4 (four) times daily -  before meals and at bedtime. 360  tablet 0   dimenhyDRINATE (DRAMAMINE) 50 MG tablet Take 50 mg by mouth every 8 (eight) hours as needed for dizziness.     fluticasone (FLONASE) 50 MCG/ACT nasal spray SPRAY 2 SPRAYS INTO EACH NOSTRIL EVERY DAY 48 mL 0   folic acid (FOLVITE) 1 MG tablet Take 1 mg by mouth daily.     hydrocortisone (ANUSOL-HC) 25 MG suppository Use one suppository twice a day for 10 days then as needed 28 suppository 1   losartan (COZAAR) 50 MG tablet Take 1 tablet (50 mg total) by mouth daily. 90 tablet 3   LUMIGAN 0.01 % SOLN 1 drop at bedtime.     metFORMIN (GLUCOPHAGE) 500 MG tablet Take 0.5 tablets (250 mg total) by mouth  daily with breakfast. 45 tablet 3   metoprolol succinate (TOPROL-XL) 25 MG 24 hr tablet Take 1 tablet (25 mg total) by mouth daily. 90 tablet 3   nortriptyline (PAMELOR) 25 MG capsule Take 1 capsule (25 mg total) by mouth at bedtime. 30 capsule 5   omeprazole (PRILOSEC) 40 MG capsule TAKE 1 CAPSULE BY MOUTH EVERY DAY 90 capsule 1   RESTASIS 0.05 % ophthalmic emulsion 1 drop 2 (two) times daily.     rosuvastatin (CRESTOR) 20 MG tablet Take 1 tablet (20 mg total) by mouth daily. 90 tablet 3   Travoprost, BAK Free, (TRAVATAN) 0.004 % SOLN ophthalmic solution 1 drop at bedtime.      No current facility-administered medications on file prior to visit.    ALLERGIES: Allergies  Allergen Reactions   Influenza Vaccines     D/t being allergic to eggs   Shrimp [Shellfish Allergy] Swelling    Lip, throat, tongue swelling   Other Itching    Newspaper Dye   Celecoxib     Able to take IBU   Codeine Swelling   Compazine [Prochlorperazine]    Gabapentin Other (See Comments)    Dizziness and somnolence   Iodine     "Has shellfish allergy, would prefer not to take"   Lisinopril Cough    cough   Peanut-Containing Drug Products     Itching   Prednisone     Rapid heart rate   Prochlorperazine Edisylate     paralyzed   Robinul [Glycopyrrolate]     boils   Vioxx [Rofecoxib]     States able  to take IBU   Aspirin Rash    States able to take IBU   Eggs Or Egg-Derived Products Rash   Epinephrine Rash   Erythromycin Rash   Sulfa Antibiotics Rash   Tetracycline Rash    FAMILY HISTORY: Family History  Problem Relation Age of Onset   Allergies Sister        x3   Asthma Sister    Bone cancer Father    Lung cancer Father    Stomach cancer Father    Esophageal cancer Father    Breast cancer Mother    Prostate cancer Brother    Pancreatic cancer Brother        mets from prostate   Colon cancer Brother    Hypertension Brother    Prostate cancer Brother    Bone cancer Brother    Diabetes Brother    Heart disease Sister    Diabetes Sister    Hypertension Sister    Breast cancer Sister        mastectomy   Stroke Sister    Rectal cancer Neg Hx    Liver cancer Neg Hx       Objective:  Blood pressure (!) 163/68, pulse 62, height 5\' 1"  (1.549 m), weight 178 lb 3.2 oz (80.8 kg), SpO2 97 %. General: No acute distress.  Patient appears well-groomed.   Head:  Normocephalic/atraumatic Eyes:  Fundi examined but not visualized Heart:  Regular rate and rhythm Neurological Exam: alert and oriented.  Speech fluent and not dysarthric, language intact.  CN II-XII intact. Bulk and tone normal, muscle strength 5/5 throughout.  Sensation to light touch intact.  Deep tendon reflexes 2+ throughout.  Finger to nose testing intact.  Gait normal, Romberg negative.   Shon Millet, DO  CC: Anne Ng, NP

## 2023-02-19 ENCOUNTER — Other Ambulatory Visit: Payer: Self-pay | Admitting: Nurse Practitioner

## 2023-02-19 ENCOUNTER — Other Ambulatory Visit: Payer: Self-pay

## 2023-02-19 ENCOUNTER — Ambulatory Visit: Payer: PPO | Admitting: Neurology

## 2023-02-19 ENCOUNTER — Encounter: Payer: Self-pay | Admitting: Neurology

## 2023-02-19 VITALS — BP 163/68 | HR 62 | Ht 61.0 in | Wt 178.2 lb

## 2023-02-19 DIAGNOSIS — I10 Essential (primary) hypertension: Secondary | ICD-10-CM

## 2023-02-19 DIAGNOSIS — M5481 Occipital neuralgia: Secondary | ICD-10-CM | POA: Diagnosis not present

## 2023-02-19 DIAGNOSIS — R42 Dizziness and giddiness: Secondary | ICD-10-CM | POA: Diagnosis not present

## 2023-02-19 DIAGNOSIS — G44039 Episodic paroxysmal hemicrania, not intractable: Secondary | ICD-10-CM | POA: Diagnosis not present

## 2023-02-19 MED ORDER — LOSARTAN POTASSIUM 100 MG PO TABS: 100.0000 mg | ORAL_TABLET | Freq: Every day | ORAL | 0 refills | Status: DC

## 2023-02-19 MED ORDER — NORTRIPTYLINE HCL 25 MG PO CAPS: 25.0000 mg | ORAL_CAPSULE | Freq: Every day | ORAL | 3 refills | Status: DC

## 2023-02-19 NOTE — Patient Instructions (Signed)
Refilled nortriptyline Have the balance clinic send their report to me Otherwise, follow up one year

## 2023-03-11 ENCOUNTER — Telehealth: Payer: Self-pay

## 2023-03-11 NOTE — Telephone Encounter (Signed)
Pt declined scheduling at our Diabetic eye exam clinic on 5/30. She stated she will go to her regular eye doctor.

## 2023-03-12 ENCOUNTER — Encounter: Payer: Self-pay | Admitting: *Deleted

## 2023-03-12 NOTE — Progress Notes (Signed)
[facility] Quality Team Note  Name: Mary Collier Date of Birth: Aug 06, 1951 MRN: [id number] Date: 03/12/2023  [facility] Quality Team has reviewed this patient's chart, please see recommendations below:  [facility] Quality Other; Pt has open gap for A1C.  It looks like she has an ov scheduled for DM follow up on 03/24/2023.  Would your provider mind addressing and ordering A1C to help close gap when pt comes in for ov?

## 2023-03-24 ENCOUNTER — Ambulatory Visit (INDEPENDENT_AMBULATORY_CARE_PROVIDER_SITE_OTHER): Payer: PPO | Admitting: Nurse Practitioner

## 2023-03-24 ENCOUNTER — Encounter: Payer: Self-pay | Admitting: Nurse Practitioner

## 2023-03-24 VITALS — BP 160/80 | HR 59 | Temp 98.2°F | Resp 16 | Ht 61.0 in | Wt 179.2 lb

## 2023-03-24 DIAGNOSIS — E1165 Type 2 diabetes mellitus with hyperglycemia: Secondary | ICD-10-CM | POA: Diagnosis not present

## 2023-03-24 DIAGNOSIS — K589 Irritable bowel syndrome without diarrhea: Secondary | ICD-10-CM | POA: Insufficient documentation

## 2023-03-24 DIAGNOSIS — I700 Atherosclerosis of aorta: Secondary | ICD-10-CM

## 2023-03-24 DIAGNOSIS — E785 Hyperlipidemia, unspecified: Secondary | ICD-10-CM | POA: Diagnosis not present

## 2023-03-24 DIAGNOSIS — E1169 Type 2 diabetes mellitus with other specified complication: Secondary | ICD-10-CM | POA: Diagnosis not present

## 2023-03-24 DIAGNOSIS — I10 Essential (primary) hypertension: Secondary | ICD-10-CM

## 2023-03-24 DIAGNOSIS — I3481 Nonrheumatic mitral (valve) annulus calcification: Secondary | ICD-10-CM

## 2023-03-24 LAB — MICROALBUMIN / CREATININE URINE RATIO
Creatinine,U: 134.4 mg/dL
Microalb Creat Ratio: 0.7 mg/g (ref 0.0–30.0)
Microalb, Ur: 1 mg/dL (ref 0.0–1.9)

## 2023-03-24 LAB — LIPID PANEL
Cholesterol: 151 mg/dL (ref 0–200)
HDL: 47.3 mg/dL (ref >39.00)
LDL Cholesterol: 76 mg/dL (ref 0–99)
NonHDL: 103.65
Total CHOL/HDL Ratio: 3
Triglycerides: 140 mg/dL (ref 0.0–149.0)
VLDL: 28 mg/dL (ref 0.0–40.0)

## 2023-03-24 LAB — HEMOGLOBIN A1C: Hgb A1c MFr Bld: 6.3 % (ref 4.6–6.5)

## 2023-03-24 MED ORDER — METOPROLOL SUCCINATE ER 25 MG PO TB24: 25.0000 mg | ORAL_TABLET | Freq: Every day | ORAL | 3 refills | Status: DC

## 2023-03-24 NOTE — Assessment & Plan Note (Signed)
She reports difference in BP reading between right and left arm, about >20pts higher in right arm. Reports elder sister (deceased) with subclavian steal syndrome and heart murmur. Reports she is compliant with losartan and metoprolol. Repeat BP check in both arm in supine position: Left arm BP 120/80 and Right arm BP 160/80 BP Readings from Last 3 Encounters:  03/24/23 (!) 160/80  02/19/23 (!) 163/68  09/23/22 (!) 204/91    Ordered duplex arterial US and ref to cardiology. Maintain current med doses

## 2023-03-24 NOTE — Assessment & Plan Note (Signed)
Echo 2022:EF by estimation, is 60 to 65%. The left ventricle has normal function. The left ventricle has no regional wall motion abnormalities. There is mild left ventricular hypertrophy.  Left ventricular diastolic parameters are consistent with Grade I diastolic dysfunction (impaired relaxation). Elevated left ventricular end-diastolic pressure. The E/e' is 29. The mitral valve is abnormal. Trivial mitral valve regurgitation. Moderate mitral annular calcification. The aortic valve is tricuspid. Aortic valve regurgitation is not visualized.   Asymptomatic at this time

## 2023-03-24 NOTE — Assessment & Plan Note (Signed)
Maintain crestor dose Repeat lipid panel

## 2023-03-24 NOTE — Patient Instructions (Addendum)
Schedule DIABETES eye exam and have eye exam report faxed to me.  Go to lab

## 2023-03-24 NOTE — Progress Notes (Deleted)
Per request of Patient. I administered the Freestyle libre 3 monitoring system to the left arm.  I educated her on how to use the meter and downloaded the freestyle app on her iPhone.

## 2023-03-24 NOTE — Assessment & Plan Note (Signed)
Last echo 2022: The aortic root and ascending aorta are structurally normal, with  no evidence of dilitation   Maintain crestor dose Repeat lipid panel Refer to cardiology

## 2023-03-24 NOTE — Progress Notes (Signed)
Established Patient Visit  Patient: Mary Collier   DOB: [birth year]   72 y.o. Female  MRN: [id number] Visit Date: 03/24/2023  Subjective:    Chief Complaint  Patient presents with   Diabetes    Fasting- She will call to schedule eye exam    Hypertension    Refill of Metoprolol.    HPI Hypertensive disorder She reports difference in BP reading between right and left arm, about >20pts higher in right arm. Reports elder sister (deceased) with subclavian steal syndrome and heart murmur. Reports she is compliant with losartan and metoprolol. Repeat BP check in both arm in supine position: Left arm BP 120/80 and Right arm BP 160/80 BP Readings from Last 3 Encounters:  03/24/23 (!) 160/80  02/19/23 (!) 163/68  09/23/22 (!) 204/91    Ordered duplex arterial US and ref to cardiology. Maintain current med doses  Aortic atherosclerosis (HCC) Last echo 2022: The aortic root and ascending aorta are structurally normal, with  no evidence of dilitation   Maintain crestor dose Repeat lipid panel Refer to cardiology  Hyperlipidemia associated with type 2 diabetes mellitus (HCC) Maintain crestor dose Repeat lipid panel  Mitral valve annular calcification Echo 2022:EF by estimation, is 60 to 65%. The left ventricle has normal function. The left ventricle has no regional wall motion abnormalities. There is mild left ventricular hypertrophy.  Left ventricular diastolic parameters are consistent with Grade I diastolic dysfunction (impaired relaxation). Elevated left ventricular end-diastolic pressure. The E/e' is 85. The mitral valve is abnormal. Trivial mitral valve regurgitation. Moderate mitral annular calcification. The aortic valve is tricuspid. Aortic valve regurgitation is not visualized.   Asymptomatic at this time  Report shingrix outbreak 62month ago. Advised to schedule nurse visit for shingrix vaccine in 2months   Reviewed medical, surgical, and social history  today  Medications: Outpatient Medications Prior to Visit  Medication Sig   acetaminophen (TYLENOL) 325 MG tablet Take 325 mg by mouth as needed.   calcium-vitamin D (OSCAL 500/200 D-3) 500-200 MG-UNIT per tablet Take 1 tablet by mouth 2 (two) times daily.   [facility] SENNA PLUS 8.6-50 MG tablet TAKE 1 TABLET BY MOUTH DAILY.   diclofenac sodium (VOLTAREN) 1 % GEL APPLY 2 G TOPICALLY 3 (THREE) TIMES DAILY AS NEEDED.   dimenhyDRINATE (DRAMAMINE) 50 MG tablet Take 50 mg by mouth every 8 (eight) hours as needed for dizziness.   fluticasone (FLONASE) 50 MCG/ACT nasal spray SPRAY 2 SPRAYS INTO EACH NOSTRIL EVERY DAY   folic acid (FOLVITE) 1 MG tablet Take 1 mg by mouth daily.   hydrocortisone (ANUSOL-HC) 25 MG suppository USE ONE SUPPOSITORY TWICE A DAY FOR 10 DAYS THEN AS NEEDED   ibuprofen (ADVIL) 800 MG tablet Take 1 tablet (800 mg total) by mouth every 8 (eight) hours as needed.   ipratropium (ATROVENT) 0.03 % nasal spray    losartan (COZAAR) 100 MG tablet Take 1 tablet (100 mg total) by mouth daily.   LUMIGAN 0.01 % SOLN 1 drop at bedtime.   metFORMIN (GLUCOPHAGE) 500 MG tablet Take 0.5 tablets (250 mg total) by mouth daily with breakfast.   nortriptyline (PAMELOR) 25 MG capsule Take 1 capsule (25 mg total) by mouth at bedtime.   omeprazole (PRILOSEC) 40 MG capsule TAKE 1 CAPSULE BY MOUTH EVERY DAY   Phenylephrine HCl (PREPARATION H) 0.25 % SUPP Place rectally daily as needed.   RESTASIS 0.05 % ophthalmic emulsion 1 drop 2 (  two) times daily.   rosuvastatin (CRESTOR) 20 MG tablet Take 1 tablet (20 mg total) by mouth daily.   Travoprost, BAK Free, (TRAVATAN) 0.004 % SOLN ophthalmic solution 1 drop at bedtime.    [DISCONTINUED] metoprolol succinate (TOPROL-XL) 25 MG 24 hr tablet Take 1 tablet (25 mg total) by mouth daily.   dicyclomine (BENTYL) 20 MG tablet TAKE 1 TABLET (20 MG TOTAL) BY MOUTH 4 (FOUR) TIMES DAILY - BEFORE MEALS AND AT BEDTIME. (Patient taking differently: Take 20 mg by mouth as  needed.)   No facility-administered medications prior to visit.   Reviewed past medical and social history.   ROS per HPI above      Objective:  BP (!) 160/80 (BP Location: Right Arm, Patient Position: Supine)   Pulse (!) 59   Temp 98.2 F (36.8 C) (Temporal)   Resp 16   Ht 5\' 1"  (1.549 m)   Wt 179 lb 3.2 oz (81.3 kg)   SpO2 98%   BMI 33.86 kg/m      Physical Exam Cardiovascular:     Rate and Rhythm: Normal rate and regular rhythm.     Pulses: Normal pulses.          Carotid pulses are 2+ on the right side and 2+ on the left side.      Radial pulses are 2+ on the right side and 2+ on the left side.       Femoral pulses are 2+ on the right side and 2+ on the left side.      Dorsalis pedis pulses are 2+ on the right side and 2+ on the left side.       Posterior tibial pulses are 2+ on the right side and 2+ on the left side.     Heart sounds: Murmur heard.  Pulmonary:     Effort: Pulmonary effort is normal.     Breath sounds: Normal breath sounds.  Musculoskeletal:     Right lower leg: No edema.     Left lower leg: No edema.  Neurological:     Mental Status: She is alert and oriented to person, place, and time.     Results for orders placed or performed in visit on 03/24/23  Hemoglobin A1c  Result Value Ref Range   Hgb A1c MFr Bld 6.3 4.6 - 6.5 %  Microalbumin / creatinine urine ratio  Result Value Ref Range   Microalb, Ur 1.0 0.0 - 1.9 mg/dL   Creatinine,U 161.0 mg/dL   Microalb Creat Ratio 0.7 0.0 - 30.0 mg/g  Lipid panel  Result Value Ref Range   Cholesterol 151 0 - 200 mg/dL   Triglycerides 960.4 0.0 - 149.0 mg/dL   HDL 54.09 >81.19 mg/dL   VLDL 14.7 0.0 - 82.9 mg/dL   LDL Cholesterol 76 0 - 99 mg/dL   Total CHOL/HDL Ratio 3    NonHDL 103.65       Assessment & Plan:    Problem List Items Addressed This Visit       Cardiovascular and Mediastinum   Aortic atherosclerosis (HCC)    Last echo 2022: The aortic root and ascending aorta are structurally  normal, with  no evidence of dilitation   Maintain crestor dose Repeat lipid panel Refer to cardiology      Relevant Medications   metoprolol succinate (TOPROL-XL) 25 MG 24 hr tablet   Other Relevant Orders   Lipid panel (Completed)   Ambulatory referral to Cardiology   VAS [place] UPPER EXTREMITY ARTERIAL DUPLEX  Hypertensive disorder - Primary    She reports difference in BP reading between right and left arm, about >20pts higher in right arm. Reports elder sister (deceased) with subclavian steal syndrome and heart murmur. Reports she is compliant with losartan and metoprolol. Repeat BP check in both arm in supine position: Left arm BP 120/80 and Right arm BP 160/80 BP Readings from Last 3 Encounters:  03/24/23 (!) 160/80  02/19/23 (!) 163/68  09/23/22 (!) 204/91    Ordered duplex arterial US and ref to cardiology. Maintain current med doses      Relevant Medications   metoprolol succinate (TOPROL-XL) 25 MG 24 hr tablet   Other Relevant Orders   Ambulatory referral to Cardiology   VAS [place] UPPER EXTREMITY ARTERIAL DUPLEX   Mitral valve annular calcification    Echo 2022:EF by estimation, is 60 to 65%. The left ventricle has normal function. The left ventricle has no regional wall motion abnormalities. There is mild left ventricular hypertrophy.  Left ventricular diastolic parameters are consistent with Grade I diastolic dysfunction (impaired relaxation). Elevated left ventricular end-diastolic pressure. The E/e' is 50. The mitral valve is abnormal. Trivial mitral valve regurgitation. Moderate mitral annular calcification. The aortic valve is tricuspid. Aortic valve regurgitation is not visualized.   Asymptomatic at this time      Relevant Medications   metoprolol succinate (TOPROL-XL) 25 MG 24 hr tablet   Other Relevant Orders   Ambulatory referral to Cardiology   VAS [place] UPPER EXTREMITY ARTERIAL DUPLEX     Endocrine   DM (diabetes mellitus) (HCC)   Relevant Orders    Hemoglobin A1c (Completed)   Microalbumin / creatinine urine ratio (Completed)   Hyperlipidemia associated with type 2 diabetes mellitus (HCC)    Maintain crestor dose Repeat lipid panel      Relevant Medications   metoprolol succinate (TOPROL-XL) 25 MG 24 hr tablet   Other Relevant Orders   Lipid panel (Completed)   Ambulatory referral to Cardiology   VAS [place] UPPER EXTREMITY ARTERIAL DUPLEX   Return in about 6 months (around 09/23/2023) for HTN, DM, hyperlipidemia (fasting).     Alysia Penna, NP

## 2023-03-26 ENCOUNTER — Telehealth: Payer: Self-pay | Admitting: Nurse Practitioner

## 2023-03-26 ENCOUNTER — Other Ambulatory Visit: Payer: Self-pay | Admitting: Nurse Practitioner

## 2023-03-26 DIAGNOSIS — I700 Atherosclerosis of aorta: Secondary | ICD-10-CM

## 2023-03-26 DIAGNOSIS — I10 Essential (primary) hypertension: Secondary | ICD-10-CM

## 2023-03-26 DIAGNOSIS — E782 Mixed hyperlipidemia: Secondary | ICD-10-CM

## 2023-03-26 DIAGNOSIS — E1165 Type 2 diabetes mellitus with hyperglycemia: Secondary | ICD-10-CM

## 2023-03-26 MED ORDER — AMLODIPINE BESYLATE 5 MG PO TABS: 5.0000 mg | ORAL_TABLET | Freq: Every day | ORAL | 5 refills | Status: DC

## 2023-03-26 MED ORDER — METFORMIN HCL 500 MG PO TABS: 250.0000 mg | ORAL_TABLET | Freq: Every day | ORAL | 3 refills | Status: DC

## 2023-03-26 MED ORDER — ROSUVASTATIN CALCIUM 20 MG PO TABS: 20.0000 mg | ORAL_TABLET | Freq: Every day | ORAL | 3 refills | Status: DC

## 2023-03-26 NOTE — Telephone Encounter (Signed)
Advised to stop metoprolol since no hx of CHF or MI. She is wean off med by taking 1/2tab daily x 1week, then 1/2tab EOD then stop Start amlodipine 5mg  at hs to minimize ankle edema Maintain appointment with cardiology 06/06/23 and for duplex US of UE on 04/02/23.

## 2023-04-01 ENCOUNTER — Other Ambulatory Visit: Payer: Self-pay | Admitting: Nurse Practitioner

## 2023-04-01 DIAGNOSIS — I3481 Nonrheumatic mitral (valve) annulus calcification: Secondary | ICD-10-CM

## 2023-04-01 DIAGNOSIS — I10 Essential (primary) hypertension: Secondary | ICD-10-CM

## 2023-04-01 DIAGNOSIS — R030 Elevated blood-pressure reading, without diagnosis of hypertension: Secondary | ICD-10-CM

## 2023-04-01 DIAGNOSIS — E1165 Type 2 diabetes mellitus with hyperglycemia: Secondary | ICD-10-CM

## 2023-04-01 DIAGNOSIS — E1169 Type 2 diabetes mellitus with other specified complication: Secondary | ICD-10-CM

## 2023-04-01 DIAGNOSIS — I700 Atherosclerosis of aorta: Secondary | ICD-10-CM

## 2023-04-02 ENCOUNTER — Ambulatory Visit (HOSPITAL_COMMUNITY)
Admission: RE | Admit: 2023-04-02 | Discharge: 2023-04-02 | Disposition: A | Discharge status: Home / Self Care | Payer: PPO | Source: Ambulatory Visit | Attending: Nurse Practitioner | Admitting: Nurse Practitioner

## 2023-04-02 DIAGNOSIS — E1169 Type 2 diabetes mellitus with other specified complication: Secondary | ICD-10-CM | POA: Diagnosis present

## 2023-04-02 DIAGNOSIS — I700 Atherosclerosis of aorta: Secondary | ICD-10-CM | POA: Diagnosis not present

## 2023-04-02 DIAGNOSIS — R030 Elevated blood-pressure reading, without diagnosis of hypertension: Secondary | ICD-10-CM

## 2023-04-02 DIAGNOSIS — I10 Essential (primary) hypertension: Secondary | ICD-10-CM | POA: Insufficient documentation

## 2023-04-02 DIAGNOSIS — E785 Hyperlipidemia, unspecified: Secondary | ICD-10-CM

## 2023-04-15 ENCOUNTER — Encounter: Payer: Self-pay | Admitting: Podiatry

## 2023-04-15 ENCOUNTER — Ambulatory Visit: Payer: PPO | Admitting: Podiatry

## 2023-04-15 VITALS — BP 188/79

## 2023-04-15 DIAGNOSIS — B351 Tinea unguium: Secondary | ICD-10-CM

## 2023-04-15 DIAGNOSIS — L84 Corns and callosities: Secondary | ICD-10-CM | POA: Diagnosis not present

## 2023-04-15 DIAGNOSIS — M79674 Pain in right toe(s): Secondary | ICD-10-CM

## 2023-04-15 DIAGNOSIS — E1165 Type 2 diabetes mellitus with hyperglycemia: Secondary | ICD-10-CM

## 2023-04-15 DIAGNOSIS — Q828 Other specified congenital malformations of skin: Secondary | ICD-10-CM | POA: Diagnosis not present

## 2023-04-15 DIAGNOSIS — M79675 Pain in left toe(s): Secondary | ICD-10-CM

## 2023-04-16 ENCOUNTER — Telehealth: Payer: Self-pay | Admitting: Nurse Practitioner

## 2023-04-16 NOTE — Telephone Encounter (Signed)
Pt called and said that she has not started the new blood pressure yet because the pharmacy did not have it in so pt continued taking her old blood pressure meds. Pt said the pharmacy has the new prescription in now and she will pick it up and start using them tonight

## 2023-04-20 NOTE — Progress Notes (Signed)
Subjective:  Patient ID: Mary Collier, female    DOB: 08-22-1951,  MRN: [id number]  Mary Collier presents to clinic today for: preventative diabetic foot care and callus(es) both feet and painful thick toenails that are difficult to trim. Painful toenails interfere with ambulation. Aggravating factors include wearing enclosed shoe gear. Pain is relieved with periodic professional debridement. Painful calluses are aggravated when weightbearing with and without shoegear. Pain is relieved with periodic professional debridement.  Chief Complaint  Patient presents with   Nail Problem    RFC,Referring Provider Nche, Bonna Gains, NP,lov:3 weeks ago       PCP is Nche, Bonna Gains, NP.  Allergies  Allergen Reactions   Influenza Vaccines     D/t being allergic to eggs   Shellfish Allergy Swelling    Lip, throat, tongue swelling  Other Reaction(s): Respiratory Distress   Tree Extract Anaphylaxis   Other Itching    Newspaper Dye   Celecoxib     Able to take IBU   Codeine Swelling   Compazine [Prochlorperazine]    Gabapentin Other (See Comments)    Dizziness and somnolence   Iodine     "Has shellfish allergy, would prefer not to take"   Lisinopril Cough    cough   Peanut-Containing Drug Products     Itching  Other Reaction(s): Other (See Comments)   Prednisone     Rapid heart rate   Prochlorperazine Edisylate     paralyzed   Robinul [Glycopyrrolate]     boils   Shrimp Extract Other (See Comments)    Other reaction(s): Not available   Vioxx [Rofecoxib]     States able to take IBU   Aspirin Rash    States able to take IBU   Egg-Derived Products Rash   Epinephrine Rash   Erythromycin Rash   Sulfa Antibiotics Rash    Other Reaction(s): Other (See Comments)   Tetracycline Rash    Review of Systems: Negative except as noted in the HPI.  Objective: No changes noted in today's physical examination. Vitals:   04/15/23 1627 04/15/23 1639  BP: (!) 216/83 (!) 188/79     Mary Collier is a pleasant 72 y.o. female in NAD. AAO x 3.  Vascular Examination: Capillary refill time <3 seconds b/l LE. Palpable pedal pulses b/l LE. Digital hair sparse b/l. No pedal edema b/l. Skin temperature gradient WNL b/l. No varicosities b/l. Marland Kitchen  Dermatological Examination: Pedal skin with normal turgor, texture and tone b/l. No open wounds. No interdigital macerations b/l. Toenails 2-5 b/l thickened, discolored, dystrophic with subungual debris. There is pain on palpation to dorsal aspect of nailplates. Anonychia noted bilateral great toes. Nailbed(s) epithelialized.  Hyperkeratotic lesion(s) nailbed of left hallux and submet head 5 b/l.  No erythema, no edema, no drainage, no fluctuance. Porokeratotic lesion(s) nailbed left hallux. No erythema, no edema, no drainage, no fluctuance..  Neurological Examination: Protective sensation intact with 10 gram monofilament b/l LE.  Musculoskeletal Examination: Normal muscle strength 5/5 to all lower extremity muscle groups bilaterally. No pain, crepitus or joint limitation noted with ROM b/l LE. No gross bony pedal deformities b/l. Patient ambulates independently without assistive aids.     Latest Ref Rng & Units 03/24/2023   10:55 AM 09/22/2022    8:27 AM 06/11/2022   10:27 AM  Hemoglobin A1C  Hemoglobin-A1c 4.6 - 6.5 % 6.3  5.9  5.9    Assessment/Plan: 1. Pain due to onychomycosis of toenails of both feet   2. Callus  3. Porokeratosis   4. Type 2 diabetes mellitus with hyperglycemia, without long-term current use of insulin (HCC)    -Patient was evaluated and treated. All patient's and/or POA's questions/concerns answered on today's visit. -Toenails 2-5 bilaterally were debrided in length and girth with sterile nail nippers without iatrogenic bleeding.  -Callus(es) submet head 5 b/l pared utilizing sterile scalpel blade without complication or incident. Total number debrided =2. -Porokeratotic lesion(s) dorsal nailbed left  hallux pared and enucleated with sterile currette without incident. Total number of lesions debrided=1. -Patient/POA to call should there be question/concern in the interim.   Return in about 3 months (around 07/16/2023).  Freddie Breech, DPM

## 2023-05-21 NOTE — Progress Notes (Signed)
CARDIOLOGY CONSULT NOTE       Patient ID: HALYN FLAUGHER MRN: [id number] DOB/AGE: Apr 15, 1951 72 y.o.  Admit date: (Not on file) Referring Physician: Nche Primary Physician: Anne Ng, NP Primary Cardiologist: New Reason for Consultation: CAD    HPI:  72 y.o. referred by NP Nche for CAD and risk factors. She is diabetic with HTN and HLD She indicates left systolic BP 20 mmHg less than right.  I do not appreciate any murmurs on exam. She had an echo in 2022 that noted trivial MR with moderate mitral annular calcification. She has no symptomatic clinical CAD or vascular dx. Carotid duplex 04/02/23 did not show subclavian steal with antegrade vertebral flow and only 1-39% plaque in ICA;s  Brothers and sisters with MI CVA.  Chest discomfort recently while getting ready for dinner. Sharp in middle of chest. Has GERD as well. Felt in throat as well. 10 min duration. Took PPI and eventually improved.   No tob never   ROS All other systems reviewed and negative except as noted above  Past Medical History:  Diagnosis Date   Allergic rhinitis    Allergy    Arthritis    Asthma    Blood transfusion without reported diagnosis    1966   Cancer (HCC)    at age 17.  abdominal   Cancer of kidney (HCC)    at age 35.  Right kidney.   Chronic headache    Dizziness 03/11/2022   Edema    GERD (gastroesophageal reflux disease)    Glaucoma    Hiatal hernia    Hypertension    IBS (irritable bowel syndrome)    Internal hemorrhoid    Lumbago    Lump or mass in breast    Lung cancer (HCC)    at age 52  Right lung.   Other abnormal blood chemistry    Other and unspecified hyperlipidemia    Other convulsions    hx seizures at age 27   Peptic stricture of esophagus    Reflux esophagitis    Tubular adenoma of colon 10/20/2013   Unspecified menopausal and postmenopausal disorder    Unspecified vitamin D deficiency     Family History  Problem Relation Age of Onset   Allergies  Sister        x3   Asthma Sister    Bone cancer Father    Lung cancer Father    Stomach cancer Father    Esophageal cancer Father    Breast cancer Mother    Prostate cancer Brother    Pancreatic cancer Brother        mets from prostate   Colon cancer Brother    Hypertension Brother    Prostate cancer Brother    Bone cancer Brother    Diabetes Brother    Heart disease Sister    Diabetes Sister    Hypertension Sister    Breast cancer Sister        mastectomy   Stroke Sister    Rectal cancer Neg Hx    Liver cancer Neg Hx     Social History   Socioeconomic History   Marital status: Married    Spouse name: Not on file   Number of children: 0   Years of education: Not on file   Highest education level: Bachelor's degree (e.g., BA, AB, BS)  Occupational History   Occupation: education specialist (preschool)   Tobacco Use   Smoking status: Never   Smokeless tobacco:  Never  Vaping Use   Vaping status: Never Used  Substance and Sexual Activity   Alcohol use: No   Drug use: No   Sexual activity: Not on file  Other Topics Concern   Not on file  Social History Narrative   ** Merged History Encounter **       Pt has 18 siblings. No caffeine drinks  Right handed One story home   Social Determinants of Health   Financial Resource Strain: Low Risk  (08/25/2022)   Overall Financial Resource Strain (CARDIA)    Difficulty of Paying Living Expenses: Not hard at all  Food Insecurity: No Food Insecurity (08/25/2022)   Hunger Vital Sign    Worried About Running Out of Food in the Last Year: Never true    Ran Out of Food in the Last Year: Never true  Transportation Needs: No Transportation Needs (03/23/2023)   PRAPARE - Administrator, Civil Service (Medical): No    Lack of Transportation (Non-Medical): No  Physical Activity: Insufficiently Active (03/23/2023)   Exercise Vital Sign    Days of Exercise per Week: 2 days    Minutes of Exercise per Session: 30 min   Stress: No Stress Concern Present (03/23/2023)   [employer] of Occupational Health - Occupational Stress Questionnaire    Feeling of Stress : Only a little  Social Connections: Socially Integrated (03/23/2023)   Social Connection and Isolation Panel [NHANES]    Frequency of Communication with Friends and Family: Twice a week    Frequency of Social Gatherings with Friends and Family: More than three times a week    Attends Religious Services: More than 4 times per year    Active Member of [employer] or Organizations: Yes    Attends Banker Meetings: 1 to 4 times per year    Marital Status: Married  Catering manager Violence: Unknown (09/05/2022)   Received from [employer], [facility]   HITS    Physically Hurt: Not on file    Insult or Talk Down To: Not on file    Threaten Physical Harm: Not on file    Scream or Curse: Not on file    Past Surgical History:  Procedure Laterality Date   ABDOMINAL SURGERY  at age 2    BREAST SURGERY  44s and 91s   Bilateral cysts removed   KIDNEY SURGERY  at age 71   Right   LUNG SURGERY  at age 24   Right   NASAL SINUS SURGERY  1980s   TONSILLECTOMY  1970   TOTAL ABDOMINAL HYSTERECTOMY  2000s      Current Outpatient Medications:    acetaminophen (TYLENOL) 325 MG tablet, Take 325 mg by mouth as needed., Disp: , Rfl:    amLODipine (NORVASC) 5 MG tablet, Take 1 tablet (5 mg total) by mouth at bedtime., Disp: 30 tablet, Rfl: 5   calcium-vitamin D (OSCAL 500/200 D-3) 500-200 MG-UNIT per tablet, Take 1 tablet by mouth 2 (two) times daily., Disp: 60 tablet, Rfl: 12   [employer] SENNA PLUS 8.6-50 MG tablet, TAKE 1 TABLET BY MOUTH DAILY., Disp: 90 tablet, Rfl: 1   diclofenac sodium (VOLTAREN) 1 % GEL, APPLY 2 G TOPICALLY 3 (THREE) TIMES DAILY AS NEEDED., Disp: 100 g, Rfl: 0   dimenhyDRINATE (DRAMAMINE) 50 MG tablet, Take 50 mg by mouth every 8 (eight) hours as needed for dizziness., Disp: , Rfl:    fluticasone (FLONASE) 50 MCG/ACT nasal  spray, SPRAY 2 SPRAYS INTO [facility]  NOSTRIL EVERY DAY, Disp: 48 mL, Rfl: 0   folic acid (FOLVITE) 1 MG tablet, Take 1 mg by mouth daily., Disp: , Rfl:    hydrochlorothiazide (HYDRODIURIL) 25 MG tablet, Take 1 tablet (25 mg total) by mouth daily., Disp: 90 tablet, Rfl: 3   hydrocortisone (ANUSOL-HC) 25 MG suppository, USE ONE SUPPOSITORY TWICE A DAY FOR 10 DAYS THEN AS NEEDED, Disp: 28 suppository, Rfl: 1   ibuprofen (ADVIL) 800 MG tablet, Take 1 tablet (800 mg total) by mouth every 8 (eight) hours as needed., Disp: 30 tablet, Rfl: 0   ipratropium (ATROVENT) 0.03 % nasal spray, , Disp: , Rfl:    losartan (COZAAR) 100 MG tablet, Take 1 tablet (100 mg total) by mouth daily., Disp: 90 tablet, Rfl: 0   LUMIGAN 0.01 % SOLN, 1 drop at bedtime., Disp: , Rfl:    metFORMIN (GLUCOPHAGE) 500 MG tablet, Take 0.5 tablets (250 mg total) by mouth daily with breakfast., Disp: 45 tablet, Rfl: 3   nortriptyline (PAMELOR) 25 MG capsule, Take 1 capsule (25 mg total) by mouth at bedtime., Disp: 90 capsule, Rfl: 3   omeprazole (PRILOSEC) 40 MG capsule, TAKE 1 CAPSULE BY MOUTH EVERY DAY, Disp: 90 capsule, Rfl: 1   Phenylephrine HCl (PREPARATION H) 0.25 % SUPP, Place rectally daily as needed., Disp: , Rfl:    RESTASIS 0.05 % ophthalmic emulsion, 1 drop 2 (two) times daily., Disp: , Rfl:    rosuvastatin (CRESTOR) 20 MG tablet, Take 1 tablet (20 mg total) by mouth daily., Disp: 90 tablet, Rfl: 3   Travoprost, BAK Free, (TRAVATAN) 0.004 % SOLN ophthalmic solution, 1 drop at bedtime. , Disp: , Rfl:    dicyclomine (BENTYL) 20 MG tablet, TAKE 1 TABLET (20 MG TOTAL) BY MOUTH 4 (FOUR) TIMES DAILY - BEFORE MEALS AND AT BEDTIME. (Patient taking differently: Take 20 mg by mouth as needed.), Disp: 360 tablet, Rfl: 0    Physical Exam: Blood pressure (!) 182/86, pulse 65, height 5\' 1"  (1.549 m), weight 178 lb 9.6 oz (81 kg), SpO2 98%.   Affect appropriate Healthy:  appears stated age HEENT: normal Neck supple with no adenopathy JVP  normal no bruits no thyromegaly Lungs clear with no wheezing and good diaphragmatic motion Heart:  S1/S2 no murmur, no rub, gallop or click PMI normal Abdomen: benighn, BS positve, no tenderness, no AAA no bruit.  No HSM or HJR Distal pulses intact with no bruits No edema Neuro non-focal Skin warm and dry No muscular weakness   Labs:   Lab Results  Component Value Date   WBC 6.9 07/22/2022   HGB 12.7 07/22/2022   HCT 37.9 07/22/2022   MCV 87.3 07/22/2022   PLT 198.0 07/22/2022   No results for input(s): "NA", "K", "CL", "CO2", "BUN", "CREATININE", "CALCIUM", "PROT", "BILITOT", "ALKPHOS", "ALT", "AST", "GLUCOSE" in the last 168 hours.  Invalid input(s): "LABALBU" Lab Results  Component Value Date   CKTOTAL 147 03/11/2022    Lab Results  Component Value Date   CHOL 151 03/24/2023   CHOL 117 03/11/2022   CHOL 123 06/13/2021   Lab Results  Component Value Date   HDL 47.30 03/24/2023   HDL 39.80 03/11/2022   HDL 38.90 (L) 06/13/2021   Lab Results  Component Value Date   LDLCALC 76 03/24/2023   LDLCALC 50 03/11/2022   LDLCALC 50 06/13/2021   Lab Results  Component Value Date   TRIG 140.0 03/24/2023   TRIG 138.0 03/11/2022   TRIG 171.0 (H) 06/13/2021   Lab Results  Component Value Date   CHOLHDL 3 03/24/2023   CHOLHDL 3 03/11/2022   CHOLHDL 3 06/13/2021   Lab Results  Component Value Date   LDLDIRECT 59.0 06/13/2021      Radiology: No results found.  EKG: EKG Interpretation Date/Time:  Wednesday May 27 2023 16:50:20 EDT Ventricular Rate:  65 PR Interval:  154 QRS Duration:  70 QT Interval:  426 QTC Calculation: 443 R Axis:   6  Text Interpretation: Normal sinus rhythm Minimal voltage criteria for LVH, may be normal variant ( R in aVL ) Inferior infarct , age undetermined Cannot rule out Anterior infarct , age undetermined When compared with ECG of 07-Dec-2004 18:42, Inferior infarct is now Present T wave inversion more evident in Inferior leads  Confirmed by Donato Schultz ([id number]) on 05/27/2023 4:58:52 PM   Cardiac Studies & Procedures       ECHOCARDIOGRAM  ECHOCARDIOGRAM COMPLETE 04/02/2021  Narrative ECHOCARDIOGRAM REPORT    Patient Name:   Ladoris Gene Date of Exam: 04/02/2021 Medical Rec #:  [id number]     Height:       61.0 in Accession #:    [id number]    Weight:       183.8 lb Date of Birth:  22-Jan-1951     BSA:          1.822 m Patient Age:    70 years      BP:           140/84 mmHg Patient Gender: F             HR:           67 bpm. Exam Location:  [address]  Procedure: 2D Echo and Strain Analysis  Indications:    Cardiomegaly I51.7  History:        Patient has no prior history of Echocardiogram examinations. Risk Factors:Hypertension.  Sonographer:    Thurman Coyer RDCS Referring Phys: [id number] [place]OTTE LUM NCHE  IMPRESSIONS   1. Left ventricular ejection fraction, by estimation, is 60 to 65%. The left ventricle has normal function. The left ventricle has no regional wall motion abnormalities. There is mild left ventricular hypertrophy. Left ventricular diastolic parameters are consistent with Grade I diastolic dysfunction (impaired relaxation). Elevated left ventricular end-diastolic pressure. The E/e' is 18. 2. Right ventricular systolic function is normal. The right ventricular size is normal. 3. The mitral valve is abnormal. Trivial mitral valve regurgitation. Moderate mitral annular calcification. 4. The aortic valve is tricuspid. Aortic valve regurgitation is not visualized.  Comparison(s): No prior Echocardiogram.  FINDINGS Left Ventricle: Left ventricular ejection fraction, by estimation, is 60 to 65%. The left ventricle has normal function. The left ventricle has no regional wall motion abnormalities. The left ventricular internal cavity size was normal in size. There is mild left ventricular hypertrophy. Left ventricular diastolic parameters are consistent with Grade I diastolic dysfunction  (impaired relaxation). Elevated left ventricular end-diastolic pressure. The E/e' is 59.  Right Ventricle: The right ventricular size is normal. No increase in right ventricular wall thickness. Right ventricular systolic function is normal.  Left Atrium: Left atrial size was normal in size.  Right Atrium: Right atrial size was normal in size.  Pericardium: There is no evidence of pericardial effusion.  Mitral Valve: The mitral valve is abnormal. Moderate mitral annular calcification. Trivial mitral valve regurgitation.  Tricuspid Valve: The tricuspid valve is grossly normal. Tricuspid valve regurgitation is trivial.  Aortic Valve: The aortic valve is tricuspid. Aortic valve regurgitation is not  visualized.  Pulmonic Valve: The pulmonic valve was not well visualized. Pulmonic valve regurgitation is not visualized.  Aorta: The aortic root and ascending aorta are structurally normal, with no evidence of dilitation.  Venous: The inferior vena cava was not well visualized.  IAS/Shunts: No atrial level shunt detected by color flow Doppler.   LEFT VENTRICLE PLAX 2D LVIDd:         4.40 cm  Diastology LVIDs:         3.20 cm  LV e' medial:    5.43 cm/s LV PW:         1.10 cm  LV E/e' medial:  19.3 LV IVS:        1.10 cm  LV e' lateral:   5.84 cm/s LVOT diam:     2.00 cm  LV E/e' lateral: 18.0 LV SV:         79 LV SV Index:   43 LVOT Area:     3.14 cm   RIGHT VENTRICLE TAPSE (M-mode): 1.8 cm  LEFT ATRIUM             Index       RIGHT ATRIUM           Index LA diam:        3.80 cm 2.09 cm/m  RA Area:     12.50 cm LA Vol (A2C):   56.9 ml 31.23 ml/m RA Volume:   24.90 ml  13.67 ml/m LA Vol (A4C):   45.1 ml 24.75 ml/m LA Biplane Vol: 52.1 ml 28.59 ml/m AORTIC VALVE LVOT Vmax:   96.70 cm/s LVOT Vmean:  70.500 cm/s LVOT VTI:    0.252 m  AORTA Ao Root diam: 2.20 cm Ao Asc diam:  3.00 cm  MITRAL VALVE MV Area (PHT): 2.80 cm     SHUNTS MV Decel Time: 271 msec     Systemic  VTI:  0.25 m MV E velocity: 105.00 cm/s  Systemic Diam: 2.00 cm MV A velocity: 96.10 cm/s MV E/A ratio:  1.09  Zoila Shutter MD Electronically signed by Zoila Shutter MD Signature Date/Time: 04/02/2021/4:01:50 PM    Final              ASSESSMENT AND PLAN:   Primary hypertension - Currently on amlodipine 5 mg as well as losartan 100 mg.  Beta-blocker previously stopped. - We will go ahead and start HCTZ 25 mg once a day.  Often adding a diuretic in combination with the amlodipine and angiotensin receptor blocker can be quite helpful in the setting.  Diabetes with hypertension - As above.  Last hemoglobin A1c 6.3.  Excellent.  On metformin as well.  Continue with daily exercise.  She and her husband, also with diabetes, walk.  Excellent.  Cardiac prevention - With family history, we will go ahead and check a coronary calcium score to establish whether or not there is coronary plaque present.  She is currently taking Crestor 20 mg which is excellent.  If plaque is present, depending on volume of plaque, further stress evaluation may be warranted.  Chest discomfort symptoms may be GERD related.  May need further testing in the future.  Mitral annular calcification with trivial mitral regurgitation - Should be of no clinical consequence.  Hyperlipidemia - Continue with Crestor 20 mg.  Last LDL 76.  If calcified plaque is present would like to see LDL less than 55.   F/U 6 months with APP  Signed: Donato Schultz, MD 05/27/2023, 5:26 PM

## 2023-05-27 ENCOUNTER — Ambulatory Visit: Payer: PPO | Attending: Cardiovascular Disease | Admitting: Cardiology

## 2023-05-27 ENCOUNTER — Encounter: Payer: Self-pay | Admitting: Cardiology

## 2023-05-27 VITALS — BP 182/86 | HR 65 | Ht 61.0 in | Wt 178.6 lb

## 2023-05-27 DIAGNOSIS — E785 Hyperlipidemia, unspecified: Secondary | ICD-10-CM

## 2023-05-27 DIAGNOSIS — I700 Atherosclerosis of aorta: Secondary | ICD-10-CM

## 2023-05-27 DIAGNOSIS — Z7984 Long term (current) use of oral hypoglycemic drugs: Secondary | ICD-10-CM

## 2023-05-27 DIAGNOSIS — I10 Essential (primary) hypertension: Secondary | ICD-10-CM

## 2023-05-27 DIAGNOSIS — E1169 Type 2 diabetes mellitus with other specified complication: Secondary | ICD-10-CM

## 2023-05-27 DIAGNOSIS — I3481 Nonrheumatic mitral (valve) annulus calcification: Secondary | ICD-10-CM | POA: Diagnosis not present

## 2023-05-27 MED ORDER — HYDROCHLOROTHIAZIDE 25 MG PO TABS: 25.0000 mg | ORAL_TABLET | Freq: Every day | ORAL | 3 refills | Status: DC

## 2023-05-27 NOTE — Patient Instructions (Signed)
Medication Instructions:  Please start hydrochlorothiazide 25 mg daily. Continue all other medications as listed.  *If you need a refill on your cardiac medications before your next appointment, please call your pharmacy*  Testing/Procedures: Your physician has requested that you have a Coronary Calcium score which is completed by CT. Cardiac computed tomography (CT) is a painless test that uses an x-ray machine to take clear, detailed pictures of your heart. There are no instructions for this testing.  You may eat/drink and take your normal medications this day.  The cost of the testing is $99 due at the time of your appointment.  Follow-Up: At [facility], you and your health needs are our priority.  As part of our continuing mission to provide you with exceptional heart care, we have created designated Provider Care Teams.  These Care Teams include your primary Cardiologist (physician) and Advanced Practice Providers (APPs -  Physician Assistants and Nurse Practitioners) who all work together to provide you with the care you need, when you need it.  We recommend signing up for the patient portal called "MyChart".  Sign up information is provided on this After Visit Summary.  MyChart is used to connect with patients for Virtual Visits (Telemedicine).  Patients are able to view lab/test results, encounter notes, upcoming appointments, etc.  Non-urgent messages can be sent to your provider as well.   To learn more about what you can do with MyChart, go to [email or web address].    Your next appointment:   6 month(s)  Provider:   Jari Favre, PA-C, Robin Searing, NP, Jacolyn Reedy, PA-C, Eligha Bridegroom, NP, Tereso Newcomer, PA-C, or Perlie Gold, PA-C

## 2023-06-08 ENCOUNTER — Ambulatory Visit (HOSPITAL_COMMUNITY)
Admission: RE | Admit: 2023-06-08 | Discharge: 2023-06-08 | Disposition: A | Discharge status: Home / Self Care | Payer: PPO | Source: Ambulatory Visit | Attending: Cardiology | Admitting: Cardiology

## 2023-06-08 DIAGNOSIS — I700 Atherosclerosis of aorta: Secondary | ICD-10-CM | POA: Insufficient documentation

## 2023-06-08 DIAGNOSIS — I10 Essential (primary) hypertension: Secondary | ICD-10-CM | POA: Insufficient documentation

## 2023-07-06 ENCOUNTER — Encounter (HOSPITAL_BASED_OUTPATIENT_CLINIC_OR_DEPARTMENT_OTHER): Payer: Self-pay | Admitting: *Deleted

## 2023-07-10 LAB — HM DIABETES EYE EXAM

## 2023-07-12 ENCOUNTER — Encounter (HOSPITAL_BASED_OUTPATIENT_CLINIC_OR_DEPARTMENT_OTHER): Payer: Self-pay

## 2023-07-12 ENCOUNTER — Emergency Department (HOSPITAL_BASED_OUTPATIENT_CLINIC_OR_DEPARTMENT_OTHER): Payer: PPO

## 2023-07-12 ENCOUNTER — Other Ambulatory Visit: Payer: Self-pay

## 2023-07-12 ENCOUNTER — Emergency Department (HOSPITAL_BASED_OUTPATIENT_CLINIC_OR_DEPARTMENT_OTHER)
Admission: EM | Admit: 2023-07-12 | Discharge: 2023-07-12 | Disposition: A | Discharge status: Home / Self Care | Payer: PPO | Attending: Emergency Medicine | Admitting: Emergency Medicine

## 2023-07-12 DIAGNOSIS — R109 Unspecified abdominal pain: Secondary | ICD-10-CM | POA: Diagnosis present

## 2023-07-12 DIAGNOSIS — R748 Abnormal levels of other serum enzymes: Secondary | ICD-10-CM | POA: Insufficient documentation

## 2023-07-12 DIAGNOSIS — E119 Type 2 diabetes mellitus without complications: Secondary | ICD-10-CM | POA: Insufficient documentation

## 2023-07-12 DIAGNOSIS — M546 Pain in thoracic spine: Secondary | ICD-10-CM | POA: Insufficient documentation

## 2023-07-12 DIAGNOSIS — I10 Essential (primary) hypertension: Secondary | ICD-10-CM | POA: Insufficient documentation

## 2023-07-12 DIAGNOSIS — Z79899 Other long term (current) drug therapy: Secondary | ICD-10-CM | POA: Diagnosis not present

## 2023-07-12 DIAGNOSIS — R7989 Other specified abnormal findings of blood chemistry: Secondary | ICD-10-CM

## 2023-07-12 DIAGNOSIS — Z91010 Allergy to peanuts: Secondary | ICD-10-CM | POA: Diagnosis not present

## 2023-07-12 LAB — LIPASE, BLOOD: Lipase: 21 U/L (ref 11–51)

## 2023-07-12 LAB — CBC
HCT: 37.4 % (ref 36.0–46.0)
Hemoglobin: 12.5 g/dL (ref 12.0–15.0)
MCH: 28.6 pg (ref 26.0–34.0)
MCHC: 33.4 g/dL (ref 30.0–36.0)
MCV: 85.6 fL (ref 80.0–100.0)
Platelets: 235 10*3/uL (ref 150–400)
RBC: 4.37 MIL/uL (ref 3.87–5.11)
RDW: 12.7 % (ref 11.5–15.5)
WBC: 8.2 10*3/uL (ref 4.0–10.5)
nRBC: 0 % (ref 0.0–0.2)

## 2023-07-12 LAB — BASIC METABOLIC PANEL
Anion gap: 9 (ref 5–15)
BUN: 31 mg/dL — ABNORMAL HIGH (ref 8–23)
CO2: 27 mmol/L (ref 22–32)
Calcium: 9.6 mg/dL (ref 8.9–10.3)
Chloride: 99 mmol/L (ref 98–111)
Creatinine, Ser: 1.68 mg/dL — ABNORMAL HIGH (ref 0.44–1.00)
GFR, Estimated: 32 mL/min — ABNORMAL LOW (ref >60)
Glucose, Bld: 97 mg/dL (ref 70–99)
Potassium: 3.5 mmol/L (ref 3.5–5.1)
Sodium: 135 mmol/L (ref 135–145)

## 2023-07-12 LAB — URINALYSIS, ROUTINE W REFLEX MICROSCOPIC
Bacteria, UA: NONE SEEN
Bilirubin Urine: NEGATIVE
Glucose, UA: NEGATIVE mg/dL
Hgb urine dipstick: NEGATIVE
Ketones, ur: NEGATIVE mg/dL
Nitrite: NEGATIVE
Specific Gravity, Urine: 1.018 (ref 1.005–1.030)
pH: 5.5 (ref 5.0–8.0)

## 2023-07-12 MED ORDER — LIDOCAINE 5 % EX PTCH
1.0000 | MEDICATED_PATCH | CUTANEOUS | Status: DC
  Administered 2023-07-12: 1 via TRANSDERMAL
  Filled 2023-07-12: qty 1

## 2023-07-12 MED ORDER — ACETAMINOPHEN 325 MG PO TABS
650.0000 mg | ORAL_TABLET | Freq: Once | ORAL | Status: AC
  Administered 2023-07-12: 650 mg via ORAL
  Filled 2023-07-12: qty 2

## 2023-07-12 MED ORDER — ONDANSETRON 4 MG PO TBDP
4.0000 mg | ORAL_TABLET | Freq: Once | ORAL | Status: AC
  Administered 2023-07-12: 4 mg via ORAL
  Filled 2023-07-12: qty 1

## 2023-07-12 MED ORDER — LIDOCAINE 5 % EX PTCH: 1.0000 | MEDICATED_PATCH | CUTANEOUS | 0 refills | Status: DC

## 2023-07-12 NOTE — Discharge Instructions (Signed)
Please follow-up with your primary care provider in regards to recent symptoms and ER visit to have your labs redrawn to recheck kidney function.  Today your labs are reassuring however your kidney function was slightly elevated and needs to be rechecked.  Your CT was also reassuring.  This is most likely a muscle strain that you have and so you may take Tylenol every 6 hours and use the lidocaine patches I have prescribed.  Please avoid ibuprofen or other NSAIDs at this time.  If symptoms change or worsen please return to ER.

## 2023-07-12 NOTE — ED Triage Notes (Signed)
Pt presents with L flank pain x 1 week. Pt reports sharp pain with urination. Pt has had some associated nausea.

## 2023-07-12 NOTE — ED Provider Notes (Signed)
[name] [facility] DEPARTMENT AT [facility] Provider Note   CSN: [id number] Arrival date & time: 07/12/23  1232     History  Chief Complaint  Patient presents with   Flank Pain    Mary Collier is a 72 y.o. female history of diabetes, aortic atherosclerosis, chronic left flank pain, hyperlipidemia, hypertension presented with left flank pain for the past week.  Patient tried Tylenol to no relief.  Patient is a move makes the pain worse.  Patient states that the pain radiates from her left flank to her groin but denies history of stones.  Patient denies dysuria, hematuria, chest pain, shortness of breath, fevers, abdominal pain.  Patient does endorse nausea however denies any emesis.  Home Medications Prior to Admission medications   Medication Sig Start Date End Date Taking? Authorizing Provider  lidocaine (LIDODERM) 5 % Place 1 patch onto the skin daily. Remove & Discard patch within 12 hours or as directed by MD 07/12/23  Yes Advik Weatherspoon, Beverly Gust, PA-C  acetaminophen (TYLENOL) 325 MG tablet Take 325 mg by mouth as needed.    [provider]  amLODipine (NORVASC) 5 MG tablet Take 1 tablet (5 mg total) by mouth at bedtime. 03/26/23   Nche, Bonna Gains, NP  calcium-vitamin D (OSCAL 500/200 D-3) 500-200 MG-UNIT per tablet Take 1 tablet by mouth 2 (two) times daily. 07/12/14   Oneal Grout, MD  [employer] SENNA PLUS 8.6-50 MG tablet TAKE 1 TABLET BY MOUTH DAILY. 01/04/16   Sharon Seller, NP  diclofenac sodium (VOLTAREN) 1 % GEL APPLY 2 G TOPICALLY 3 (THREE) TIMES DAILY AS NEEDED. 06/04/19   Nche, Bonna Gains, NP  dicyclomine (BENTYL) 20 MG tablet TAKE 1 TABLET (20 MG TOTAL) BY MOUTH 4 (FOUR) TIMES DAILY - BEFORE MEALS AND AT BEDTIME. Patient taking differently: Take 20 mg by mouth as needed. 04/07/22 02/19/23  Meryl Dare, MD  dimenhyDRINATE (DRAMAMINE) 50 MG tablet Take 50 mg by mouth every 8 (eight) hours as needed for dizziness.    Emergency, Nurse, RN  fluticasone  (FLONASE) 50 MCG/ACT nasal spray SPRAY 2 SPRAYS INTO EACH NOSTRIL EVERY DAY 06/20/19   Nche, Bonna Gains, NP  folic acid (FOLVITE) 1 MG tablet Take 1 mg by mouth daily.    [provider]  hydrochlorothiazide (HYDRODIURIL) 25 MG tablet Take 1 tablet (25 mg total) by mouth daily. 05/27/23   Jake Bathe, MD  hydrocortisone (ANUSOL-HC) 25 MG suppository USE ONE SUPPOSITORY TWICE A DAY FOR 10 DAYS THEN AS NEEDED 07/22/22   Meryl Dare, MD  ibuprofen (ADVIL) 800 MG tablet Take 1 tablet (800 mg total) by mouth every 8 (eight) hours as needed. 08/27/22   Elson Areas, PA-C  ipratropium (ATROVENT) 0.03 % nasal spray     [provider]  losartan (COZAAR) 100 MG tablet Take 1 tablet (100 mg total) by mouth daily. 02/19/23   Nche, Bonna Gains, NP  LUMIGAN 0.01 % SOLN 1 drop at bedtime. 10/31/19   [provider]  metFORMIN (GLUCOPHAGE) 500 MG tablet Take 0.5 tablets (250 mg total) by mouth daily with breakfast. 03/26/23   Nche, Bonna Gains, NP  nortriptyline (PAMELOR) 25 MG capsule Take 1 capsule (25 mg total) by mouth at bedtime. 02/19/23   Everlena Cooper, Adam R, DO  omeprazole (PRILOSEC) 40 MG capsule TAKE 1 CAPSULE BY MOUTH EVERY DAY 01/06/22   Nche, Bonna Gains, NP  Phenylephrine HCl (PREPARATION H) 0.25 % SUPP Place rectally daily as needed.  [provider]  RESTASIS 0.05 % ophthalmic emulsion 1 drop 2 (two) times daily. 01/16/20   [provider]  rosuvastatin (CRESTOR) 20 MG tablet Take 1 tablet (20 mg total) by mouth daily. 03/26/23   Nche, Bonna Gains, NP  Travoprost, BAK Free, (TRAVATAN) 0.004 % SOLN ophthalmic solution 1 drop at bedtime.  12/27/19   [provider]      Allergies    Influenza vaccines, Shellfish allergy, Tree extract, Other, Celecoxib, Codeine, Compazine [prochlorperazine], Gabapentin, Iodine, Lisinopril, Peanut-containing drug products, Prednisone, Prochlorperazine edisylate, Robinul [glycopyrrolate], Shrimp extract, Vioxx  [rofecoxib], Aspirin, Egg-derived products, Epinephrine, Erythromycin, Sulfa antibiotics, and Tetracycline    Review of Systems   Review of Systems  Genitourinary:  Positive for flank pain.    Physical Exam Updated Vital Signs BP (!) 148/82   Pulse 66   Temp 98.3 F (36.8 C) (Oral)   Resp 18   Ht 5\' 1"  (1.549 m)   Wt 77.1 kg   SpO2 99%   BMI 32.12 kg/m  Physical Exam Vitals reviewed.  Constitutional:      General: She is not in acute distress. HENT:     Head: Normocephalic and atraumatic.  Eyes:     Extraocular Movements: Extraocular movements intact.     Conjunctiva/sclera: Conjunctivae normal.     Pupils: Pupils are equal, round, and reactive to light.  Cardiovascular:     Rate and Rhythm: Normal rate and regular rhythm.     Pulses: Normal pulses.     Heart sounds: Normal heart sounds.     Comments: 2+ bilateral radial/dorsalis pedis pulses with regular rate Pulmonary:     Effort: Pulmonary effort is normal. No respiratory distress.     Breath sounds: Normal breath sounds.  Abdominal:     Palpations: Abdomen is soft.     Tenderness: There is no abdominal tenderness. There is no right CVA tenderness, left CVA tenderness, guarding or rebound.  Musculoskeletal:        General: Normal range of motion.     Cervical back: Normal range of motion and neck supple.     Right lower leg: No edema.     Left lower leg: No edema.     Comments: 5 out of 5 bilateral grip/leg extension strength Tender to palpation in the left mid thoracic paraspinal muscles without abnormalities palpated No midline tenderness or abnormalities  Skin:    General: Skin is warm and dry.     Capillary Refill: Capillary refill takes less than 2 seconds.  Neurological:     General: No focal deficit present.     Mental Status: She is alert and oriented to person, place, and time.     Comments: Sensation intact in all 4 limbs  Psychiatric:        Mood and Affect: Mood normal.     ED Results /  Procedures / Treatments   Labs (all labs ordered are listed, but only abnormal results are displayed) Labs Reviewed  URINALYSIS, ROUTINE W REFLEX MICROSCOPIC - Abnormal; Notable for the following components:      Result Value   Protein, ur TRACE (*)    Leukocytes,Ua TRACE (*)    All other components within normal limits  BASIC METABOLIC PANEL - Abnormal; Notable for the following components:   BUN 31 (*)    Creatinine, Ser 1.68 (*)    GFR, Estimated 32 (*)    All other components within normal limits  CBC  LIPASE, BLOOD    EKG None  Radiology CT Renal Stone Study  Result Date: 07/12/2023 CLINICAL DATA:  Abdominal/flank pain, stone suspected. Left flank pain for 1 week. EXAM: CT ABDOMEN AND PELVIS WITHOUT CONTRAST TECHNIQUE: Multidetector CT imaging of the abdomen and pelvis was performed following the standard protocol without IV contrast. RADIATION DOSE REDUCTION: This exam was performed according to the departmental dose-optimization program which includes automated exposure control, adjustment of the mA and/or kV according to patient size and/or use of iterative reconstruction technique. COMPARISON:  CT scan abdomen and pelvis from 02/07/2021. FINDINGS: Lower chest: There are stable subpleural atelectatic changes/scarring in the right lung base. There is a stable faint linear calcification as well in the right lung base. No overt consolidation. No pleural effusion. The heart is normal in size. No pericardial effusion. Mitral annulus calcifications noted. Hepatobiliary: The liver is normal in size. Non-cirrhotic configuration. No suspicious mass. There is an approximately 1 cm sized hypoattenuating lesion in the caudate lobe, which is too small to adequately characterize but appears unchanged since the prior study. No intrahepatic or extrahepatic bile duct dilation. No calcified gallstones. Normal gallbladder wall thickness. No pericholecystic inflammatory changes. Pancreas: Unremarkable.  No pancreatic ductal dilatation or surrounding inflammatory changes. Spleen: Within normal limits. No focal lesion. Adrenals/Urinary Tract: Adrenal glands are unremarkable. No suspicious renal mass. There is a partially exophytic hypoattenuating structure arising from the left kidney lower pole, incompletely characterized on the current examination but present since the prior study and favored to represent cysts. No hydronephrosis. There are at least 2, punctate nonobstructing calculi in the left kidney upper pole. No other renal or ureteric calculi. Unremarkable urinary bladder. Stomach/Bowel: No disproportionate dilation of the small or large bowel loops. No evidence of abnormal bowel wall thickening or inflammatory changes. A very short appendix stump noted without surrounding inflammation. There are multiple diverticula mainly in the sigmoid colon, without imaging signs of diverticulitis. Vascular/Lymphatic: No ascites or pneumoperitoneum. No abdominal or pelvic lymphadenopathy, by size criteria. No aneurysmal dilation of the major abdominal arteries. There are mild peripheral atherosclerotic vascular calcifications of the aorta and its major branches. Reproductive: The uterus is surgically absent. No large adnexal mass. Other: There is a tiny fat containing umbilical hernia. The soft tissues and abdominal wall are otherwise unremarkable. Musculoskeletal: No suspicious osseous lesions. There are mild multilevel degenerative changes in the visualized spine. IMPRESSION: 1. No acute abnormality in the abdomen or pelvis. 2. Punctate nonobstructing calculi in the left kidney upper pole. No other nephroureterolithiasis or obstructive uropathy. 3. Diverticulosis without imaging signs of diverticulitis. 4. Multiple other nonacute observations, as described above. Electronically Signed   By: Jules Schick M.D.   On: 07/12/2023 14:26    Procedures Procedures    Medications Ordered in ED Medications  lidocaine  (LIDODERM) 5 % 1 patch (has no administration in time range)  acetaminophen (TYLENOL) tablet 650 mg (650 mg Oral Given 07/12/23 1443)  ondansetron (ZOFRAN-ODT) disintegrating tablet 4 mg (4 mg Oral Given 07/12/23 1444)    ED Course/ Medical Decision Making/ A&P                                 Medical Decision Making Amount and/or Complexity of Data Reviewed Labs: ordered. Radiology: ordered.  Risk OTC drugs. Prescription drug management.   Ladoris Gene 71 y.o. presented today for flank pain. Working DDx that I considered at this time includes, but not limited to, MSK, nephrolithiasis, pyelonephritis, AAA, aortic dissection, mesenteric  ischemia, RCC, obstructive uropathy, renal infarct/hemorrhage, tumor, biliary colic, pancreatitis, appendicitis, SBO, diverticulitis, shingles, lower lobe pneumonia, ectopic pregnancy, PID/TOA, ovarian torsion, endometriosis.  R/o DDx:  nephrolithiasis, pyelonephritis, AAA, aortic dissection, mesenteric ischemia, RCC, obstructive uropathy, renal infarct/hemorrhage, tumor, biliary colic, pancreatitis, appendicitis, SBO, diverticulitis, shingles, lower lobe pneumonia, ectopic pregnancy, PID/TOA, ovarian torsion, endometriosis: These are considered less likely due to history of present illness, physical exam, lab/imaging findings  Review of prior external notes: 05/27/23 Office Visit  Unique Tests and My Interpretation:  CBC: Unremarkable CMP: Creatinine 1.68 which is up from previous reading of 0.91, GFR 32 Lipase:Unremarkable [id number] CT Renal stone study: No acute pathology  Discussion with Independent Historian: None  Discussion of Management of Tests: None  Risk: Medium: prescription drug management  Risk Stratification Score: None  Staffed with Schlossman, MD  Plan: On exam patient was in no acute distress with stable vitals.  Patient's labs from triage are reassuring.  Patient did have tenderness to the left paraspinal muscles at the  thoracic region on exam which is suspicious of MSK however patient does state that the pain radiates down to her groin which could also be a kidney stone and so we will obtain CT.  Patient does have history of chronic left flank pain and so this is another consideration at this point until we get the CT scan.  Patient given Tylenol and Zofran to help with her symptoms.  CT was negative for any acute pathology however there is a nonobstructing stone in the left upper pole that I do not believe is causing patient's symptoms at this time as there is no hydronephrosis or obstruction.  Given that patient's pain can be reproducible with palpating the musculature in her back do not suspect dissection at this time causing patient's symptoms.  Patient's creatinine is slightly elevated from previous reading however it has been 11 months since the previous and so we will discharge the patient and encouraged her to follow-up with her primary care provider to have her kidney function rechecked in a week.  Patient states that she is able to drink without issue and I encouraged her to drink plenty of fluids in the meantime.  I encouraged patient use Tylenol every 6 hours and will give lidocaine patches.  I encouraged patient to avoid ibuprofen at this time for her pain.  Patient was given return precautions. Patient stable for discharge at this time.  Patient verbalized understanding of plan.         Final Clinical Impression(s) / ED Diagnoses Final diagnoses:  Acute left-sided thoracic back pain  Elevated serum creatinine    Rx / DC Orders ED Discharge Orders          Ordered    lidocaine (LIDODERM) 5 %  Every 24 hours        07/12/23 1500              Remi Deter 07/12/23 1506    Alvira Monday, MD 07/14/23 1319

## 2023-07-12 NOTE — ED Notes (Signed)
Standby assist pt to ambulate to restroom. Pt able to ambulate well. Pt back in bed w/call light and warm blanket- no other needs at this time.

## 2023-07-16 ENCOUNTER — Other Ambulatory Visit: Payer: Self-pay | Admitting: Nurse Practitioner

## 2023-07-16 DIAGNOSIS — I10 Essential (primary) hypertension: Secondary | ICD-10-CM

## 2023-07-16 NOTE — Telephone Encounter (Signed)
Requesting: AMLODIPINE BESYLATE 5 MG TAB  Last Visit: 03/24/2023 Next Visit: 08/31/2023 Last Refill: 03/26/2023  Please Advise

## 2023-07-20 ENCOUNTER — Other Ambulatory Visit: Payer: Self-pay | Admitting: Gastroenterology

## 2023-07-23 ENCOUNTER — Encounter: Payer: Self-pay | Admitting: Nurse Practitioner

## 2023-07-23 ENCOUNTER — Other Ambulatory Visit: Payer: Self-pay | Admitting: Gastroenterology

## 2023-07-27 ENCOUNTER — Ambulatory Visit: Payer: PPO | Admitting: Podiatry

## 2023-07-27 ENCOUNTER — Encounter: Payer: Self-pay | Admitting: Podiatry

## 2023-07-27 DIAGNOSIS — M79674 Pain in right toe(s): Secondary | ICD-10-CM

## 2023-07-27 DIAGNOSIS — E1165 Type 2 diabetes mellitus with hyperglycemia: Secondary | ICD-10-CM | POA: Diagnosis not present

## 2023-07-27 DIAGNOSIS — L84 Corns and callosities: Secondary | ICD-10-CM

## 2023-07-27 DIAGNOSIS — E119 Type 2 diabetes mellitus without complications: Secondary | ICD-10-CM

## 2023-07-27 DIAGNOSIS — B351 Tinea unguium: Secondary | ICD-10-CM

## 2023-07-27 DIAGNOSIS — M79675 Pain in left toe(s): Secondary | ICD-10-CM

## 2023-08-01 NOTE — Progress Notes (Signed)
ANNUAL DIABETIC FOOT EXAM  Subjective: Mary Collier presents today annual diabetic foot exam.  Patient confirms h/o diabetes.  Patient denies any h/o foot wounds.  Risk factors: diabetes, HTN, hyperlipidemia.  Nche, Bonna Gains, NP is patient's PCP.  Past Medical History:  Diagnosis Date   Allergic rhinitis    Allergy    Arthritis    Asthma    Blood transfusion without reported diagnosis    1966   Cancer (HCC)    at age 72.  abdominal   Cancer of kidney (HCC)    at age 76.  Right kidney.   Chronic headache    Dizziness 03/11/2022   Edema    GERD (gastroesophageal reflux disease)    Glaucoma    Hiatal hernia    Hypertension    IBS (irritable bowel syndrome)    Internal hemorrhoid    Lumbago    Lump or mass in breast    Lung cancer (HCC)    at age 29  Right lung.   Other abnormal blood chemistry    Other and unspecified hyperlipidemia    Other convulsions    hx seizures at age 31   Peptic stricture of esophagus    Reflux esophagitis    Tubular adenoma of colon 10/20/2013   Unspecified menopausal and postmenopausal disorder    Unspecified vitamin D deficiency    Patient Active Problem List   Diagnosis Date Noted   Irritable bowel syndrome 03/24/2023   Mitral valve annular calcification 03/24/2023   Tinnitus of both ears 12/11/2022   History of shingles 09/22/2022   Left flank pain, chronic 12/10/2021   Memory difficulty 12/10/2021   Renal cyst, left 03/13/2021   Aortic atherosclerosis (HCC) 03/13/2021   Mild cardiomegaly 03/13/2021   Dysuria 05/11/2020   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/14/2020   Postmenopausal estrogen deficiency 06/15/2019   GERD (gastroesophageal reflux disease) 07/12/2014   Fibromyalgia 04/27/2013   Special screening for malignant neoplasms, colon 03/30/2013   Vitamin D deficiency 03/30/2013   Hypertensive disorder 03/30/2013   Hyperlipidemia associated with type 2 diabetes mellitus (HCC) 03/30/2013   Constipation 03/30/2013    Routine general medical examination at a health care facility 03/30/2013   DM (diabetes mellitus) (HCC) 03/30/2013   Pulmonary nodule 04/16/2011   Past Surgical History:  Procedure Laterality Date   ABDOMINAL SURGERY  at age 69    BREAST SURGERY  1970s and 1980s   Bilateral cysts removed   KIDNEY SURGERY  at age 4   Right   LUNG SURGERY  at age 73   Right   NASAL SINUS SURGERY  1980s   TONSILLECTOMY  1970   TOTAL ABDOMINAL HYSTERECTOMY  2000s   Current Outpatient Medications on File Prior to Visit  Medication Sig Dispense Refill   acetaminophen (TYLENOL) 325 MG tablet Take 325 mg by mouth as needed.     amLODipine (NORVASC) 5 MG tablet TAKE 1 TABLET BY MOUTH EVERYDAY AT BEDTIME 90 tablet 1   calcium-vitamin D (OSCAL 500/200 D-3) 500-200 MG-UNIT per tablet Take 1 tablet by mouth 2 (two) times daily. 60 tablet 12   [facility] SENNA PLUS 8.6-50 MG tablet TAKE 1 TABLET BY MOUTH DAILY. 90 tablet 1   diclofenac sodium (VOLTAREN) 1 % GEL APPLY 2 G TOPICALLY 3 (THREE) TIMES DAILY AS NEEDED. 100 g 0   dicyclomine (BENTYL) 20 MG tablet TAKE 1 TABLET (20 MG TOTAL) BY MOUTH 4 (FOUR) TIMES DAILY - BEFORE MEALS AND AT BEDTIME. 360 tablet 0   dimenhyDRINATE (  DRAMAMINE) 50 MG tablet Take 50 mg by mouth every 8 (eight) hours as needed for dizziness.     fluticasone (FLONASE) 50 MCG/ACT nasal spray SPRAY 2 SPRAYS INTO EACH NOSTRIL EVERY DAY 48 mL 0   folic acid (FOLVITE) 1 MG tablet Take 1 mg by mouth daily.     hydrochlorothiazide (HYDRODIURIL) 25 MG tablet Take 1 tablet (25 mg total) by mouth daily. 90 tablet 3   hydrocortisone (ANUSOL-HC) 25 MG suppository USE ONE SUPPOSITORY TWICE A DAY FOR 10 DAYS THEN AS NEEDED 28 suppository 1   ibuprofen (ADVIL) 800 MG tablet Take 1 tablet (800 mg total) by mouth every 8 (eight) hours as needed. 30 tablet 0   ipratropium (ATROVENT) 0.03 % nasal spray      lidocaine (LIDODERM) 5 % Place 1 patch onto the skin daily. Remove & Discard patch within 12 hours or as directed  by MD 30 patch 0   losartan (COZAAR) 100 MG tablet Take 1 tablet (100 mg total) by mouth daily. 90 tablet 0   LUMIGAN 0.01 % SOLN 1 drop at bedtime.     metFORMIN (GLUCOPHAGE) 500 MG tablet Take 0.5 tablets (250 mg total) by mouth daily with breakfast. 45 tablet 3   nortriptyline (PAMELOR) 25 MG capsule Take 1 capsule (25 mg total) by mouth at bedtime. 90 capsule 3   omeprazole (PRILOSEC) 40 MG capsule TAKE 1 CAPSULE BY MOUTH EVERY DAY 90 capsule 1   Phenylephrine HCl (PREPARATION H) 0.25 % SUPP Place rectally daily as needed.     RESTASIS 0.05 % ophthalmic emulsion 1 drop 2 (two) times daily.     rosuvastatin (CRESTOR) 20 MG tablet Take 1 tablet (20 mg total) by mouth daily. 90 tablet 3   Travoprost, BAK Free, (TRAVATAN) 0.004 % SOLN ophthalmic solution 1 drop at bedtime.      No current facility-administered medications on file prior to visit.    Allergies  Allergen Reactions   Influenza Vaccines     D/t being allergic to eggs   Shellfish Allergy Swelling    Lip, throat, tongue swelling  Other Reaction(s): Respiratory Distress   Tree Extract Anaphylaxis   Other Itching    Newspaper Dye   Celecoxib     Able to take IBU   Codeine Swelling   Compazine [Prochlorperazine]    Gabapentin Other (See Comments)    Dizziness and somnolence   Iodine     "Has shellfish allergy, would prefer not to take"   Lisinopril Cough    cough   Peanut-Containing Drug Products     Itching  Other Reaction(s): Other (See Comments)   Prednisone     Rapid heart rate   Prochlorperazine Edisylate     paralyzed   Robinul [Glycopyrrolate]     boils   Shrimp Extract Other (See Comments)    Other reaction(s): Not available   Vioxx [Rofecoxib]     States able to take IBU   Aspirin Rash    States able to take IBU   Egg-Derived Products Rash   Epinephrine Rash   Erythromycin Rash   Sulfa Antibiotics Rash    Other Reaction(s): Other (See Comments)   Tetracycline Rash   Social History    Occupational History   Occupation: Pharmacist, community (preschool)   Tobacco Use   Smoking status: Never   Smokeless tobacco: Never  Vaping Use   Vaping status: Never Used  Substance and Sexual Activity   Alcohol use: No   Drug use: No  Sexual activity: Not on file   Family History  Problem Relation Age of Onset   Allergies Sister        x3   Asthma Sister    Bone cancer Father    Lung cancer Father    Stomach cancer Father    Esophageal cancer Father    Breast cancer Mother    Prostate cancer Brother    Pancreatic cancer Brother        mets from prostate   Colon cancer Brother    Hypertension Brother    Prostate cancer Brother    Bone cancer Brother    Diabetes Brother    Heart disease Sister    Diabetes Sister    Hypertension Sister    Breast cancer Sister        mastectomy   Stroke Sister    Rectal cancer Neg Hx    Liver cancer Neg Hx    Immunization History  Administered Date(s) Administered   PFIZER(Purple Top)SARS-COV-2 Vaccination 12/16/2019, 01/07/2020, 08/08/2020   Pfizer Covid-19 Vaccine Bivalent Booster 54yrs & up 08/12/2021   Pfizer(Comirnaty)Fall Seasonal Vaccine 12 years and older 01/22/2023   Pneumococcal Conjugate-13 04/11/2014   Pneumococcal Polysaccharide-23 04/08/2017   Tdap 02/14/2020   Zoster Recombinant(Shingrix) 10/22/2022     Review of Systems: Negative except as noted in the HPI.   Objective: There were no vitals filed for this visit.  ARRIAH WADLE is a pleasant 72 y.o. female in NAD. AAO X 3.  Vascular Examination: Capillary refill time immediate b/l. Vascular status intact b/l with palpable pedal pulses. Pedal hair present b/l. No pain with calf compression b/l. Skin temperature gradient WNL b/l. No cyanosis or clubbing b/l. No ischemia or gangrene noted b/l.   Neurological Examination: Sensation grossly intact b/l with 10 gram monofilament. Vibratory sensation intact b/l.   Dermatological Examination: Pedal skin with  normal turgor, texture and tone b/l.  No open wounds. No interdigital macerations.   Toenails 2-5 b/l thick, discolored, elongated with subungual debris and pain on dorsal palpation.   Anonychia noted bilateral great toes. Nailbed(s) epithelialized.  Hyperkeratotic lesion(s) submet head 2 b/l and submet head 5 left foot.  No erythema, no edema, no drainage, no fluctuance.  Musculoskeletal Examination: Muscle strength 5/5 to all lower extremity muscle groups bilaterally. No pain, crepitus or joint limitation noted with ROM bilateral LE. No gross bony deformities bilaterally.  Radiographs: None   Last A1c:      Latest Ref Rng & Units 03/24/2023   10:55 AM 09/22/2022    8:27 AM  Hemoglobin A1C  Hemoglobin-A1c 4.6 - 6.5 % 6.3  5.9     ADA Risk Categorization: Low Risk :  Patient has all of the following: Intact protective sensation No prior foot ulcer  No severe deformity Pedal pulses present  Assessment: 1. Pain due to onychomycosis of toenails of both feet   2. Callus   3. Type 2 diabetes mellitus with hyperglycemia, without long-term current use of insulin (HCC)   4. Encounter for diabetic foot exam (HCC)     Plan: -Consent given for treatment as described below: -Examined patient. -Diabetic foot examination performed today. -Continue diabetic foot care principles: inspect feet daily, monitor glucose as recommended by PCP and/or Endocrinologist, and follow prescribed diet per PCP, Endocrinologist and/or dietician. -Continue supportive shoe gear daily. -Toenails were debrided in length and girth 2-5 bilaterally with sterile nail nippers and dremel without iatrogenic bleeding.  -Callus(es) submet head 2 b/l and submet head 5 left  foot pared utilizing sterile scalpel blade without complication or incident. Total number debrided =3. -Patient/POA to call should there be question/concern in the interim. Return in about 3 months (around 10/27/2023).  Freddie Breech, DPM

## 2023-08-17 ENCOUNTER — Telehealth: Payer: Self-pay | Admitting: *Deleted

## 2023-08-17 NOTE — Telephone Encounter (Signed)
Transition Care Management Unsuccessful Follow-up Telephone Call  Date of discharge and from where:  [facility]  07/12/2023  Attempts:  1st Attempt  Reason for unsuccessful TCM follow-up call:  Left voice message

## 2023-08-19 ENCOUNTER — Other Ambulatory Visit: Payer: Self-pay | Admitting: Nurse Practitioner

## 2023-08-19 DIAGNOSIS — I10 Essential (primary) hypertension: Secondary | ICD-10-CM

## 2023-08-24 ENCOUNTER — Telehealth: Payer: Self-pay | Admitting: Nurse Practitioner

## 2023-08-24 NOTE — Telephone Encounter (Signed)
PCP note reviewd. Contacting pt to advise to schedule with cardiology also and to bring home BP readings to appt on 08/27/23.

## 2023-08-24 NOTE — Telephone Encounter (Signed)
Spoke with pt to discuss c/o dizziness. Pt states dizziness is random and not persistent. She believes it may be caused by new medication, amLODipine (NORVASC) 5 MG tablet, as she did not start these "dizzy spells" until starting this medication. Pt was transferred to NT, but never given a disposition. Pt called the office to schedule an OV with PCP, scheduled for Thurs 08/27/23. Pt expressed frustration during the call,not understanding why she was being "transferred to so many different people" when all she wanted to do was schedule an appointment. Explained the protocol for [facility] to the pt and the reason for so many questions was to access if she was having an emergent situation that we needed to advise differently rather than scheduling to be seen a few days later and delaying evaluation/care. Pt better understood and apologized and expressed appreciation for [place] caring.

## 2023-08-24 NOTE — Telephone Encounter (Signed)
Pt stated she has been getting dizzy to the point of her feeling like she is going to pass out. This has been going on for two weeks, she feels like she is having an adverse reaction to her amLODipine (NORVASC) 5 MG tablet [[id number]] . I transferred her over to nurse triage.

## 2023-08-24 NOTE — Telephone Encounter (Signed)
Pt aware and expresses understanding to sched with cardio and bring home bp readings to OV on 08/27/23.Marland Kitchen

## 2023-08-27 ENCOUNTER — Encounter: Payer: Self-pay | Admitting: Nurse Practitioner

## 2023-08-27 ENCOUNTER — Ambulatory Visit: Payer: PPO | Admitting: Nurse Practitioner

## 2023-08-27 VITALS — BP 135/73 | HR 61 | Temp 98.2°F | Resp 18 | Wt 171.0 lb

## 2023-08-27 DIAGNOSIS — I10 Essential (primary) hypertension: Secondary | ICD-10-CM | POA: Diagnosis not present

## 2023-08-27 DIAGNOSIS — E1169 Type 2 diabetes mellitus with other specified complication: Secondary | ICD-10-CM

## 2023-08-27 DIAGNOSIS — R42 Dizziness and giddiness: Secondary | ICD-10-CM

## 2023-08-27 DIAGNOSIS — Z7984 Long term (current) use of oral hypoglycemic drugs: Secondary | ICD-10-CM | POA: Diagnosis not present

## 2023-08-27 LAB — RENAL FUNCTION PANEL
Albumin: 4.2 g/dL (ref 3.5–5.2)
BUN: 17 mg/dL (ref 6–23)
CO2: 30 meq/L (ref 19–32)
Calcium: 9.4 mg/dL (ref 8.4–10.5)
Chloride: 99 meq/L (ref 96–112)
Creatinine, Ser: 1.15 mg/dL (ref 0.40–1.20)
GFR: 47.83 mL/min — ABNORMAL LOW (ref >60.00)
Glucose, Bld: 95 mg/dL (ref 70–99)
Phosphorus: 3.7 mg/dL (ref 2.3–4.6)
Potassium: 4.2 meq/L (ref 3.5–5.1)
Sodium: 136 meq/L (ref 135–145)

## 2023-08-27 LAB — HEMOGLOBIN A1C: Hgb A1c MFr Bld: 6.7 % — ABNORMAL HIGH (ref 4.6–6.5)

## 2023-08-27 NOTE — Assessment & Plan Note (Signed)
Presents with Dizziness, intermittent, random, last about 15secs. Onset 28month ago, occurs 1-2x/week. She start hydrochlorothiazide 28month ago. BP Readings from Last 3 Encounters:  08/27/23 135/73  07/12/23 (!) 148/82  05/27/23 (!) 182/86    Negative orthostatic BP today. Maintain med doses Repeat BMP

## 2023-08-27 NOTE — Assessment & Plan Note (Signed)
Repeat hgbA1c 

## 2023-08-27 NOTE — Progress Notes (Signed)
Established Patient Visit  Patient: Mary Collier   DOB: 26-Oct-1950   72 y.o. Female  MRN: [id number] Visit Date: 08/28/2023  Subjective:    Chief Complaint  Patient presents with   OFFICE VISIT     Medication management blood pressure. PT is due for immunizations    Hypertension   HPI Hypertensive disorder Presents with Dizziness, intermittent, random, last about 15secs. Onset 17month ago, occurs 1-2x/week. She start hydrochlorothiazide 17month ago. BP Readings from Last 3 Encounters:  08/27/23 135/73  07/12/23 (!) 148/82  05/27/23 (!) 182/86    Negative orthostatic BP today. Maintain med doses Repeat BMP  DM (diabetes mellitus) (HCC) Repeat hgbA1c  Reviewed medical, surgical, and social history today  Medications: Outpatient Medications Prior to Visit  Medication Sig   acetaminophen (TYLENOL) 325 MG tablet Take 325 mg by mouth as needed.   amLODipine (NORVASC) 5 MG tablet TAKE 1 TABLET BY MOUTH EVERYDAY AT BEDTIME   amoxicillin-clavulanate (AUGMENTIN) 875-125 MG tablet Take by mouth.   calcium-vitamin D (OSCAL 500/200 D-3) 500-200 MG-UNIT per tablet Take 1 tablet by mouth 2 (two) times daily.   Ciclopirox 1 % shampoo SMARTSIG:Sparingly Topical Daily   [facility] SENNA PLUS 8.6-50 MG tablet TAKE 1 TABLET BY MOUTH DAILY.   diclofenac sodium (VOLTAREN) 1 % GEL APPLY 2 G TOPICALLY 3 (THREE) TIMES DAILY AS NEEDED.   dimenhyDRINATE (DRAMAMINE) 50 MG tablet Take 50 mg by mouth every 8 (eight) hours as needed for dizziness.   fluticasone (FLONASE) 50 MCG/ACT nasal spray SPRAY 2 SPRAYS INTO EACH NOSTRIL EVERY DAY   folic acid (FOLVITE) 1 MG tablet Take 1 mg by mouth daily.   hydrochlorothiazide (HYDRODIURIL) 25 MG tablet Take 1 tablet (25 mg total) by mouth daily.   hydrocortisone (ANUSOL-HC) 25 MG suppository USE ONE SUPPOSITORY TWICE A DAY FOR 10 DAYS THEN AS NEEDED   ibuprofen (ADVIL) 800 MG tablet Take 1 tablet (800 mg total) by mouth every 8 (eight) hours as needed.    ipratropium (ATROVENT) 0.03 % nasal spray    losartan (COZAAR) 100 MG tablet TAKE 1 TABLET BY MOUTH EVERY DAY   LUMIGAN 0.01 % SOLN 1 drop at bedtime.   metFORMIN (GLUCOPHAGE) 500 MG tablet Take 0.5 tablets (250 mg total) by mouth daily with breakfast.   nortriptyline (PAMELOR) 25 MG capsule Take 1 capsule (25 mg total) by mouth at bedtime.   omeprazole (PRILOSEC) 40 MG capsule TAKE 1 CAPSULE BY MOUTH EVERY DAY   Phenylephrine HCl (PREPARATION H) 0.25 % SUPP Place rectally daily as needed.   RESTASIS 0.05 % ophthalmic emulsion 1 drop 2 (two) times daily.   rosuvastatin (CRESTOR) 20 MG tablet Take 1 tablet (20 mg total) by mouth daily.   Travoprost, BAK Free, (TRAVATAN) 0.004 % SOLN ophthalmic solution 1 drop at bedtime.    triamcinolone cream (KENALOG) 0.1 % Apply topically 2 (two) times daily.   dicyclomine (BENTYL) 20 MG tablet TAKE 1 TABLET (20 MG TOTAL) BY MOUTH 4 (FOUR) TIMES DAILY - BEFORE MEALS AND AT BEDTIME.   lidocaine (LIDODERM) 5 % Place 1 patch onto the skin daily. Remove & Discard patch within 12 hours or as directed by MD (Patient not taking: Reported on 08/27/2023)   No facility-administered medications prior to visit.   Reviewed past medical and social history.   ROS per HPI above      Objective:  BP 135/73 (BP Location: Left Arm, Patient Position:  Sitting, Cuff Size: Normal)   Pulse 61   Temp 98.2 F (36.8 C) (Temporal)   Resp 18   Wt 171 lb (77.6 kg)   SpO2 100%   BMI 32.31 kg/m      Physical Exam HENT:     Right Ear: Tympanic membrane, ear canal and external ear normal.     Left Ear: Tympanic membrane, ear canal and external ear normal.  Eyes:     Extraocular Movements: Extraocular movements intact.     Conjunctiva/sclera: Conjunctivae normal.     Pupils: Pupils are equal, round, and reactive to light.  Cardiovascular:     Rate and Rhythm: Normal rate and regular rhythm.     Pulses: Normal pulses.     Heart sounds: Normal heart sounds.  Pulmonary:      Effort: Pulmonary effort is normal.     Breath sounds: Normal breath sounds.  Neurological:     Mental Status: She is alert and oriented to person, place, and time.     Cranial Nerves: No cranial nerve deficit.  Psychiatric:        Mood and Affect: Mood normal.        Behavior: Behavior normal.        Thought Content: Thought content normal.     Results for orders placed or performed in visit on 08/27/23  Renal Function Panel  Result Value Ref Range   Sodium 136 135 - 145 mEq/L   Potassium 4.2 3.5 - 5.1 mEq/L   Chloride 99 96 - 112 mEq/L   CO2 30 19 - 32 mEq/L   Albumin 4.2 3.5 - 5.2 g/dL   BUN 17 6 - 23 mg/dL   Creatinine, Ser 7.16 0.40 - 1.20 mg/dL   Glucose, Bld 95 70 - 99 mg/dL   Phosphorus 3.7 2.3 - 4.6 mg/dL   GFR 96.78 (L) >93.81 mL/min   Calcium 9.4 8.4 - 10.5 mg/dL  Hemoglobin O1B  Result Value Ref Range   Hgb A1c MFr Bld 6.7 (H) 4.6 - 6.5 %      Assessment & Plan:    Problem List Items Addressed This Visit     DM (diabetes mellitus) (HCC) - Primary    Repeat hgbA1c      Relevant Orders   Hemoglobin A1c (Completed)   Hypertensive disorder    Presents with Dizziness, intermittent, random, last about 15secs. Onset 46month ago, occurs 1-2x/week. She start hydrochlorothiazide 46month ago. BP Readings from Last 3 Encounters:  08/27/23 135/73  07/12/23 (!) 148/82  05/27/23 (!) 182/86    Negative orthostatic BP today. Maintain med doses Repeat BMP      Relevant Orders   Renal Function Panel (Completed)   Other Visit Diagnoses     Dizziness          Return in about 6 months (around 02/24/2024) for HTN, DM, hyperlipidemia (fasting).     Alysia Penna, NP

## 2023-08-27 NOTE — Patient Instructions (Addendum)
Go to lab Maintain adequate oral hydration

## 2023-09-03 ENCOUNTER — Telehealth: Payer: Self-pay

## 2023-09-03 NOTE — Telephone Encounter (Signed)
Will contact pt to schedule NV once egg-free flu vaccine received.

## 2023-09-04 NOTE — Telephone Encounter (Signed)
Will do!

## 2023-09-08 NOTE — Telephone Encounter (Signed)
Called to confirm that pt received a phone call stating we have the egg-free flu vaccine in the office for her.  Pt [place] aware she needs to call and schedule a NV when she is ready for administration.

## 2023-10-15 ENCOUNTER — Encounter: Payer: Self-pay | Admitting: Gastroenterology

## 2023-10-15 ENCOUNTER — Ambulatory Visit: Payer: PPO | Admitting: Gastroenterology

## 2023-10-15 VITALS — BP 110/70 | HR 82 | Ht 61.0 in | Wt 169.0 lb

## 2023-10-15 DIAGNOSIS — K580 Irritable bowel syndrome with diarrhea: Secondary | ICD-10-CM | POA: Diagnosis not present

## 2023-10-15 DIAGNOSIS — K219 Gastro-esophageal reflux disease without esophagitis: Secondary | ICD-10-CM | POA: Diagnosis not present

## 2023-10-15 MED ORDER — OMEPRAZOLE 40 MG PO CPDR: 40.0000 mg | DELAYED_RELEASE_CAPSULE | Freq: Every day | ORAL | 11 refills | Status: AC

## 2023-10-15 MED ORDER — DICYCLOMINE HCL 20 MG PO TABS: 20.0000 mg | ORAL_TABLET | Freq: Three times a day (TID) | ORAL | 11 refills | Status: DC

## 2023-10-15 NOTE — Progress Notes (Signed)
Assessment     IBS-D, associated with urgency, mild LUQ abdominal pain, nausea  GERD, well controlled  Intermittent rectal pain likely proctalgia fugax Personal history of adenomatous colon polyps History of H. pylori gastritis, treated in 2018   Recommendations    Increase dicyclomine to 20 mg po qid prn Continue Imodium 1-2 po tid prn Continue omeprazole 40 mg every day, follow antireflux measures Surveillance colonoscopy Nov 2027 REV in 1 year with Dr. Doy Hutching   HPI    This is a 72 year old female with IBS, GERD, hemorrhoids. She has fair but not great control of IBS symptoms. Typically she has 2-3 urgent loose stool/day. She continues to experience intermittent moderately severe rectal pain that lasts 10-15 minutes then resolves.  Her GERD is well controlled. She had side effects from Robinul and Levbid was not covered.    Colonoscopy No 2020 - Four 6 to 7 mm polyps in the descending colon and in the ascending colon, removed with a cold snare. Resected and retrieved.  - Mild diverticulosis in the left colon.  - Internal hemorrhoids.  - The examination was otherwise normal on direct and retroflexion views.  Path: 1 TA, 2HPs, 1 inflammatory   Labs / Imaging       Latest Ref Rng & Units 08/27/2023   11:43 AM 07/22/2022    9:08 AM 03/11/2022   10:52 AM  Hepatic Function  Total Protein 6.0 - 8.3 g/dL  7.8    Albumin 3.5 - 5.2 g/dL 4.2  4.1  4.2   AST 0 - 37 U/L  14    ALT 0 - 35 U/L  12    Alk Phosphatase 39 - 117 U/L  67    Total Bilirubin 0.2 - 1.2 mg/dL  0.3         Latest Ref Rng & Units 07/12/2023    1:13 PM 07/22/2022    9:08 AM 03/11/2022   10:52 AM  CBC  WBC 4.0 - 10.5 K/uL 8.2  6.9  7.3   Hemoglobin 12.0 - 15.0 g/dL 22.0  25.4  27.0   Hematocrit 36.0 - 46.0 % 37.4  37.9  37.8   Platelets 150 - 400 K/uL 235  198.0  204.0      CT Renal Stone Study CLINICAL DATA:  Abdominal/flank pain, stone suspected. Left flank pain for 1 week.  EXAM: CT ABDOMEN  AND PELVIS WITHOUT CONTRAST  TECHNIQUE: Multidetector CT imaging of the abdomen and pelvis was performed following the standard protocol without IV contrast.  RADIATION DOSE REDUCTION: This exam was performed according to the departmental dose-optimization program which includes automated exposure control, adjustment of the mA and/or kV according to patient size and/or use of iterative reconstruction technique.  COMPARISON:  CT scan abdomen and pelvis from 02/07/2021.  FINDINGS: Lower chest: There are stable subpleural atelectatic changes/scarring in the right lung base. There is a stable faint linear calcification as well in the right lung base. No overt consolidation. No pleural effusion. The heart is normal in size. No pericardial effusion. Mitral annulus calcifications noted.  Hepatobiliary: The liver is normal in size. Non-cirrhotic configuration. No suspicious mass. There is an approximately 1 cm sized hypoattenuating lesion in the caudate lobe, which is too small to adequately characterize but appears unchanged since the prior study. No intrahepatic or extrahepatic bile duct dilation. No calcified gallstones. Normal gallbladder wall thickness. No pericholecystic inflammatory changes.  Pancreas: Unremarkable. No pancreatic ductal dilatation or surrounding inflammatory changes.  Spleen: Within  normal limits. No focal lesion.  Adrenals/Urinary Tract: Adrenal glands are unremarkable. No suspicious renal mass. There is a partially exophytic hypoattenuating structure arising from the left kidney lower pole, incompletely characterized on the current examination but present since the prior study and favored to represent cysts. No hydronephrosis. There are at least 2, punctate nonobstructing calculi in the left kidney upper pole. No other renal or ureteric calculi. Unremarkable urinary bladder.  Stomach/Bowel: No disproportionate dilation of the small or large bowel loops. No  evidence of abnormal bowel wall thickening or inflammatory changes. A very short appendix stump noted without surrounding inflammation. There are multiple diverticula mainly in the sigmoid colon, without imaging signs of diverticulitis.  Vascular/Lymphatic: No ascites or pneumoperitoneum. No abdominal or pelvic lymphadenopathy, by size criteria. No aneurysmal dilation of the major abdominal arteries. There are mild peripheral atherosclerotic vascular calcifications of the aorta and its major branches.  Reproductive: The uterus is surgically absent. No large adnexal mass.  Other: There is a tiny fat containing umbilical hernia. The soft tissues and abdominal wall are otherwise unremarkable.  Musculoskeletal: No suspicious osseous lesions. There are mild multilevel degenerative changes in the visualized spine.  IMPRESSION: 1. No acute abnormality in the abdomen or pelvis. 2. Punctate nonobstructing calculi in the left kidney upper pole. No other nephroureterolithiasis or obstructive uropathy. 3. Diverticulosis without imaging signs of diverticulitis. 4. Multiple other nonacute observations, as described above.  Electronically Signed   By: Jules Schick M.D.   On: 07/12/2023 14:26   Current Medications, Allergies, Past Medical History, Past Surgical History, Family History and Social History were reviewed in [employer] record.   Physical Exam: General: Well developed, well nourished, no acute distress Head: Normocephalic and atraumatic Eyes: Sclerae anicteric, EOMI Ears: Normal auditory acuity Mouth: No deformities or lesions noted Lungs: Clear throughout to auscultation Heart: Regular rate and rhythm; No murmurs, rubs or bruits Abdomen: Soft, non tender and non distended. No masses, hepatosplenomegaly or hernias noted. Normal Bowel sounds Rectal: Not done Musculoskeletal: Symmetrical with no gross deformities  Pulses:  Normal pulses  noted Extremities: No edema or deformities noted Neurological: Alert oriented x 4, grossly nonfocal Psychological:  Alert and cooperative. Normal mood and affect   Mary Howser T. Russella Dar, MD 10/15/2023, 8:10 AM

## 2023-10-15 NOTE — Patient Instructions (Signed)
We have sent the following medications to your pharmacy for you to pick up at your convenience: dicyclomine four times a day and omeprazole 40 mg daily.   Follow up with Dr. Doy Hutching in one year.  The [facility] providers would like to encourage you to use [facility] to communicate with providers for non-urgent requests or questions.  Due to long hold times on the telephone, sending your provider a message by [facility] may be a faster and more efficient way to get a response.  Please allow 48 business hours for a response.  Please remember that this is for non-urgent requests.   Thank you for choosing me and [facility].  Venita Lick. Pleas Koch., MD., Clementeen Graham

## 2023-10-29 ENCOUNTER — Ambulatory Visit: Payer: PPO | Admitting: Podiatry

## 2023-10-29 ENCOUNTER — Encounter: Payer: Self-pay | Admitting: Podiatry

## 2023-10-29 VITALS — Ht 61.0 in | Wt 169.0 lb

## 2023-10-29 DIAGNOSIS — L84 Corns and callosities: Secondary | ICD-10-CM | POA: Diagnosis not present

## 2023-10-29 DIAGNOSIS — M79675 Pain in left toe(s): Secondary | ICD-10-CM | POA: Diagnosis not present

## 2023-10-29 DIAGNOSIS — B351 Tinea unguium: Secondary | ICD-10-CM

## 2023-10-29 DIAGNOSIS — E1165 Type 2 diabetes mellitus with hyperglycemia: Secondary | ICD-10-CM | POA: Diagnosis not present

## 2023-10-29 DIAGNOSIS — M79674 Pain in right toe(s): Secondary | ICD-10-CM

## 2023-10-29 NOTE — Progress Notes (Signed)
 Subjective:  Patient ID: Mary Collier, female    DOB: [birth year],  MRN: [id number]  73 y.o. female presents to clinic with  preventative diabetic foot care and callus(es) of both feet and painful thick toenails that are difficult to trim. Painful toenails interfere with ambulation. Aggravating factors include wearing enclosed shoe gear. Pain is relieved with periodic professional debridement. Painful calluses are aggravated when weightbearing with and without shoegear. Pain is relieved with periodic professional debridement.  Chief Complaint  Patient presents with   Nail Problem    Pt is here for RFC not a diabetic PCP is Dr Katheen and LOV was October.    New problem(s): None   PCP is Nche, Roselie Rockford, NP.  Allergies  Allergen Reactions   Influenza Vaccines     D/t being allergic to eggs   Shellfish Allergy Swelling    Lip, throat, tongue swelling  Other Reaction(s): Respiratory Distress   Tree Extract Anaphylaxis   Other Itching    Newspaper Dye   Celecoxib     Able to take IBU   Codeine Swelling   Compazine [Prochlorperazine]    Gabapentin Other (See Comments)    Dizziness and somnolence   Iodine     Has shellfish allergy, would prefer not to take   Lisinopril  Cough    cough   Peanut-Containing Drug Products     Itching  Other Reaction(s): Other (See Comments)  Itching  Other Reaction(s): Other (See Comments)   Prednisone     Rapid heart rate   Prochlorperazine Edisylate     paralyzed   Robinul  [Glycopyrrolate ]     boils   Shrimp Extract Other (See Comments)    Other reaction(s): Not available   Vioxx [Rofecoxib]     States able to take IBU   Aspirin Rash    States able to take IBU   Egg-Derived Products Rash   Epinephrine Rash   Erythromycin Rash   Sulfa Antibiotics Rash    Other Reaction(s): Other (See Comments)   Tetracycline Rash    Review of Systems: Negative except as noted in the HPI.   Objective:  Mary Collier is a pleasant 73 y.o.  female in NAD. AAO x 3.  Vascular Examination: Vascular status intact b/l with palpable pedal pulses. CFT immediate b/l. No edema. No pain with calf compression b/l. Skin temperature gradient WNL b/l. No cyanosis or clubbing noted b/l LE.  Neurological Examination: Sensation grossly intact b/l with 10 gram monofilament. Vibratory sensation intact b/l.   Dermatological Examination: Pedal skin with normal turgor, texture and tone b/l. Toenails 2-5 b/l thick, discolored, elongated with subungual debris and pain on dorsal palpation. No hyperkeratotic lesions noted b/l. Hyperkeratotic lesion(s) submet head 2 left foot, submet head 2 right foot, and submet head 5 left foot.  No erythema, no edema, no drainage, no fluctuance.  Musculoskeletal Examination: Muscle strength 5/5 to b/l LE. No pain, crepitus or joint limitation noted with ROM bilateral LE.  Radiographs: None  Last A1c:      Latest Ref Rng & Units 08/27/2023   11:43 AM 03/24/2023   10:55 AM  Hemoglobin A1C  Hemoglobin-A1c 4.6 - 6.5 % 6.7  6.3      Assessment:   1. Pain due to onychomycosis of toenails of both feet   2. Callus   3. Type 2 diabetes mellitus with hyperglycemia, without long-term current use of insulin (HCC)    Plan:  -Patient was evaluated today. All questions/concerns addressed on today's visit. -Continue  foot and shoe inspections daily. Monitor blood glucose per PCP/Endocrinologist's recommendations. -Mycotic toenails 1-5 bilaterally were debrided in length and girth with sterile nail nippers and dremel without incident. -Callus(es) submet head 2 left foot, submet head 2 right foot, and submet head 5 left foot pared utilizing sterile scalpel blade without complication or incident. Total number debrided =3. -Patient/POA to call should there be question/concern in the interim.  Return in about 3 months (around 01/27/2024).  Delon LITTIE Merlin, DPM      [facility] LOCATION: [address], [place] [address]                   Office [phone]   [facility] LOCATION: [address] [place], [place] [address] Office [phone]

## 2023-11-12 ENCOUNTER — Encounter: Payer: Self-pay | Admitting: Neurology

## 2023-11-18 ENCOUNTER — Other Ambulatory Visit: Payer: Self-pay | Admitting: Nurse Practitioner

## 2023-11-18 DIAGNOSIS — I10 Essential (primary) hypertension: Secondary | ICD-10-CM

## 2023-12-11 ENCOUNTER — Other Ambulatory Visit: Payer: Self-pay | Admitting: Neurology

## 2023-12-23 ENCOUNTER — Telehealth: Payer: Self-pay

## 2023-12-23 NOTE — Telephone Encounter (Signed)
 Copied from CRM [id number]. Topic: Clinical - Prescription Issue >> Dec 23, 2023  2:17 PM Adele Barthel wrote: Reason for CRM:   Patient is requesting refill of amLODipine (NORVASC) 5 MG tablet. Reviewed chart and advised she has 1 remaining refill. Verified correct pharmacy and patient reports speaking with them and they are showing 0 refills for the med. Requested call back  CB# [phone]

## 2023-12-23 NOTE — Telephone Encounter (Signed)
 Spoke to [facility] and [facility] tech regarding RX amlodipine 5 mg refill. Pt has 1 refill with 90 day supply. Rx was refilled and is ready for pick up; pt was also scheduled for nurse visit to receive egg free vaccinations. Pt verbalized understanding.

## 2023-12-24 ENCOUNTER — Ambulatory Visit (INDEPENDENT_AMBULATORY_CARE_PROVIDER_SITE_OTHER)

## 2023-12-24 DIAGNOSIS — Z23 Encounter for immunization: Secondary | ICD-10-CM | POA: Diagnosis not present

## 2023-12-24 NOTE — Progress Notes (Signed)
 Per orders of [facility], injection of Egg Free Influenza vaccine given in RT deltoid by Mickle Plumb, cma.  Patient tolerated injection well.

## 2024-01-02 ENCOUNTER — Other Ambulatory Visit: Payer: Self-pay | Admitting: Neurology

## 2024-01-28 ENCOUNTER — Ambulatory Visit: Payer: PPO | Admitting: Podiatry

## 2024-01-28 ENCOUNTER — Encounter: Payer: Self-pay | Admitting: Podiatry

## 2024-01-28 DIAGNOSIS — E1165 Type 2 diabetes mellitus with hyperglycemia: Secondary | ICD-10-CM | POA: Diagnosis not present

## 2024-01-28 DIAGNOSIS — M79675 Pain in left toe(s): Secondary | ICD-10-CM

## 2024-01-28 DIAGNOSIS — L84 Corns and callosities: Secondary | ICD-10-CM | POA: Diagnosis not present

## 2024-01-28 DIAGNOSIS — B351 Tinea unguium: Secondary | ICD-10-CM | POA: Diagnosis not present

## 2024-01-28 DIAGNOSIS — M79674 Pain in right toe(s): Secondary | ICD-10-CM | POA: Diagnosis not present

## 2024-01-28 NOTE — Progress Notes (Signed)
 Subjective:  Patient ID: Mary Collier, female    DOB: 1951-06-16,  MRN: [id number]  73 y.o. female presents preventative diabetic foot care and callus(es) b/l lower extremities and painful thick toenails that are difficult to trim. Painful toenails interfere with ambulation. Aggravating factors include wearing enclosed shoe gear. Pain is relieved with periodic professional debridement. Painful calluses are aggravated when weightbearing with and without shoegear. Pain is relieved with periodic professional debridement.  Chief Complaint  Patient presents with   Diabetes    "Trim my nail and look at my calluses." Mary Parents, NP - 08/27/2023; A1c 6.7   New problem(s): None   PCP is Nche, Connye Delaine, NP.  Allergies  Allergen Reactions   Influenza Vaccines     D/t being allergic to eggs   Shellfish Allergy Swelling    Lip, throat, tongue swelling  Other Reaction(s): Respiratory Distress   Tree Extract Anaphylaxis   Other Itching    Newspaper Dye   Celecoxib     Able to take IBU   Codeine Swelling   Compazine [Prochlorperazine]    Gabapentin Other (See Comments)    Dizziness and somnolence   Iodine     "Has shellfish allergy, would prefer not to take"   Lisinopril Cough    cough   Peanut-Containing Drug Products     Itching  Other Reaction(s): Other (See Comments)  Itching  Other Reaction(s): Other (See Comments)   Prednisone     Rapid heart rate   Prochlorperazine Edisylate     paralyzed   Robinul [Glycopyrrolate]     boils   Shrimp Extract Other (See Comments)    Other reaction(s): Not available   Vioxx [Rofecoxib]     States able to take IBU   Aspirin Rash    States able to take IBU   Egg-Derived Products Rash   Epinephrine Rash   Erythromycin Rash   Sulfa Antibiotics Rash    Other Reaction(s): Other (See Comments)   Tetracycline Rash    Review of Systems: Negative except as noted in the HPI.   Objective:  Mary Collier is a pleasant 73 y.o. female  WD, WN in NAD. AAO x 3.  Vascular Examination: Vascular status intact b/l with palpable pedal pulses. CFT immediate b/l. Pedal hair present. No edema. No pain with calf compression b/l. Skin temperature gradient WNL b/l. No varicosities noted. No cyanosis or clubbing noted.  Neurological Examination: Sensation grossly intact b/l with 10 gram monofilament. Vibratory sensation intact b/l.  Dermatological Examination: Pedal skin with normal turgor, texture and tone b/l. Toenails 2-5 b/l thick, discolored, elongated with subungual debris and pain on dorsal palpation.   Anonychia noted bilateral great toes. Nailbed(s) epithelialized.   Hyperkeratotic lesion(s) submet head 2 left foot, submet head 2 right foot, and submet head 5 left foot.  No erythema, no edema, no drainage, no fluctuance.  Musculoskeletal Examination: Muscle strength 5/5 to b/l LE.  No pain, crepitus noted b/l. No gross pedal deformities. Patient ambulates independently without assistive aids.   Radiographs: None  Last A1c:      Latest Ref Rng & Units 08/27/2023   11:43 AM 03/24/2023   10:55 AM  Hemoglobin A1C  Hemoglobin-A1c 4.6 - 6.5 % 6.7  6.3     Assessment:   1. Pain due to onychomycosis of toenails of both feet   2. Callus   3. Type 2 diabetes mellitus with hyperglycemia, without long-term current use of insulin (HCC)    Plan:  Consent given  for treatment. Patient examined. All patient's and/or POA's questions/concerns addressed on today's visit. Toenails 2-5 bilaterally debrided in length and girth without incident. Continue foot and shoe inspections daily. Monitor blood glucose per PCP/Endocrinologist's recommendations. Continue soft, supportive shoe gear daily. Report any pedal injuries to medical professional. Call office if there are any questions/concerns. -Callus(es) submet head 2 b/l and submet head 5 left foot pared utilizing sterile scalpel blade without complication or incident. Total number debrided  =3. -Patient/POA to call should there be question/concern in the interim.  Return in about 3 months (around 04/28/2024).  Mary Collier, DPM      [place] LOCATION: [address], [place] [address]                   Office [phone]   [facility] LOCATION: [address] [place], [place] [address] Office [phone]

## 2024-02-19 ENCOUNTER — Ambulatory Visit: Payer: PPO | Admitting: Neurology

## 2024-02-23 ENCOUNTER — Other Ambulatory Visit: Payer: Self-pay | Admitting: Nurse Practitioner

## 2024-02-23 DIAGNOSIS — I10 Essential (primary) hypertension: Secondary | ICD-10-CM

## 2024-02-25 ENCOUNTER — Encounter: Payer: Self-pay | Admitting: Nurse Practitioner

## 2024-02-25 ENCOUNTER — Ambulatory Visit (INDEPENDENT_AMBULATORY_CARE_PROVIDER_SITE_OTHER): Payer: PPO | Admitting: Nurse Practitioner

## 2024-02-25 VITALS — BP 122/66 | HR 64 | Temp 98.3°F | Ht 61.0 in | Wt 176.8 lb

## 2024-02-25 DIAGNOSIS — R270 Ataxia, unspecified: Secondary | ICD-10-CM | POA: Insufficient documentation

## 2024-02-25 DIAGNOSIS — R251 Tremor, unspecified: Secondary | ICD-10-CM | POA: Diagnosis not present

## 2024-02-25 DIAGNOSIS — E1169 Type 2 diabetes mellitus with other specified complication: Secondary | ICD-10-CM | POA: Diagnosis not present

## 2024-02-25 DIAGNOSIS — E782 Mixed hyperlipidemia: Secondary | ICD-10-CM | POA: Diagnosis not present

## 2024-02-25 DIAGNOSIS — Z7984 Long term (current) use of oral hypoglycemic drugs: Secondary | ICD-10-CM

## 2024-02-25 LAB — COMPREHENSIVE METABOLIC PANEL WITH GFR
ALT: 14 U/L (ref 0–35)
AST: 17 U/L (ref 0–37)
Albumin: 4.2 g/dL (ref 3.5–5.2)
Alkaline Phosphatase: 65 U/L (ref 39–117)
BUN: 21 mg/dL (ref 6–23)
CO2: 26 meq/L (ref 19–32)
Calcium: 9.5 mg/dL (ref 8.4–10.5)
Chloride: 101 meq/L (ref 96–112)
Creatinine, Ser: 1.12 mg/dL (ref 0.40–1.20)
GFR: 49.2 mL/min — ABNORMAL LOW (ref >60.00)
Glucose, Bld: 100 mg/dL — ABNORMAL HIGH (ref 70–99)
Potassium: 3.7 meq/L (ref 3.5–5.1)
Sodium: 137 meq/L (ref 135–145)
Total Bilirubin: 0.3 mg/dL (ref 0.2–1.2)
Total Protein: 8.2 g/dL (ref 6.0–8.3)

## 2024-02-25 LAB — CK: Total CK: 139 U/L (ref 7–177)

## 2024-02-25 LAB — CBC WITH DIFFERENTIAL/PLATELET
Basophils Absolute: 0 10*3/uL (ref 0.0–0.1)
Basophils Relative: 0.4 % (ref 0.0–3.0)
Eosinophils Absolute: 0.1 10*3/uL (ref 0.0–0.7)
Eosinophils Relative: 1.7 % (ref 0.0–5.0)
HCT: 37.2 % (ref 36.0–46.0)
Hemoglobin: 12.5 g/dL (ref 12.0–15.0)
Lymphocytes Relative: 21 % (ref 12.0–46.0)
Lymphs Abs: 1.7 10*3/uL (ref 0.7–4.0)
MCHC: 33.5 g/dL (ref 30.0–36.0)
MCV: 86.9 fl (ref 78.0–100.0)
Monocytes Absolute: 0.7 10*3/uL (ref 0.1–1.0)
Monocytes Relative: 9.2 % (ref 3.0–12.0)
Neutro Abs: 5.3 10*3/uL (ref 1.4–7.7)
Neutrophils Relative %: 67.7 % (ref 43.0–77.0)
Platelets: 224 10*3/uL (ref 150.0–400.0)
RBC: 4.28 Mil/uL (ref 3.87–5.11)
RDW: 14.1 % (ref 11.5–15.5)
WBC: 7.9 10*3/uL (ref 4.0–10.5)

## 2024-02-25 LAB — MICROALBUMIN / CREATININE URINE RATIO
Creatinine,U: 134.3 mg/dL
Microalb Creat Ratio: 6.8 mg/g (ref 0.0–30.0)
Microalb, Ur: 0.9 mg/dL (ref 0.0–1.9)

## 2024-02-25 LAB — HEMOGLOBIN A1C: Hgb A1c MFr Bld: 6.7 % — ABNORMAL HIGH (ref 4.6–6.5)

## 2024-02-25 LAB — B12 AND FOLATE PANEL
Folate: 12 ng/mL (ref >5.9)
Vitamin B-12: 490 pg/mL (ref 211–911)

## 2024-02-25 LAB — TSH: TSH: 2.22 u[IU]/mL (ref 0.35–5.50)

## 2024-02-25 LAB — LDL CHOLESTEROL, DIRECT: Direct LDL: 63 mg/dL

## 2024-02-25 LAB — C-REACTIVE PROTEIN: CRP: <1 mg/dL (ref 0.5–20.0)

## 2024-02-25 LAB — SEDIMENTATION RATE: Sed Rate: 42 mm/h — ABNORMAL HIGH (ref 0–30)

## 2024-02-25 NOTE — Patient Instructions (Signed)
 Go to lab Use a walker or cane at all times You will be contacted to schedule appointment with neurology and for MRI brain.

## 2024-02-25 NOTE — Assessment & Plan Note (Addendum)
 Onset 20month ago, associated with slow gait, memory difficulty (difficulty finding words and remembering directions to familiar places), head tremor and generalized muscle weakness (worse in bilateral LE) Denies any new medication or recent vaccine or acute illness. Denies any dizziness or head trauma or nightmares or change is speech/voice Denies any fall in the last 6months  Difficulty getting gon and off exam table, fatigue with walking short distance, no cogwheel rigidity noted. Ordered MRI brain, CBC, CMP, urinalysis, THYROID , CK, A1c, HIV, RPR, B12 and folate, ESR and CRP Entered referral to neurology Advised to use cane or walker at all times (states she has both at home from family member). F/up in 2weeks

## 2024-02-25 NOTE — Progress Notes (Signed)
 Established Patient Visit  Patient: Mary Collier   DOB: 01-Nov-1950   73 y.o. Female  MRN: [id number] Visit Date: 02/25/2024  Subjective:     Chief Complaint  Patient presents with   Follow-up    6 month f/u   HPI Ataxia Onset 47month ago, associated with slow gait, memory difficulty (difficulty finding words and remembering directions to familiar places), head tremor and generalized muscle weakness (worse in bilateral LE) Denies any new medication or recent vaccine or acute illness. Denies any dizziness or head trauma or nightmares or change is speech/voice Denies any fall in the last 6months  Difficulty getting gon and off exam table, fatigue with walking short distance, no cogwheel rigidity noted. Ordered MRI brain, CBC, CMP, urinalysis, THYROID , CK, A1c, HIV, RPR, B12 and folate, ESR and CRP Entered referral to neurology Advised to use cane or walker at all times (states she has both at home from family member). F/up in 2weeks    Reviewed medical, surgical, and social history today  Medications: Outpatient Medications Prior to Visit  Medication Sig   acetaminophen  (TYLENOL ) 325 MG tablet Take 325 mg by mouth as needed.   amLODipine  (NORVASC ) 5 MG tablet TAKE 1 TABLET BY MOUTH EVERYDAY AT BEDTIME   calcium -vitamin D  (OSCAL 500/200 D-3) 500-200 MG-UNIT per tablet Take 1 tablet by mouth 2 (two) times daily.   Ciclopirox 1 % shampoo SMARTSIG:Sparingly Topical Daily   [facility] SENNA PLUS 8.6-50 MG tablet TAKE 1 TABLET BY MOUTH DAILY.   diclofenac  sodium (VOLTAREN ) 1 % GEL APPLY 2 G TOPICALLY 3 (THREE) TIMES DAILY AS NEEDED.   dimenhyDRINATE (DRAMAMINE) 50 MG tablet Take 50 mg by mouth every 8 (eight) hours as needed for dizziness.   dorzolamide-timolol (COSOPT) 2-0.5 % ophthalmic solution Place 1 drop into both eyes 2 (two) times daily.   fluticasone  (FLONASE ) 50 MCG/ACT nasal spray SPRAY 2 SPRAYS INTO EACH NOSTRIL EVERY DAY   folic acid  (FOLVITE ) 1 MG tablet Take 1 mg  by mouth daily.   hydrochlorothiazide  (HYDRODIURIL ) 25 MG tablet Take 1 tablet (25 mg total) by mouth daily.   hydrocortisone  (ANUSOL -HC) 25 MG suppository USE ONE SUPPOSITORY TWICE A DAY FOR 10 DAYS THEN AS NEEDED   ibuprofen  (ADVIL ) 800 MG tablet Take 1 tablet (800 mg total) by mouth every 8 (eight) hours as needed.   losartan  (COZAAR ) 100 MG tablet TAKE 1 TABLET BY MOUTH EVERY DAY   metFORMIN  (GLUCOPHAGE ) 500 MG tablet Take 0.5 tablets (250 mg total) by mouth daily with breakfast.   omeprazole  (PRILOSEC) 40 MG capsule Take 1 capsule (40 mg total) by mouth daily.   Phenylephrine HCl (PREPARATION H) 0.25 % SUPP Place rectally daily as needed.   RESTASIS 0.05 % ophthalmic emulsion 1 drop 2 (two) times daily.   rosuvastatin  (CRESTOR ) 20 MG tablet Take 1 tablet (20 mg total) by mouth daily.   [DISCONTINUED] Travoprost, BAK Free, (TRAVATAN) 0.004 % SOLN ophthalmic solution 1 drop at bedtime.    dicyclomine  (BENTYL ) 20 MG tablet Take 1 tablet (20 mg total) by mouth 4 (four) times daily -  before meals and at bedtime.   ipratropium (ATROVENT ) 0.03 % nasal spray  (Patient not taking: Reported on 02/25/2024)   lidocaine  (LIDODERM ) 5 % Place 1 patch onto the skin daily. Remove & Discard patch within 12 hours or as directed by MD (Patient not taking: Reported on 02/25/2024)   LUMIGAN 0.01 % SOLN 1  drop at bedtime. (Patient not taking: Reported on 02/25/2024)   nortriptyline  (PAMELOR ) 25 MG capsule TAKE 1 CAPSULE BY MOUTH EVERYDAY AT BEDTIME (Patient not taking: Reported on 02/25/2024)   triamcinolone  cream (KENALOG ) 0.1 % Apply topically 2 (two) times daily. (Patient not taking: Reported on 02/25/2024)   No facility-administered medications prior to visit.   Reviewed past medical and social history.   ROS per HPI above      Objective:  BP 122/66 (BP Location: Left Arm, Patient Position: Sitting)   Pulse 64   Temp 98.3 F (36.8 C) (Temporal)   Ht 5\' 1"  (1.549 m)   Wt 176 lb 12.8 oz (80.2 kg)   SpO2 98%    BMI 33.41 kg/m      Physical Exam Eyes:     Extraocular Movements: Extraocular movements intact.     Conjunctiva/sclera: Conjunctivae normal.     Pupils: Pupils are equal, round, and reactive to light.  Neck:     Thyroid : No thyroid  mass, thyromegaly or thyroid  tenderness.  Cardiovascular:     Rate and Rhythm: Normal rate and regular rhythm.     Pulses: Normal pulses.     Heart sounds: Normal heart sounds.  Pulmonary:     Effort: Pulmonary effort is normal.     Breath sounds: Normal breath sounds.  Musculoskeletal:     Cervical back: Normal range of motion and neck supple.  Lymphadenopathy:     Cervical: No cervical adenopathy.  Neurological:     Mental Status: She is alert and oriented to person, place, and time.     Cranial Nerves: No facial asymmetry.     Sensory: Sensation is intact.     Motor: Weakness and tremor present.     Coordination: Romberg sign positive. Finger-Nose-Finger Test normal.     Gait: Gait abnormal and tandem walk abnormal.     Deep Tendon Reflexes:     Reflex Scores:      Bicep reflexes are 2+ on the right side and 2+ on the left side.      Patellar reflexes are 2+ on the right side and 2+ on the left side.    Comments: No arm swing when walking, very slow gait     No results found for any visits on 02/25/24.    Assessment & Plan:    Problem List Items Addressed This Visit     Ataxia - Primary   Onset 54month ago, associated with slow gait, memory difficulty (difficulty finding words and remembering directions to familiar places), head tremor and generalized muscle weakness (worse in bilateral LE) Denies any new medication or recent vaccine or acute illness. Denies any dizziness or head trauma or nightmares or change is speech/voice Denies any fall in the last 6months  Difficulty getting gon and off exam table, fatigue with walking short distance, no cogwheel rigidity noted. Ordered MRI brain, CBC, CMP, urinalysis, THYROID , CK, A1c, HIV,  RPR, B12 and folate, ESR and CRP Entered referral to neurology Advised to use cane or walker at all times (states she has both at home from family member). F/up in 2weeks      Relevant Orders   MR BRAIN W WO CONTRAST   TSH   CBC with Differential/Platelet   Sedimentation rate   C-reactive protein   B12 and Folate Panel   Comprehensive metabolic panel with GFR   Ambulatory referral to Neurology   RPR   CK   Urinalysis w microscopic + reflex cultur   HIV antibody (  with reflex)   DM (diabetes mellitus) (HCC)   Relevant Orders   Hemoglobin A1c   Microalbumin / creatinine urine ratio   Other Visit Diagnoses       Tremor       Relevant Orders   MR BRAIN W WO CONTRAST   TSH   CBC with Differential/Platelet   Sedimentation rate   C-reactive protein   B12 and Folate Panel   Comprehensive metabolic panel with GFR   Ambulatory referral to Neurology   RPR   CK   Urinalysis w microscopic + reflex cultur   HIV antibody (with reflex)     Mixed hyperlipidemia       Relevant Orders   Direct LDL      Return in about 2 weeks (around 03/10/2024) for Ataxia and tremor.     [name], NP

## 2024-02-26 ENCOUNTER — Ambulatory Visit
Admission: RE | Admit: 2024-02-26 | Discharge: 2024-02-26 | Disposition: A | Discharge status: Home / Self Care | Source: Ambulatory Visit | Attending: Nurse Practitioner

## 2024-02-26 DIAGNOSIS — R251 Tremor, unspecified: Secondary | ICD-10-CM

## 2024-02-26 DIAGNOSIS — R270 Ataxia, unspecified: Secondary | ICD-10-CM

## 2024-02-26 MED ORDER — GADOPICLENOL 0.5 MMOL/ML IV SOLN
7.5000 mL | Freq: Once | INTRAVENOUS | Status: AC | PRN
  Administered 2024-02-26: 7.5 mL via INTRAVENOUS

## 2024-02-27 ENCOUNTER — Encounter: Payer: Self-pay | Admitting: Nurse Practitioner

## 2024-02-27 LAB — URINALYSIS W MICROSCOPIC + REFLEX CULTURE

## 2024-02-27 LAB — RPR: RPR Ser Ql: NONREACTIVE

## 2024-02-27 LAB — HIV ANTIBODY (ROUTINE TESTING W REFLEX): HIV 1&2 Ab, 4th Generation: NONREACTIVE

## 2024-02-29 ENCOUNTER — Other Ambulatory Visit: Payer: Self-pay | Admitting: Nurse Practitioner

## 2024-02-29 DIAGNOSIS — E1165 Type 2 diabetes mellitus with hyperglycemia: Secondary | ICD-10-CM

## 2024-02-29 DIAGNOSIS — E782 Mixed hyperlipidemia: Secondary | ICD-10-CM

## 2024-02-29 DIAGNOSIS — I700 Atherosclerosis of aorta: Secondary | ICD-10-CM

## 2024-03-01 ENCOUNTER — Ambulatory Visit: Payer: Self-pay

## 2024-03-02 NOTE — Telephone Encounter (Signed)
 Medication: Rosuvastatin  (Crestor ) 20 mg  Directions: Take 1 tablet by mouth daily  Last given: 03/26/23 Number refills: 3 Last o/v: 02/25/24 Follow up: 2 week f/u Labs: 02/25/24

## 2024-03-09 ENCOUNTER — Encounter: Payer: Self-pay | Admitting: Nurse Practitioner

## 2024-03-09 ENCOUNTER — Ambulatory Visit (INDEPENDENT_AMBULATORY_CARE_PROVIDER_SITE_OTHER): Admitting: Nurse Practitioner

## 2024-03-09 VITALS — BP 132/80 | HR 58 | Temp 96.8°F | Ht 61.0 in | Wt 176.8 lb

## 2024-03-09 DIAGNOSIS — R270 Ataxia, unspecified: Secondary | ICD-10-CM

## 2024-03-09 DIAGNOSIS — R251 Tremor, unspecified: Secondary | ICD-10-CM | POA: Diagnosis not present

## 2024-03-09 NOTE — Patient Instructions (Addendum)
 Go to lab Stop dicyclomine /bentyl  and nortriptyline /pamelor  x 1-2weeks Let me know of symptoms improve. Maintain appointment with neurology

## 2024-03-09 NOTE — Progress Notes (Signed)
 Established Patient Visit  Patient: Mary Collier   DOB: 06/21/1951   73 y.o. Female  MRN: [id number] Visit Date: 03/09/2024  Subjective:     Chief Complaint  Patient presents with   Follow-up    2 week follow up for ataxia and tremors-tremors/jerking more in head and complete body with changing positions and ambulating    HPI Ataxia MRI and lab results are unremarkable. Today she reports worsening head tremor, persistent unsteady gait and difficulty with proprioception. Use of cane daily Denies any fall since last appointment. Husband drove to office today She has appointment with neurology 03/30/2024.  Advised to hold dicyclomine  and nortriptyline  x 1-2weeks. She agreed to home PT F/up prn   Reviewed medical, surgical, and social history today  Medications: Outpatient Medications Prior to Visit  Medication Sig   acetaminophen  (TYLENOL ) 325 MG tablet Take 325 mg by mouth as needed.   amLODipine  (NORVASC ) 5 MG tablet TAKE 1 TABLET BY MOUTH EVERYDAY AT BEDTIME   calcium -vitamin D  (OSCAL 500/200 D-3) 500-200 MG-UNIT per tablet Take 1 tablet by mouth 2 (two) times daily.   Ciclopirox 1 % shampoo SMARTSIG:Sparingly Topical Daily   [facility] SENNA PLUS 8.6-50 MG tablet TAKE 1 TABLET BY MOUTH DAILY.   diclofenac  sodium (VOLTAREN ) 1 % GEL APPLY 2 G TOPICALLY 3 (THREE) TIMES DAILY AS NEEDED.   dimenhyDRINATE (DRAMAMINE) 50 MG tablet Take 50 mg by mouth every 8 (eight) hours as needed for dizziness.   dorzolamide-timolol (COSOPT) 2-0.5 % ophthalmic solution Place 1 drop into both eyes 2 (two) times daily.   fluticasone  (FLONASE ) 50 MCG/ACT nasal spray SPRAY 2 SPRAYS INTO EACH NOSTRIL EVERY DAY   folic acid  (FOLVITE ) 1 MG tablet Take 1 mg by mouth daily.   hydrochlorothiazide  (HYDRODIURIL ) 25 MG tablet Take 1 tablet (25 mg total) by mouth daily.   hydrocortisone  (ANUSOL -HC) 25 MG suppository USE ONE SUPPOSITORY TWICE A DAY FOR 10 DAYS THEN AS NEEDED   ibuprofen  (ADVIL ) 800 MG  tablet Take 1 tablet (800 mg total) by mouth every 8 (eight) hours as needed.   ipratropium (ATROVENT ) 0.03 % nasal spray    lidocaine  (LIDODERM ) 5 % Place 1 patch onto the skin daily. Remove & Discard patch within 12 hours or as directed by MD   losartan  (COZAAR ) 100 MG tablet TAKE 1 TABLET BY MOUTH EVERY DAY   LUMIGAN 0.01 % SOLN 1 drop at bedtime.   metFORMIN  (GLUCOPHAGE ) 500 MG tablet Take 0.5 tablets (250 mg total) by mouth daily with breakfast.   nortriptyline  (PAMELOR ) 25 MG capsule TAKE 1 CAPSULE BY MOUTH EVERYDAY AT BEDTIME   omeprazole  (PRILOSEC) 40 MG capsule Take 1 capsule (40 mg total) by mouth daily.   Phenylephrine HCl (PREPARATION H) 0.25 % SUPP Place rectally daily as needed.   RESTASIS 0.05 % ophthalmic emulsion 1 drop 2 (two) times daily.   rosuvastatin  (CRESTOR ) 20 MG tablet TAKE 1 TABLET BY MOUTH EVERY DAY   triamcinolone  cream (KENALOG ) 0.1 % Apply topically 2 (two) times daily.   dicyclomine  (BENTYL ) 20 MG tablet Take 1 tablet (20 mg total) by mouth 4 (four) times daily -  before meals and at bedtime.   No facility-administered medications prior to visit.   Reviewed past medical and social history.   ROS per HPI above  Last CBC Lab Results  Component Value Date   WBC 7.9 02/25/2024   HGB 12.5 02/25/2024   HCT  37.2 02/25/2024   MCV 86.9 02/25/2024   MCH 28.6 07/12/2023   RDW 14.1 02/25/2024   PLT 224.0 02/25/2024   Last metabolic panel Lab Results  Component Value Date   GLUCOSE 100 (H) 02/25/2024   NA 137 02/25/2024   K 3.7 02/25/2024   CL 101 02/25/2024   CO2 26 02/25/2024   BUN 21 02/25/2024   CREATININE 1.12 02/25/2024   GFR 49.20 (L) 02/25/2024   CALCIUM  9.5 02/25/2024   PHOS 3.7 08/27/2023   PROT 8.2 02/25/2024   ALBUMIN 4.2 02/25/2024   LABGLOB 3.5 04/09/2016   AGRATIO 1.2 04/09/2016   BILITOT 0.3 02/25/2024   ALKPHOS 65 02/25/2024   AST 17 02/25/2024   ALT 14 02/25/2024   ANIONGAP 9 07/12/2023   Last lipids Lab Results  Component  Value Date   CHOL 151 03/24/2023   HDL 47.30 03/24/2023   LDLCALC 76 03/24/2023   LDLDIRECT 63.0 02/25/2024   TRIG 140.0 03/24/2023   CHOLHDL 3 03/24/2023   Last hemoglobin A1c Lab Results  Component Value Date   HGBA1C 6.7 (H) 02/25/2024   Last thyroid  functions Lab Results  Component Value Date   TSH 2.22 02/25/2024   Last vitamin D  Lab Results  Component Value Date   VD25OH 35.15 12/12/2021   Last vitamin B12 and Folate Lab Results  Component Value Date   VITAMINB12 490 02/25/2024   FOLATE 12.0 02/25/2024        Objective:  BP 132/80 (BP Location: Left Arm, Patient Position: Sitting, Cuff Size: Large)   Pulse (!) 58   Temp (!) 96.8 F (36 C) (Temporal)   Ht 5\' 1"  (1.549 m)   Wt 176 lb 12.8 oz (80.2 kg)   SpO2 98%   BMI 33.41 kg/m      Physical Exam Vitals and nursing note reviewed.  Eyes:     Extraocular Movements: Extraocular movements intact.     Pupils: Pupils are equal, round, and reactive to light.  Cardiovascular:     Rate and Rhythm: Normal rate and regular rhythm.     Pulses: Normal pulses.     Heart sounds: Normal heart sounds.  Pulmonary:     Effort: Pulmonary effort is normal.     Breath sounds: Normal breath sounds.  Neurological:     Mental Status: She is alert and oriented to person, place, and time.     Motor: No weakness.     Coordination: Coordination abnormal.     Gait: Gait abnormal.  Psychiatric:        Mood and Affect: Mood normal.        Behavior: Behavior normal.        Thought Content: Thought content normal.     No results found for any visits on 03/09/24.    Assessment & Plan:    Problem List Items Addressed This Visit     Ataxia - Primary   MRI and lab results are unremarkable. Today she reports worsening head tremor, persistent unsteady gait and difficulty with proprioception. Use of cane daily Denies any fall since last appointment. Husband drove to office today She has appointment with neurology  03/30/2024.  Advised to hold dicyclomine  and nortriptyline  x 1-2weeks. She agreed to home PT F/up prn       Relevant Orders   Urinalysis w microscopic + reflex cultur   Ambulatory referral to Home Health   Other Visit Diagnoses       Tremor       Relevant Orders  Urinalysis w microscopic + reflex cultur   Ambulatory referral to Home Health      Return if symptoms worsen or fail to improve.     [name], NP

## 2024-03-09 NOTE — Assessment & Plan Note (Signed)
 MRI and lab results are unremarkable. Today she reports worsening head tremor, persistent unsteady gait and difficulty with proprioception. Use of cane daily Denies any fall since last appointment. Husband drove to office today She has appointment with neurology 03/30/2024.  Advised to hold dicyclomine  and nortriptyline  x 1-2weeks. She agreed to home PT F/up prn

## 2024-03-11 ENCOUNTER — Ambulatory Visit: Payer: Self-pay | Admitting: Nurse Practitioner

## 2024-03-11 LAB — URINE CULTURE
MICRO NUMBER:: 16486644
SPECIMEN QUALITY:: ADEQUATE

## 2024-03-11 LAB — URINALYSIS W MICROSCOPIC + REFLEX CULTURE
Bacteria, UA: NONE SEEN /HPF
Bilirubin Urine: NEGATIVE
Glucose, UA: NEGATIVE
Hgb urine dipstick: NEGATIVE
Hyaline Cast: NONE SEEN /LPF
Ketones, ur: NEGATIVE
Nitrites, Initial: NEGATIVE
Protein, ur: NEGATIVE
RBC / HPF: NONE SEEN /HPF (ref 0–2)
Specific Gravity, Urine: 1.012 (ref 1.001–1.035)
pH: 6.5 (ref 5.0–8.0)

## 2024-03-11 LAB — CULTURE INDICATED

## 2024-03-28 ENCOUNTER — Telehealth: Payer: Self-pay | Admitting: Nurse Practitioner

## 2024-03-28 NOTE — Telephone Encounter (Signed)
 Patient dropped off document Home Health Certificate (Order ID [id number]), to be filled out by provider. Patient requested to send it back via Fax within 7-days. Document is located in providers tray at front office.Please advise at Mobile [phone] (mobile)  Home health order came by fax.  I put in the dr box

## 2024-03-29 NOTE — Progress Notes (Unsigned)
 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 3  Follow up with PCP regarding blood pressure 4   Otherwise, follow up 1 year or as needed.   Subjective:  Mary Collier is a 73 year old female hypertension and GERD who follows up for chronic paroxysmal hemicrania and right sided occipital neuralgia.   UPDATE: Current preventative medications:  Nortriptyline  25mg  at bedtime  Headaches occur once or twice a week but only last 2-3 minutes.   She has episodic vertigo since she was a teenager.  Occurs intermittently and then resolved for a while.  Lasts 30 minutes.  Occurs spontaneously but also it also positional.  No headache, visual disturbance, ear pain.  Laying down in dark helps.  She has history of ringing in her right ear but now notes a stomping sound.  She saw ENT.  Audiogram was unremarkable.  Dix-Hallpike was negative, although it did reproduce subjective symptoms.  She has been referred to [facility] for full vestibular evaluation.       HISTORY:  She has remote history of headaches and generalized tonic clonic seizures as a teenager, which subsequently resolved.   Around 2010, she developed episodes of sharp right sided retro-orbital pain, 10/10, lasting 3 to 10 minutes.  There is associated conjunctival injection and watery eye but no ptosis or nasal congestion.  There is no associated symptoms such as visual disturbance (specific to these attacks), unilateral facial numbness/weakness, nausea, vomiting, dizziness or focal unilateral weakness or numbness of the body.  They initially occurred once a day but then became several times daily in  2018.  There are no triggers.  Laying down and applying warm compresses helps.  She takes Tylenol .  She was previously treated at [facility] and was diagnosed with ocular migraines.  She also has sensation of water in back of the right occipital region.  She received occipital nerve blocks years ago, which was effective.     Around 2015, she developed gradual onset of vision loss and visual disturbance.  People and objects look like shadows and she sees double or triple.  When she reads, she sees several copies of the words stacked on top of each other.  She also sees floaters.  Occasionally, she sees a small yellow dot in the vision of her left eye that is intermittent, lasting just seconds.  She has been followed by [facility] for several years for glaucoma.  Due to persistent visual disturbance, she was evaluated by Dr. Sherial Dimes, a retinal specialist, on 10/23/16. On Humphrey visual field testing, she exhibited severe constriction bilaterally.  Retinal exam revealed grade II hypertensive retinopathy with minimal vitreous opacities.  It was suggested that her visual field defect may be migraine-related phenomena.  I last saw her in 2018.  Sed rate and CRP from 01/01/2017 were 13 and 0.1 respectively.  MRI of brain with and without contrast on 01/21/2017 was personally reviewed and was unremarkable.  She saw neuro-ophthalmologist Dr. Constantino Demark later in 2018, who was unable to make any definitive diagnosis.  She continues to have the persistent vision loss.  Labs from 10/12/2019 showed sed rate 31 and CRP <  1.0.  She saw [facility] in June 2021.   Neuro-ophthalmic exam was normal but noted to have significant corneal dryness which could explain her decreased visual acuity.  An ultrasound of the temporal arteries was performed and was negative.  Sometimes she has a twitching in her right eye and has trouble opening it.  Not necessarily associated with headache.  Around May or  June 2022, she started getting eye pain in the left eye now as well as the right eye, occurs separately, not at same time.  They last 10-15 minutes and occur 2-3 times a day.     There is no family history of headaches or cerebral aneurysms.   Past medications:  Gabapentin (dizziness), topiramate  (dizziness), verapamil  (dizziness)  PAST MEDICAL HISTORY: Past Medical History:  Diagnosis Date   Allergic rhinitis    Allergy    Arthritis    Asthma    Blood transfusion without reported diagnosis    1966   Cancer (HCC)    at age 89.  abdominal   Cancer of kidney (HCC)    at age 16.  Right kidney.   Chronic headache    Dizziness 03/11/2022   Edema    GERD (gastroesophageal reflux disease)    Glaucoma    Hiatal hernia    Hypertension    IBS (irritable bowel syndrome)    Internal hemorrhoid    Lumbago    Lump or mass in breast    Lung cancer (HCC)    at age 10  Right lung.   Other abnormal blood chemistry    Other and unspecified hyperlipidemia    Other convulsions    hx seizures at age 76   Peptic stricture of esophagus    Reflux esophagitis    Tubular adenoma of colon 10/20/2013   Unspecified menopausal and postmenopausal disorder    Unspecified vitamin D  deficiency     MEDICATIONS: Current Outpatient Medications on File Prior to Visit  Medication Sig Dispense Refill   acetaminophen  (TYLENOL ) 325 MG tablet Take 325 mg by mouth as needed.     amLODipine  (NORVASC ) 5 MG tablet TAKE 1 TABLET BY MOUTH EVERYDAY AT BEDTIME 90 tablet 1   calcium -vitamin D  (OSCAL 500/200 D-3) 500-200 MG-UNIT per tablet Take 1 tablet by mouth 2 (two) times daily. 60 tablet 12   Ciclopirox 1 % shampoo SMARTSIG:Sparingly Topical Daily     [facility] SENNA PLUS 8.6-50 MG tablet TAKE 1 TABLET BY MOUTH DAILY. 90 tablet 1   diclofenac  sodium (VOLTAREN ) 1 % GEL APPLY 2 G TOPICALLY 3 (THREE) TIMES DAILY AS NEEDED. 100 g 0   dicyclomine  (BENTYL ) 20 MG tablet Take 1 tablet (20 mg total) by mouth 4 (four) times  daily -  before meals and at bedtime. 120 tablet 11   dimenhyDRINATE (DRAMAMINE) 50 MG tablet Take 50 mg by mouth every 8 (eight) hours as needed for dizziness.     dorzolamide-timolol (COSOPT) 2-0.5 % ophthalmic solution Place 1 drop into both eyes 2 (two) times daily.     fluticasone  (FLONASE ) 50 MCG/ACT nasal spray SPRAY 2 SPRAYS INTO EACH NOSTRIL EVERY DAY 48 mL 0   folic acid  (FOLVITE ) 1 MG tablet Take 1 mg by mouth daily.     hydrochlorothiazide  (HYDRODIURIL ) 25 MG tablet Take 1 tablet (25 mg total) by mouth daily. 90 tablet 3   hydrocortisone  (ANUSOL -HC) 25 MG suppository USE ONE SUPPOSITORY TWICE A DAY FOR 10 DAYS THEN AS NEEDED 28 suppository 1   ibuprofen  (ADVIL ) 800 MG  tablet Take 1 tablet (800 mg total) by mouth every 8 (eight) hours as needed. 30 tablet 0   ipratropium (ATROVENT ) 0.03 % nasal spray      lidocaine  (LIDODERM ) 5 % Place 1 patch onto the skin daily. Remove & Discard patch within 12 hours or as directed by MD 30 patch 0   losartan  (COZAAR ) 100 MG tablet TAKE 1 TABLET BY MOUTH EVERY DAY 90 tablet 1   LUMIGAN 0.01 % SOLN 1 drop at bedtime.     metFORMIN  (GLUCOPHAGE ) 500 MG tablet Take 0.5 tablets (250 mg total) by mouth daily with breakfast. 45 tablet 3   nortriptyline  (PAMELOR ) 25 MG capsule TAKE 1 CAPSULE BY MOUTH EVERYDAY AT BEDTIME 90 capsule 0   omeprazole  (PRILOSEC) 40 MG capsule Take 1 capsule (40 mg total) by mouth daily. 30 capsule 11   Phenylephrine HCl (PREPARATION H) 0.25 % SUPP Place rectally daily as needed.     RESTASIS 0.05 % ophthalmic emulsion 1 drop 2 (two) times daily.     rosuvastatin  (CRESTOR ) 20 MG tablet TAKE 1 TABLET BY MOUTH EVERY DAY 90 tablet 1   triamcinolone  cream (KENALOG ) 0.1 % Apply topically 2 (two) times daily.     No current facility-administered medications on file prior to visit.    ALLERGIES: Allergies  Allergen Reactions   Influenza Vaccines     D/t being allergic to eggs   Shellfish Allergy Swelling    Lip, throat, tongue  swelling  Other Reaction(s): Respiratory Distress   Tree Extract Anaphylaxis   Other Itching    Newspaper Dye   Celecoxib     Able to take IBU   Codeine Swelling   Compazine [Prochlorperazine]    Gabapentin Other (See Comments)    Dizziness and somnolence   Iodine     "Has shellfish allergy, would prefer not to take"   Lisinopril  Cough    cough   Peanut-Containing Drug Products     Itching  Other Reaction(s): Other (See Comments)  Itching  Other Reaction(s): Other (See Comments)   Prednisone     Rapid heart rate   Prochlorperazine Edisylate     paralyzed   Robinul  [Glycopyrrolate ]     boils   Shrimp Extract Other (See Comments)    Other reaction(s): Not available   Vioxx [Rofecoxib]     States able to take IBU   Aspirin Rash    States able to take IBU   Egg-Derived Products Rash   Epinephrine Rash   Erythromycin Rash   Sulfa Antibiotics Rash    Other Reaction(s): Other (See Comments)   Tetracycline Rash    FAMILY HISTORY: Family History  Problem Relation Age of Onset   Allergies Sister        x3   Asthma Sister    Bone cancer Father    Lung cancer Father    Stomach cancer Father    Esophageal cancer Father    Breast cancer Mother    Prostate cancer Brother    Pancreatic cancer Brother        mets from prostate   Colon cancer Brother    Hypertension Brother    Prostate cancer Brother    Bone cancer Brother    Diabetes Brother    Heart disease Sister    Diabetes Sister    Hypertension Sister    Breast cancer Sister        mastectomy   Stroke Sister    Rectal cancer Neg Hx    Liver  cancer Neg Hx       Objective:  *** General: No acute distress.  Patient appears well-groomed.   Head:  Normocephalic/atraumatic Neck:  Supple.  No paraspinal tenderness.  Full range of motion. Heart:  Regular rate and rhythm. Neuro:  Alert and oriented.  Speech fluent and not dysarthric.  Language intact.  CN II-XII intact.  Bulk and tone normal.  Muscle strength  5/5 throughout.  Sensation to light touch intact.  Deep tendon reflexes 2+ throughout, toes downgoing.  Gait normal.  Romberg negative.    Janne Members, DO  CC: Kandace Organ, NP

## 2024-03-30 ENCOUNTER — Encounter: Payer: Self-pay | Admitting: Neurology

## 2024-03-30 ENCOUNTER — Ambulatory Visit: Payer: PPO | Admitting: Neurology

## 2024-03-30 VITALS — BP 148/70 | HR 72 | Ht 61.0 in | Wt 172.0 lb

## 2024-03-30 DIAGNOSIS — G259 Extrapyramidal and movement disorder, unspecified: Secondary | ICD-10-CM | POA: Diagnosis not present

## 2024-03-30 NOTE — Patient Instructions (Addendum)
 Based on my exam, it does not appear to be a neurologic etiology.  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  However, given your history of seizures, will check EEG.  I would still do physical therapy.

## 2024-03-31 ENCOUNTER — Ambulatory Visit: Admitting: Neurology

## 2024-03-31 DIAGNOSIS — G259 Extrapyramidal and movement disorder, unspecified: Secondary | ICD-10-CM

## 2024-03-31 NOTE — Progress Notes (Signed)
 EEG complete and ready for review.

## 2024-04-01 ENCOUNTER — Ambulatory Visit: Payer: Self-pay | Admitting: Neurology

## 2024-04-01 NOTE — Telephone Encounter (Signed)
 Mary Collier from [facility] returned my call. I informed her that our office received the home health certification forms and that Mary Parents, NP who is patient PCP is not in the office and wanted to know if it was okay for one of her colleagues to sign off on the forms. She informed me that she will need the name and title of the person willing to sign the forms so that she can have a new set of forms faxed to our office with that information. I gave her the name of Mary Cedar, NP and the needed numbers for telephone and fax. She thanked me for calling and updating their office of what is going on. I told her that I will be on the look out for the updated forms

## 2024-04-01 NOTE — Progress Notes (Signed)
 Patient advised.

## 2024-04-01 NOTE — Procedures (Signed)
 ELECTROENCEPHALOGRAM REPORT  Date of Study: 03/31/2024  Patient's Name: Mary Collier MRN: [id number] Date of Birth: 01-Dec-1950   Clinical History: 73 year old female with remote history of epilepsy resolved in her 45s presents with sudden onset abnormal movements.  Medications: Current Outpatient Medications on File Prior to Visit  Medication Sig Dispense Refill   acetaminophen  (TYLENOL ) 325 MG tablet Take 325 mg by mouth as needed.     amLODipine  (NORVASC ) 5 MG tablet TAKE 1 TABLET BY MOUTH EVERYDAY AT BEDTIME 90 tablet 1   calcium -vitamin D  (OSCAL 500/200 D-3) 500-200 MG-UNIT per tablet Take 1 tablet by mouth 2 (two) times daily. 60 tablet 12   Ciclopirox 1 % shampoo SMARTSIG:Sparingly Topical Daily     [facility] SENNA PLUS 8.6-50 MG tablet TAKE 1 TABLET BY MOUTH DAILY. 90 tablet 1   diclofenac  sodium (VOLTAREN ) 1 % GEL APPLY 2 G TOPICALLY 3 (THREE) TIMES DAILY AS NEEDED. 100 g 0   dimenhyDRINATE (DRAMAMINE) 50 MG tablet Take 50 mg by mouth every 8 (eight) hours as needed for dizziness.     dorzolamide-timolol (COSOPT) 2-0.5 % ophthalmic solution Place 1 drop into both eyes 2 (two) times daily.     fluticasone  (FLONASE ) 50 MCG/ACT nasal spray SPRAY 2 SPRAYS INTO EACH NOSTRIL EVERY DAY 48 mL 0   folic acid  (FOLVITE ) 1 MG tablet Take 1 mg by mouth daily.     hydrochlorothiazide  (HYDRODIURIL ) 25 MG tablet Take 1 tablet (25 mg total) by mouth daily. 90 tablet 3   ibuprofen  (ADVIL ) 800 MG tablet Take 1 tablet (800 mg total) by mouth every 8 (eight) hours as needed. 30 tablet 0   ipratropium (ATROVENT ) 0.03 % nasal spray  (Patient not taking: Reported on 03/30/2024)     losartan  (COZAAR ) 100 MG tablet TAKE 1 TABLET BY MOUTH EVERY DAY 90 tablet 1   metFORMIN  (GLUCOPHAGE ) 500 MG tablet Take 0.5 tablets (250 mg total) by mouth daily with breakfast. 45 tablet 3   nortriptyline  (PAMELOR ) 25 MG capsule TAKE 1 CAPSULE BY MOUTH EVERYDAY AT BEDTIME 90 capsule 0   omeprazole  (PRILOSEC) 40 MG capsule Take 1  capsule (40 mg total) by mouth daily. 30 capsule 11   Phenylephrine HCl (PREPARATION H) 0.25 % SUPP Place rectally daily as needed.     RESTASIS 0.05 % ophthalmic emulsion 1 drop 2 (two) times daily.     rosuvastatin  (CRESTOR ) 20 MG tablet TAKE 1 TABLET BY MOUTH EVERY DAY 90 tablet 1   triamcinolone  cream (KENALOG ) 0.1 % Apply topically 2 (two) times daily.     No current facility-administered medications on file prior to visit.    Technical Summary: A multichannel digital EEG recording measured by the international 10-20 system with electrodes applied with paste and impedances below 5000 ohms performed in our laboratory with EKG monitoring in an awake and drowsy patient.  Photic stimulation was performed.  The digital EEG was referentially recorded, reformatted, and digitally filtered in a variety of bipolar and referential montages for optimal display.    Description: The patient is awake and drowsy during the recording.  During maximal wakefulness, there is a symmetric, medium voltage 9-10 Hz posterior dominant rhythm that attenuates with eye opening.  The record is symmetric.  Stage 2 sleep was not seen.  Photic stimulation did not elicit any abnormalities.  Throughout the study, patient exhibited brief body jerks without electrographic correlate.  There were no epileptiform discharges or electrographic seizures seen.    EKG lead was unremarkable.  Impression:  This awake and drowsy EEG is normal.    Clinical Correlation: The above findings indicate that her body jerks are non-epileptic.     Janne Members, DO

## 2024-04-01 NOTE — Telephone Encounter (Signed)
 Called and left a voice message asking for someone to give me a call back at the office at [phone].

## 2024-04-08 NOTE — Telephone Encounter (Signed)
 Called and left a voice message requesting new form with provider Rheba Cedar, NP to be faxed to the office or this will wait until [name], NP returns to office 6/24.

## 2024-04-11 ENCOUNTER — Telehealth: Payer: Self-pay | Admitting: Nurse Practitioner

## 2024-04-11 NOTE — Telephone Encounter (Signed)
 Patient dropped off document Home Health Certificate (Order ID [id number]), to be filled out by provider. Patient requested to send it back via Fax within 7-days. Document is located in providers tray at front office.Please advise at Mobile [phone] (mobile) Home health cert. Came through by fax. I put in the dr box

## 2024-04-12 NOTE — Telephone Encounter (Signed)
 Received a fax from [facility] on 04/10/24 to Roselie Mood, NP to complete. Placed in provider's folder in her office to review and sign.

## 2024-04-12 NOTE — Telephone Encounter (Signed)
CLINICAL USE BELOW THIS LINE (use X to signify taken)  __X__Form received and placed in providers office for signature. ____Form completed and faxed to LOA Dept. ____Form completed & LVM to notify pt ready for pick up ____Charge sheet & copy of form in front office folder for office supervisor.   

## 2024-04-13 DIAGNOSIS — E119 Type 2 diabetes mellitus without complications: Secondary | ICD-10-CM | POA: Diagnosis not present

## 2024-04-13 DIAGNOSIS — G250 Essential tremor: Secondary | ICD-10-CM | POA: Diagnosis not present

## 2024-04-13 DIAGNOSIS — Z7984 Long term (current) use of oral hypoglycemic drugs: Secondary | ICD-10-CM

## 2024-04-13 DIAGNOSIS — R270 Ataxia, unspecified: Secondary | ICD-10-CM | POA: Diagnosis not present

## 2024-04-13 DIAGNOSIS — Z9181 History of falling: Secondary | ICD-10-CM

## 2024-04-13 DIAGNOSIS — I10 Essential (primary) hypertension: Secondary | ICD-10-CM | POA: Diagnosis not present

## 2024-04-13 DIAGNOSIS — Z5982 Transportation insecurity: Secondary | ICD-10-CM

## 2024-04-13 DIAGNOSIS — Z79899 Other long term (current) drug therapy: Secondary | ICD-10-CM

## 2024-05-05 ENCOUNTER — Ambulatory Visit: Admitting: Podiatry

## 2024-05-09 ENCOUNTER — Encounter: Payer: Self-pay | Admitting: Podiatry

## 2024-05-09 ENCOUNTER — Ambulatory Visit: Admitting: Podiatry

## 2024-05-09 DIAGNOSIS — B351 Tinea unguium: Secondary | ICD-10-CM

## 2024-05-09 DIAGNOSIS — E1165 Type 2 diabetes mellitus with hyperglycemia: Secondary | ICD-10-CM

## 2024-05-09 DIAGNOSIS — M79675 Pain in left toe(s): Secondary | ICD-10-CM | POA: Diagnosis not present

## 2024-05-09 DIAGNOSIS — M79674 Pain in right toe(s): Secondary | ICD-10-CM

## 2024-05-12 ENCOUNTER — Ambulatory Visit (INDEPENDENT_AMBULATORY_CARE_PROVIDER_SITE_OTHER): Admitting: Nurse Practitioner

## 2024-05-12 VITALS — BP 138/78 | HR 62 | Temp 98.4°F | Ht 61.0 in | Wt 175.6 lb

## 2024-05-12 DIAGNOSIS — E1165 Type 2 diabetes mellitus with hyperglycemia: Secondary | ICD-10-CM

## 2024-05-12 DIAGNOSIS — R270 Ataxia, unspecified: Secondary | ICD-10-CM | POA: Diagnosis not present

## 2024-05-12 DIAGNOSIS — Z7985 Long-term (current) use of injectable non-insulin antidiabetic drugs: Secondary | ICD-10-CM

## 2024-05-12 DIAGNOSIS — I10 Essential (primary) hypertension: Secondary | ICD-10-CM | POA: Diagnosis not present

## 2024-05-12 MED ORDER — HYDROCHLOROTHIAZIDE 25 MG PO TABS: 25.0000 mg | ORAL_TABLET | Freq: Every day | ORAL | 1 refills | Status: DC

## 2024-05-12 MED ORDER — METFORMIN HCL 500 MG PO TABS: 250.0000 mg | ORAL_TABLET | Freq: Every day | ORAL | 1 refills | Status: AC

## 2024-05-12 MED ORDER — AMLODIPINE BESYLATE 5 MG PO TABS: 5.0000 mg | ORAL_TABLET | Freq: Every evening | ORAL | 1 refills | Status: DC

## 2024-05-12 NOTE — Assessment & Plan Note (Signed)
 We discussed MRI brain report. No acute finding, chronic small vessel ischemic disease and age related brain atrophy. Today she reports improved mobility and fatigue, decreased tremor, but persistent mild lightheadedness. She had evaluation with neurology. Parkinson's disease ruled out per neurology. Tertiary consultation should be considered if symptoms worsen or do not improved with PT. Will continue to hold dicyclomine  and nortriptyline .

## 2024-05-12 NOTE — Patient Instructions (Signed)
 Continue to hold dicyclomine  and nortriptyline 

## 2024-05-12 NOTE — Progress Notes (Signed)
 Established Patient Visit  Patient: Mary Collier   DOB: [birth year]   73 y.o. Female  MRN: [id number] Visit Date: 05/12/2024  Subjective:    Chief Complaint  Patient presents with   Follow-up    Follow up and to discuss test    HPI Ataxia We discussed MRI brain report. No acute finding, chronic small vessel ischemic disease and age related brain atrophy. Today she reports improved mobility and fatigue, decreased tremor, but persistent mild lightheadedness. She had evaluation with neurology. Parkinson's disease ruled out per neurology. Tertiary consultation should be considered if symptoms worsen or do not improved with PT. Will continue to hold dicyclomine  and nortriptyline .  Hypertensive disorder BP at goal with amlodipine , losartan  and hydrochlorothiazide  BP Readings from Last 3 Encounters:  05/12/24 138/78  03/30/24 (!) 148/70  03/09/24 132/80    Maintain med doses  DM (diabetes mellitus) (HCC) Controlled with metformin  250mg  daily Last hgbA1c at 6.7% Normal UACr No neuropathy No retinopathy LDL at goal with atorvastatin   F/up in 4months  Reviewed medical, surgical, and social history today  Medications: Outpatient Medications Prior to Visit  Medication Sig   acetaminophen  (TYLENOL ) 325 MG tablet Take 325 mg by mouth as needed.   calcium -vitamin D  (OSCAL 500/200 D-3) 500-200 MG-UNIT per tablet Take 1 tablet by mouth 2 (two) times daily.   ciclopirox (LOPROX) 0.77 % cream Apply 1 Application topically 2 (two) times daily.   Ciclopirox 1 % shampoo SMARTSIG:Sparingly Topical Daily   [facility] SENNA PLUS 8.6-50 MG tablet TAKE 1 TABLET BY MOUTH DAILY.   diclofenac  sodium (VOLTAREN ) 1 % GEL APPLY 2 G TOPICALLY 3 (THREE) TIMES DAILY AS NEEDED.   dimenhyDRINATE (DRAMAMINE) 50 MG tablet Take 50 mg by mouth every 8 (eight) hours as needed for dizziness.   dorzolamide-timolol (COSOPT) 2-0.5 % ophthalmic solution Place 1 drop into both eyes 2 (two) times daily.    fluticasone  (FLONASE ) 50 MCG/ACT nasal spray SPRAY 2 SPRAYS INTO EACH NOSTRIL EVERY DAY   folic acid  (FOLVITE ) 1 MG tablet Take 1 mg by mouth daily.   ibuprofen  (ADVIL ) 800 MG tablet Take 1 tablet (800 mg total) by mouth every 8 (eight) hours as needed.   ipratropium (ATROVENT ) 0.03 % nasal spray    losartan  (COZAAR ) 100 MG tablet TAKE 1 TABLET BY MOUTH EVERY DAY   nortriptyline  (PAMELOR ) 25 MG capsule TAKE 1 CAPSULE BY MOUTH EVERYDAY AT BEDTIME   omeprazole  (PRILOSEC) 40 MG capsule Take 1 capsule (40 mg total) by mouth daily.   Phenylephrine HCl (PREPARATION H) 0.25 % SUPP Place rectally daily as needed.   RESTASIS 0.05 % ophthalmic emulsion 1 drop 2 (two) times daily.   rosuvastatin  (CRESTOR ) 20 MG tablet TAKE 1 TABLET BY MOUTH EVERY DAY   triamcinolone  cream (KENALOG ) 0.1 % Apply topically 2 (two) times daily.   [DISCONTINUED] amLODipine  (NORVASC ) 5 MG tablet TAKE 1 TABLET BY MOUTH EVERYDAY AT BEDTIME   [DISCONTINUED] hydrochlorothiazide  (HYDRODIURIL ) 25 MG tablet Take 1 tablet (25 mg total) by mouth daily.   [DISCONTINUED] metFORMIN  (GLUCOPHAGE ) 500 MG tablet Take 0.5 tablets (250 mg total) by mouth daily with breakfast.   No facility-administered medications prior to visit.   Reviewed past medical and social history.   ROS per HPI above      Objective:  BP 138/78 (BP Location: Left Arm, Patient Position: Sitting, Cuff Size: Normal)   Pulse 62   Temp 98.4 F (36.9 C) (  Oral)   Ht 5' 1 (1.549 m)   Wt 175 lb 9.6 oz (79.7 kg)   SpO2 99%   BMI 33.18 kg/m      Physical Exam Vitals and nursing note reviewed.  Cardiovascular:     Rate and Rhythm: Normal rate.     Pulses: Normal pulses.  Pulmonary:     Effort: Pulmonary effort is normal.  Neurological:     Mental Status: She is alert and oriented to person, place, and time.     Cranial Nerves: No dysarthria or facial asymmetry.     Motor: Weakness present. No tremor or abnormal muscle tone.     Coordination: Romberg sign  positive. Coordination normal. Finger-Nose-Finger Test and Heel to [facility] Test normal. Rapid alternating movements normal.     Gait: Gait abnormal.     Comments: Minimal ataxic gait. Use of cane     No results found for any visits on 05/12/24.    Assessment & Plan:    Problem List Items Addressed This Visit     Ataxia - Primary   We discussed MRI brain report. No acute finding, chronic small vessel ischemic disease and age related brain atrophy. Today she reports improved mobility and fatigue, decreased tremor, but persistent mild lightheadedness. She had evaluation with neurology. Parkinson's disease ruled out per neurology. Tertiary consultation should be considered if symptoms worsen or do not improved with PT. Will continue to hold dicyclomine  and nortriptyline .      DM (diabetes mellitus) (HCC)   Controlled with metformin  250mg  daily Last hgbA1c at 6.7% Normal UACr No neuropathy No retinopathy LDL at goal with atorvastatin   F/up in 4months      Relevant Medications   metFORMIN  (GLUCOPHAGE ) 500 MG tablet   Hypertensive disorder   BP at goal with amlodipine , losartan  and hydrochlorothiazide  BP Readings from Last 3 Encounters:  05/12/24 138/78  03/30/24 (!) 148/70  03/09/24 132/80    Maintain med doses      Relevant Medications   hydrochlorothiazide  (HYDRODIURIL ) 25 MG tablet   amLODipine  (NORVASC ) 5 MG tablet   Return in about 4 months (around 09/12/2024) for HTN, DM, hyperlipidemia (fasting).     Roselie Mood, NP

## 2024-05-12 NOTE — Assessment & Plan Note (Signed)
 Controlled with metformin  250mg  daily Last hgbA1c at 6.7% Normal UACr No neuropathy No retinopathy LDL at goal with atorvastatin   F/up in 4months

## 2024-05-12 NOTE — Assessment & Plan Note (Signed)
 BP at goal with amlodipine , losartan  and hydrochlorothiazide  BP Readings from Last 3 Encounters:  05/12/24 138/78  03/30/24 (!) 148/70  03/09/24 132/80    Maintain med doses

## 2024-05-14 NOTE — Progress Notes (Signed)
 Subjective:  Patient ID: Mary Collier, female    DOB: 03/27/1951,  MRN: [id number]  SUZZANE QUILTER presents to clinic today for preventative diabetic foot care and painful thick toenails that are difficult to trim. Pain interferes with ambulation. Aggravating factors include wearing enclosed shoe gear. Pain is relieved with periodic professional debridement.  She is recovering from adverse affect of one of her medications. She is currently in PT and using a cane for gait stability. States her right 2nd and 3rd digits are sore. Chief Complaint  Patient presents with   [facility]    Rm4 Not diabetic Dr. Katheen last visit May 2025   PCP is Nche, Roselie Rockford, NP.  Allergies  Allergen Reactions   Influenza Vaccines     D/t being allergic to eggs   Shellfish Allergy Swelling    Lip, throat, tongue swelling  Other Reaction(s): Respiratory Distress   Tree Extract Anaphylaxis   Other Itching    Newspaper Dye   Celecoxib     Able to take IBU   Codeine Swelling   Compazine [Prochlorperazine]    Gabapentin Other (See Comments)    Dizziness and somnolence   Iodine     Has shellfish allergy, would prefer not to take   Lisinopril  Cough    cough   Peanut-Containing Drug Products     Itching  Other Reaction(s): Other (See Comments)  Itching  Other Reaction(s): Other (See Comments)   Prednisone     Rapid heart rate   Prochlorperazine Edisylate     paralyzed   Robinul  [Glycopyrrolate ]     boils   Shrimp Extract Other (See Comments)    Other reaction(s): Not available   Vioxx [Rofecoxib]     States able to take IBU   Aspirin Rash    States able to take IBU   Egg-Derived Products Rash   Epinephrine Rash   Erythromycin Rash   Sulfa Antibiotics Rash    Other Reaction(s): Other (See Comments)   Tetracycline Rash    Review of Systems: Negative except as noted in the HPI.  Objective:  There were no vitals filed for this visit. Mary Collier is a pleasant 73 y.o. female WD, WN in NAD.  AAO x 3.  Vascular Examination: Capillary refill time immediate b/l. Palpable pedal pulses. Pedal hair present b/l. No pain with calf compression b/l. Skin temperature gradient WNL b/l. No cyanosis or clubbing b/l. No ischemia or gangrene noted b/l.   Neurological Examination: Sensation grossly intact b/l with 10 gram monofilament.  Dermatological Examination: Pedal skin with normal turgor, texture and tone b/l.  No open wounds. No interdigital macerations.   Toenails 2-5 b/l thick, discolored, elongated with subungual debris and pain on dorsal palpation.   Incurvated nailplate lateral border of R 2nd toe and R 3rd toe.  Nail border hypertrophy absent. There is tenderness to palpation. Sign(s) of infection: no clinical signs of infection noted on examination today.. Anonychia noted left great toe and right great toe. Nailbed(s) epithelialized.  No hyperkeratotic nor porokeratotic lesions present on today's visit.  Musculoskeletal Examination: Muscle strength 5/5 to all lower extremity muscle groups bilaterally. No pain, crepitus or joint limitation noted with ROM b/l LE. No gross bony pedal deformities b/l. Patient ambulates with cane assistance.  Radiographs: None  Last A1c:      Latest Ref Rng & Units 02/25/2024    1:51 PM 08/27/2023   11:43 AM  Hemoglobin A1C  Hemoglobin-A1c 4.6 - 6.5 % 6.7  6.7  Assessment/Plan: 1. Pain due to onychomycosis of toenails of both feet   2. Type 2 diabetes mellitus with hyperglycemia, without long-term current use of insulin (HCC)     Patient was evaluated and treated. All patient's and/or POA's questions/concerns addressed on today's visit. Toenails 2-5 bilaterally debrided in length and girth without incident. Continue foot and shoe inspections daily. Monitor blood glucose per PCP/Endocrinologist's recommendations. Continue soft, supportive shoe gear daily. Report any pedal injuries to medical professional. Call office if there are any  questions/concerns. -Patient/POA to call should there be question/concern in the interim.   Return in about 3 months (around 08/09/2024).  Delon LITTIE Merlin, DPM      [facility] LOCATION: [address] [place] [address]                   Office [phone]   [facility] LOCATION: [address] [place], [place] [address] Office [phone]

## 2024-05-16 NOTE — Progress Notes (Unsigned)
 [facility] Return Visit   Referring Provider Nche, Roselie Rockford, NP [address] [place],  [place] [address]  Primary Care Provider Nche, Roselie Rockford, NP  Patient Profile: Mary Collier is a 73 y.o. female with a past medical history noteworthy for HTN, atherosclerosis, HLD, diabetes mellitus, fibromyalgia who returns to the [facility] for follow-up of the problem(s) noted below.  Problem List: IBS-D, associated with urgency, mild LUQ abdominal pain, nausea  GERD, well controlled  Intermittent rectal pain likely proctalgia fugax Personal history of adenomatous colon polyps History of H. pylori gastritis, treated in 2018   History of Present Illness   Ms. Vessels was last seen in the GI office***   Current GI Meds    Interval History    Last colonoscopy: *** Last endoscopy: ***  Last Abd CT/CTE/MRE: ***  GI Review of Symptoms Significant for {GIROS:50592}. Otherwise negative.  General Review of Systems  Review of systems is significant for the pertinent positives and negatives as listed per the HPI.  Full ROS is otherwise negative.  Past Medical History   Past Medical History:  Diagnosis Date   Allergic rhinitis    Allergy    Arthritis    Asthma    Blood transfusion without reported diagnosis    1966   Cancer (HCC)    at age 57.  abdominal   Cancer of kidney (HCC)    at age 40.  Right kidney.   Chronic headache    Dizziness 03/11/2022   Edema    GERD (gastroesophageal reflux disease)    Glaucoma    Hiatal hernia    Hypertension    IBS (irritable bowel syndrome)    Internal hemorrhoid    Lumbago    Lump or mass in breast    Lung cancer (HCC)    at age 86  Right lung.   Other abnormal blood chemistry    Other and unspecified hyperlipidemia    Other convulsions    hx seizures at age 76   Peptic stricture of esophagus    Reflux esophagitis    Tubular adenoma of colon 10/20/2013   Unspecified  menopausal and postmenopausal disorder    Unspecified vitamin D  deficiency      Past Surgical History   Past Surgical History:  Procedure Laterality Date   ABDOMINAL SURGERY  at age 29    BREAST SURGERY  94s and 55s   Bilateral cysts removed   KIDNEY SURGERY  at age 3   Right   LUNG SURGERY  at age 74   Right   NASAL SINUS SURGERY  1980s   TONSILLECTOMY  1970   TOTAL ABDOMINAL HYSTERECTOMY  2000s     Allergies and Medications   Allergies  Allergen Reactions   Influenza Vaccines     D/t being allergic to eggs   Shellfish Allergy Swelling    Lip, throat, tongue swelling  Other Reaction(s): Respiratory Distress   Tree Extract Anaphylaxis   Other Itching    Newspaper Dye   Celecoxib     Able to take IBU   Codeine Swelling   Compazine [Prochlorperazine]    Gabapentin Other (See Comments)    Dizziness and somnolence   Iodine     Has shellfish allergy, would prefer not to take   Lisinopril  Cough    cough   Peanut-Containing Drug Products     Itching  Other Reaction(s): Other (See Comments)  Itching  Other Reaction(s): Other (See Comments)   Prednisone  Rapid heart rate   Prochlorperazine Edisylate     paralyzed   Robinul  [Glycopyrrolate ]     boils   Shrimp Extract Other (See Comments)    Other reaction(s): Not available   Vioxx [Rofecoxib]     States able to take IBU   Aspirin Rash    States able to take IBU   Egg-Derived Products Rash   Epinephrine Rash   Erythromycin Rash   Sulfa Antibiotics Rash    Other Reaction(s): Other (See Comments)   Tetracycline Rash   @MEDSTODAY @  Family His   Family History  Problem Relation Age of Onset   Allergies Sister        x3   Asthma Sister    Bone cancer Father    Lung cancer Father    Stomach cancer Father    Esophageal cancer Father    Breast cancer Mother    Prostate cancer Brother    Pancreatic cancer Brother        mets from prostate   Colon cancer Brother    Hypertension Brother     Prostate cancer Brother    Bone cancer Brother    Diabetes Brother    Heart disease Sister    Diabetes Sister    Hypertension Sister    Breast cancer Sister        mastectomy   Stroke Sister    Rectal cancer Neg Hx    Liver cancer Neg Hx    GI Specific Family History: {gifamhx:50061}   Social History   Social History   Tobacco Use   Smoking status: Never   Smokeless tobacco: Never  Vaping Use   Vaping status: Never Used  Substance Use Topics   Alcohol use: No   Drug use: No   Uldine reports that she has never smoked. She has never used smokeless tobacco. She reports that she does not drink alcohol and does not use drugs.  Vital Signs and Physical Examination   There were no vitals filed for this visit. There is no height or weight on file to calculate BMI.    General: Well developed, well nourished, no acute distress Head: Normocephalic and atraumatic Eyes: Sclerae anicteric, EOMI Ears: Normal auditory acuity Mouth: No deformities or lesions noted Lungs: Clear throughout to auscultation Heart: Regular rate and rhythm; No murmurs, rubs or bruits Abdomen: Soft, non tender and non distended. No masses, hepatosplenomegaly or hernias noted. Normal Bowel sounds Rectal: Musculoskeletal: Symmetrical with no gross deformities  Pulses:  Normal pulses noted Extremities: No edema or deformities noted Neurological: Alert oriented x 4, grossly nonfocal Psychological:  Alert and cooperative. Normal mood and affect   Review of Data   The following data was reviewed at the time of this encounter:   Laboratory Studies      Latest Ref Rng & Units 02/25/2024    1:51 PM 07/12/2023    1:13 PM 07/22/2022    9:08 AM  CBC  WBC 4.0 - 10.5 K/uL 7.9  8.2  6.9   Hemoglobin 12.0 - 15.0 g/dL 87.4  87.4  87.2   Hematocrit 36.0 - 46.0 % 37.2  37.4  37.9   Platelets 150.0 - 400.0 K/uL 224.0  235  198.0     Lab Results  Component Value Date   LIPASE 21 07/12/2023      Latest Ref  Rng & Units 02/25/2024    1:51 PM 08/27/2023   11:43 AM 07/12/2023    1:13 PM  CMP  Glucose 70 -  99 mg/dL 899  95  97   BUN 6 - 23 mg/dL 21  17  31    Creatinine 0.40 - 1.20 mg/dL 8.87  8.84  8.31   Sodium 135 - 145 mEq/L 137  136  135   Potassium 3.5 - 5.1 mEq/L 3.7  4.2  3.5   Chloride 96 - 112 mEq/L 101  99  99   CO2 19 - 32 mEq/L 26  30  27    Calcium  8.4 - 10.5 mg/dL 9.5  9.4  9.6   Total Protein 6.0 - 8.3 g/dL 8.2     Total Bilirubin 0.2 - 1.2 mg/dL 0.3     Alkaline Phos 39 - 117 U/L 65     AST 0 - 37 U/L 17     ALT 0 - 35 U/L 14        Imaging Studies  CTAP 02/07/2021 1. Mild prominence of gas and stool in the colon potentially reflecting constipation. 2. Sigmoid colon diverticulosis without current findings of active diverticulitis. 3. Small hypodense lesions in the liver and left kidney are technically nonspecific but not significantly changed from 2020 and likely cysts. 4.  Aortic Atherosclerosis (ICD10-I70.0). 5. Mild lower lumbar spondylosis and degenerative disc disease. 6. Stable scarring in the posterior basal segment right lower lobe. Old nonunited right posterior rib fractures. 7. Mild cardiomegaly.  Mitral valve calcifications noted.  GI Procedures and Studies  Colonoscopy 08/2019 - Four 6 to 7 mm polyps in the descending colon and in the ascending colon, removed with a cold snare. Resected and retrieved.  - Mild diverticulosis in the left colon.  - Internal hemorrhoids.  - The examination was otherwise normal on direct and retroflexion views.  Path: 1 TA, 2HPs, 1 inflammatory  EGD 04/2017 - Normal esophagus.  - Gastritis. Biopsied.  - Normal duodenal bulb and second portion of the duodenum Path: H. pylori positive chronic gastritis  Colonoscopy 05/2014 1. Semi-pedunculated polyp measuring 8 mm in the descending colon; polypectomy performed with a cold snare  2. Sessile polyp measuring 4 mm in the sigmoid colon; polypectomy performed with cold forceps  3.  Moderate internal hemorrhoids  Path: TA  EGD 01/26/2003 - Mild esophagitis - Gastritis  Clinical Impression  It is my clinical impression that Ms. Saffo is a 73 y.o. female with;  ***  Plan  *** *** *** *** ***   Planned Follow Up No follow-ups on file.  The patient or caregiver verbalized understanding of the material covered, with no barriers to understanding. All questions were answered. Patient or caregiver is agreeable with the plan outlined above.    It was a pleasure to see Lillyen.  If you have any questions or concerns regarding this evaluation, do not hesitate to contact me.  Inocente Hausen, MD [facility]

## 2024-05-17 ENCOUNTER — Encounter: Payer: Self-pay | Admitting: Pediatrics

## 2024-05-17 ENCOUNTER — Ambulatory Visit: Admitting: Pediatrics

## 2024-05-17 VITALS — BP 124/70 | HR 68 | Ht 61.0 in | Wt 176.0 lb

## 2024-05-17 DIAGNOSIS — K580 Irritable bowel syndrome with diarrhea: Secondary | ICD-10-CM | POA: Diagnosis not present

## 2024-05-17 DIAGNOSIS — K6289 Other specified diseases of anus and rectum: Secondary | ICD-10-CM | POA: Diagnosis not present

## 2024-05-17 DIAGNOSIS — K219 Gastro-esophageal reflux disease without esophagitis: Secondary | ICD-10-CM | POA: Diagnosis not present

## 2024-05-17 DIAGNOSIS — R1310 Dysphagia, unspecified: Secondary | ICD-10-CM

## 2024-05-17 DIAGNOSIS — K594 Anal spasm: Secondary | ICD-10-CM

## 2024-05-17 DIAGNOSIS — Z860101 Personal history of adenomatous and serrated colon polyps: Secondary | ICD-10-CM

## 2024-05-17 DIAGNOSIS — K589 Irritable bowel syndrome without diarrhea: Secondary | ICD-10-CM

## 2024-05-17 DIAGNOSIS — Z8619 Personal history of other infectious and parasitic diseases: Secondary | ICD-10-CM

## 2024-05-17 DIAGNOSIS — R109 Unspecified abdominal pain: Secondary | ICD-10-CM

## 2024-05-17 MED ORDER — AMBULATORY NON FORMULARY MEDICATION: 0 refills | Status: AC

## 2024-05-17 MED ORDER — IBGARD 90 MG PO CPCR: ORAL_CAPSULE | ORAL | 0 refills | Status: AC

## 2024-05-17 NOTE — Patient Instructions (Signed)
 We have sent the following medications to your pharmacy for you to pick up at your convenience: We have sent a prescription for Diltiazem gel 2% with Lidocaine  5% to [facility] to treat your anal fissure. You should apply a pea size amount to your rectum four (4) times daily for four (4) weeks  [facility] information is below: Address: [address], [place], [place] [address]  [phone]  *Please DO NOT go directly from our office to pick up this medication! Give the pharmacy 1 day to process the prescription as this is compounded and takes time to make.   Please purchase the following medications over the counter and take as directed: IBgard, take 2 capsules daily as needed.  Your provider has requested that you have an abdominal x ray before leaving today. Please go to the basement floor to our Radiology department for the test.  You have been scheduled for an endoscopy. Please follow written instructions given to you at your visit today.  If you use inhalers (even only as needed), please bring them with you on the day of your procedure.  If you take any of the following medications, they will need to be adjusted prior to your procedure:   DO NOT TAKE 7 DAYS PRIOR TO TEST- Trulicity (dulaglutide) Ozempic, Wegovy (semaglutide) Mounjaro (tirzepatide) Bydureon Bcise (exanatide extended release)  DO NOT TAKE 1 DAY PRIOR TO YOUR TEST Rybelsus (semaglutide) Adlyxin (lixisenatide) Victoza (liraglutide) Byetta (exanatide) ___________________________________________________________________________   Thank you for entrusting me with your care and for choosing [facility], Dr. Inocente Hausen  _______________________________________________________  If your blood pressure at your visit was 140/90 or greater, please contact your primary care physician to follow up on this.  _______________________________________________________  If you are age 72  or older, your body mass index should be between 23-30. Your Body mass index is 33.25 kg/m. If this is out of the aforementioned range listed, please consider follow up with your Primary Care Provider.  If you are age 66 or younger, your body mass index should be between 19-25. Your Body mass index is 33.25 kg/m. If this is out of the aformentioned range listed, please consider follow up with your Primary Care Provider.   ________________________________________________________  The [facility] providers would like to encourage you to use MYCHART to communicate with providers for non-urgent requests or questions.  Due to long hold times on the telephone, sending your provider a message by [facility] may be a faster and more efficient way to get a response.  Please allow 48 business hours for a response.  Please remember that this is for non-urgent requests.  _______________________________________________________  [facility] is using a team-based approach to care.  Your team is made up of your doctor and two to three APPS. Our APPS (Nurse Practitioners and Physician Assistants) work with your physician to ensure care continuity for you. They are fully qualified to address your health concerns and develop a treatment plan. They communicate directly with your gastroenterologist to care for you. Seeing the Advanced Practice Practitioners on your physician's team can help you by facilitating care more promptly, often allowing for earlier appointments, access to diagnostic testing, procedures, and other specialty referrals.

## 2024-05-18 ENCOUNTER — Encounter: Payer: Self-pay | Admitting: Pediatrics

## 2024-05-19 NOTE — Progress Notes (Unsigned)
 [facility] History and Physical   Primary Care Physician:  Nche, Roselie Rockford, NP   Reason for Procedure:  GERD, dysphagia  Plan:    Upper endoscopy     HPI: Mary Collier is a 73 y.o. female undergoing upper endoscopy for investigation of GERD, dysphagia.  Patient is currently on omeprazole  40 mg p.o. twice daily.  Denies heartburn but describes frequent regurgitation.  Also endorses dysphagia predominantly to solids.  Last EGD performed in 2018 showed a normal esophagus but diffuse inflammation of the stomach.  Biopsies were noteworthy for chronic gastritis with H. pylori.  Barium esophagram in 2022 showed mild esophageal dysmotility, likely presbyesophagus without any other explanation for dysphagia.  Patient reports that she noted significant benefit from esophageal dilations in the past.  Records indicate that in the early 2000's she had a history of a distal esophageal ring for which she underwent savory dilation up to 18 mm.  Past Medical History:  Diagnosis Date   Allergic rhinitis    Allergy    Arthritis    Asthma    Blood transfusion without reported diagnosis    1966   Cancer (HCC)    at age 81.  abdominal   Cancer of kidney (HCC)    at age 59.  Right kidney.   Chronic headache    Dizziness 03/11/2022   Edema    GERD (gastroesophageal reflux disease)    Glaucoma    Hiatal hernia    Hypertension    IBS (irritable bowel syndrome)    Internal hemorrhoid    Lumbago    Lump or mass in breast    Lung cancer (HCC)    at age 13  Right lung.   Other abnormal blood chemistry    Other and unspecified hyperlipidemia    Other convulsions    hx seizures at age 40   Peptic stricture of esophagus    Reflux esophagitis    Tubular adenoma of colon 10/20/2013   Unspecified menopausal and postmenopausal disorder    Unspecified vitamin D  deficiency     Past Surgical History:  Procedure Laterality Date   ABDOMINAL SURGERY  at age 37    BREAST SURGERY  1s  and 1980s   Bilateral cysts removed   KIDNEY SURGERY  at age 3   Right   LUNG SURGERY  at age 52   Right   NASAL SINUS SURGERY  1980s   TONSILLECTOMY  1970   TOTAL ABDOMINAL HYSTERECTOMY  2000s    Prior to Admission medications   Medication Sig Start Date End Date Taking? Authorizing Provider  acetaminophen  (TYLENOL ) 325 MG tablet Take 325 mg by mouth as needed.    [provider]  AMBULATORY NON FORMULARY MEDICATION Medication Name: Diltiazem 2% gel + Lidaocaine 5% Apply a pea sized amount internally four times daily. 05/17/24   Suzann Inocente HERO, MD  amLODipine  (NORVASC ) 5 MG tablet Take 1 tablet (5 mg total) by mouth every evening. 05/12/24   Nche, Roselie Rockford, NP  calcium -vitamin D  (OSCAL 500/200 D-3) 500-200 MG-UNIT per tablet Take 1 tablet by mouth 2 (two) times daily. 07/12/14   Arnetha Heft, MD  ciclopirox (LOPROX) 0.77 % cream Apply 1 Application topically 2 (two) times daily. 05/11/24   [provider]  Ciclopirox 1 % shampoo SMARTSIG:Sparingly Topical Daily 05/12/23   [provider]  [facility] SENNA PLUS 8.6-50 MG tablet TAKE 1 TABLET BY MOUTH DAILY. 01/04/16   Caro Harlene POUR, NP  diclofenac  sodium (VOLTAREN ) 1 %  GEL APPLY 2 G TOPICALLY 3 (THREE) TIMES DAILY AS NEEDED. 06/04/19   Nche, Roselie Rockford, NP  dimenhyDRINATE (DRAMAMINE) 50 MG tablet Take 50 mg by mouth every 8 (eight) hours as needed for dizziness.    Emergency, Nurse, RN  dorzolamide-timolol (COSOPT) 2-0.5 % ophthalmic solution Place 1 drop into both eyes 2 (two) times daily. 12/28/23   [provider]  fluticasone  (FLONASE ) 50 MCG/ACT nasal spray SPRAY 2 SPRAYS INTO EACH NOSTRIL EVERY DAY 06/20/19   Nche, Charlotte Lum, NP  folic acid  (FOLVITE ) 1 MG tablet Take 1 mg by mouth daily.    [provider]  hydrochlorothiazide  (HYDRODIURIL ) 25 MG tablet Take 1 tablet (25 mg total) by mouth daily. 05/12/24   Nche, Roselie Rockford, NP  ibuprofen  (ADVIL ) 800 MG tablet Take 1 tablet (800 mg  total) by mouth every 8 (eight) hours as needed. 08/27/22   Sofia, Leslie K, PA-C  ipratropium (ATROVENT ) 0.03 % nasal spray     [provider]  losartan  (COZAAR ) 100 MG tablet TAKE 1 TABLET BY MOUTH EVERY DAY 02/26/24   Nche, Charlotte Lum, NP  metFORMIN  (GLUCOPHAGE ) 500 MG tablet Take 0.5 tablets (250 mg total) by mouth daily with breakfast. 05/12/24   Nche, Roselie Rockford, NP  nortriptyline  (PAMELOR ) 25 MG capsule TAKE 1 CAPSULE BY MOUTH EVERYDAY AT BEDTIME Patient not taking: Reported on 05/17/2024 01/05/24   Skeet Juliene SAUNDERS, DO  omeprazole  (PRILOSEC) 40 MG capsule Take 1 capsule (40 mg total) by mouth daily. 10/15/23   Aneita Gwendlyn DASEN, MD  Peppermint Oil (IBGARD) 90 MG CPCR Take as directed. 05/17/24   Suzann Inocente HERO, MD  Phenylephrine HCl (PREPARATION H) 0.25 % SUPP Place rectally daily as needed.    [provider]  RESTASIS 0.05 % ophthalmic emulsion 1 drop 2 (two) times daily. 01/16/20   [provider]  rosuvastatin  (CRESTOR ) 20 MG tablet TAKE 1 TABLET BY MOUTH EVERY DAY 03/02/24   Nche, Charlotte Lum, NP  triamcinolone  cream (KENALOG ) 0.1 % Apply topically 2 (two) times daily. 06/23/23   [provider]    Current Outpatient Medications  Medication Sig Dispense Refill   amLODipine  (NORVASC ) 5 MG tablet Take 1 tablet (5 mg total) by mouth every evening. 90 tablet 1   calcium -vitamin D  (OSCAL 500/200 D-3) 500-200 MG-UNIT per tablet Take 1 tablet by mouth 2 (two) times daily. 60 tablet 12   ciclopirox (LOPROX) 0.77 % cream Apply 1 Application topically 2 (two) times daily.     Ciclopirox 1 % shampoo SMARTSIG:Sparingly Topical Daily     dorzolamide-timolol (COSOPT) 2-0.5 % ophthalmic solution Place 1 drop into both eyes 2 (two) times daily.     folic acid  (FOLVITE ) 1 MG tablet Take 1 mg by mouth daily.     hydrochlorothiazide  (HYDRODIURIL ) 25 MG tablet Take 1 tablet (25 mg total) by mouth daily. 90 tablet 1   losartan  (COZAAR ) 100 MG tablet TAKE 1 TABLET BY  MOUTH EVERY DAY 90 tablet 1   metFORMIN  (GLUCOPHAGE ) 500 MG tablet Take 0.5 tablets (250 mg total) by mouth daily with breakfast. 45 tablet 1   omeprazole  (PRILOSEC) 40 MG capsule Take 1 capsule (40 mg total) by mouth daily. 30 capsule 11   Peppermint Oil (IBGARD) 90 MG CPCR Take as directed. 20 capsule 0   rosuvastatin  (CRESTOR ) 20 MG tablet TAKE 1 TABLET BY MOUTH EVERY DAY 90 tablet 1   acetaminophen  (TYLENOL ) 325 MG tablet Take 325 mg by mouth as needed.  AMBULATORY NON FORMULARY MEDICATION Medication Name: Diltiazem 2% gel + Lidaocaine 5% Apply a pea sized amount internally four times daily. 30 g 0   [facility] SENNA PLUS 8.6-50 MG tablet TAKE 1 TABLET BY MOUTH DAILY. 90 tablet 1   diclofenac  sodium (VOLTAREN ) 1 % GEL APPLY 2 G TOPICALLY 3 (THREE) TIMES DAILY AS NEEDED. 100 g 0   dimenhyDRINATE (DRAMAMINE) 50 MG tablet Take 50 mg by mouth every 8 (eight) hours as needed for dizziness.     fluticasone  (FLONASE ) 50 MCG/ACT nasal spray SPRAY 2 SPRAYS INTO EACH NOSTRIL EVERY DAY 48 mL 0   ibuprofen  (ADVIL ) 800 MG tablet Take 1 tablet (800 mg total) by mouth every 8 (eight) hours as needed. (Patient not taking: Reported on 05/20/2024) 30 tablet 0   ipratropium (ATROVENT ) 0.03 % nasal spray  (Patient not taking: Reported on 05/20/2024)     nortriptyline  (PAMELOR ) 25 MG capsule TAKE 1 CAPSULE BY MOUTH EVERYDAY AT BEDTIME (Patient not taking: Reported on 05/20/2024) 90 capsule 0   Phenylephrine HCl (PREPARATION H) 0.25 % SUPP Place rectally daily as needed.     RESTASIS 0.05 % ophthalmic emulsion 1 drop 2 (two) times daily.     triamcinolone  cream (KENALOG ) 0.1 % Apply topically 2 (two) times daily.     Current Facility-Administered Medications  Medication Dose Route Frequency Provider Last Rate Last Admin   0.9 %  sodium chloride  infusion  500 mL Intravenous Once Kevyn Wengert M, MD        Allergies as of 05/20/2024 - Review Complete 05/20/2024  Allergen Reaction Noted   Codeine Swelling    Compazine  [prochlorperazine] Other (See Comments) 04/09/2016   Dicyclomine  Other (See Comments) 05/20/2024   Influenza vaccines  03/22/2013   Iodine Other (See Comments) 12/19/2020   Nortriptyline  Other (See Comments) 05/20/2024   Prochlorperazine edisylate Other (See Comments)    Shellfish allergy Swelling 03/25/2017   Shrimp extract Swelling and Other (See Comments) 06/10/2022   Tree extract Anaphylaxis 12/22/2022   Other Itching 04/27/2017   Peanut-containing drug products Itching 07/12/2014   Robinul  [glycopyrrolate ] Other (See Comments) 04/03/2018   Vioxx [rofecoxib] Itching 06/18/2011   Aspirin Rash    Celecoxib Other (See Comments)    Egg-derived products Rash 03/22/2013   Epinephrine Rash    Erythromycin Rash    Gabapentin Other (See Comments) 11/18/2019   Lisinopril  Cough 04/25/2019   Prednisone Palpitations 04/16/2011   Sulfa antibiotics Rash 04/16/2011   Tetracycline Rash     Family History  Problem Relation Age of Onset   Allergies Sister        x3   Asthma Sister    Bone cancer Father    Lung cancer Father    Stomach cancer Father    Esophageal cancer Father    Breast cancer Mother    Prostate cancer Brother    Pancreatic cancer Brother        mets from prostate   Colon cancer Brother    Hypertension Brother    Prostate cancer Brother    Bone cancer Brother    Diabetes Brother    Heart disease Sister    Diabetes Sister    Hypertension Sister    Breast cancer Sister        mastectomy   Stroke Sister    Rectal cancer Neg Hx    Liver cancer Neg Hx     Social History   Socioeconomic History   Marital status: Married    Spouse name: Not on  file   Number of children: 0   Years of education: Not on file   Highest education level: Bachelor's degree (e.g., BA, AB, BS)  Occupational History   Occupation: education specialist (preschool)   Tobacco Use   Smoking status: Never   Smokeless tobacco: Never  Vaping Use   Vaping status: Never Used  Substance and  Sexual Activity   Alcohol use: No   Drug use: No   Sexual activity: Not on file  Other Topics Concern   Not on file  Social History Narrative   ** Merged History Encounter **       Pt has 18 siblings. No caffeine drinks  Right handed One story home   Social Drivers of Health   Financial Resource Strain: Low Risk  (05/12/2024)   Overall Financial Resource Strain (CARDIA)    Difficulty of Paying Living Expenses: Not hard at all  Food Insecurity: No Food Insecurity (05/12/2024)   Hunger Vital Sign    Worried About Running Out of Food in the Last Year: Never true    Ran Out of Food in the Last Year: Never true  Transportation Needs: No Transportation Needs (05/12/2024)   PRAPARE - Administrator, Civil Service (Medical): No    Lack of Transportation (Non-Medical): No  Physical Activity: Inactive (05/12/2024)   Exercise Vital Sign    Days of Exercise per Week: 0 days    Minutes of Exercise per Session: Not on file  Stress: No Stress Concern Present (05/12/2024)   [employer] of Occupational Health - Occupational Stress Questionnaire    Feeling of Stress: Not at all  Social Connections: Socially Integrated (05/12/2024)   Social Connection and Isolation Panel    Frequency of Communication with Friends and Family: More than three times a week    Frequency of Social Gatherings with Friends and Family: Once a week    Attends Religious Services: More than 4 times per year    Active Member of [employer] or Organizations: Yes    Attends Banker Meetings: More than 4 times per year    Marital Status: Married  Catering manager Violence: Unknown (09/05/2022)   Received from [facility]   HITS    Physically Hurt: Not on file    Insult or Talk Down To: Not on file    Threaten Physical Harm: Not on file    Scream or Curse: Not on file    Review of Systems:  All other review of systems negative except as mentioned in the HPI.  Physical Exam: Vital signs BP  (!) 130/56   Pulse (!) 58   Temp 98.6 F (37 C) (Skin)   Ht 5' 1 (1.549 m)   Wt 176 lb (79.8 kg)   SpO2 100%   BMI 33.25 kg/m   General:   Alert,  Well-developed, well-nourished, pleasant and cooperative in NAD Airway:  Mallampati         3               Lungs:  Clear throughout to auscultation.   Heart:  Regular rate and rhythm; no murmurs, clicks, rubs,  or gallops. Abdomen:  Soft, TTP in epigastrium and nondistended. Normal bowel sounds.   Neuro/Psych:  Normal mood and affect. A and O x 3  Inocente Hausen, MD [facility]

## 2024-05-20 ENCOUNTER — Encounter: Payer: Self-pay | Admitting: Pediatrics

## 2024-05-20 ENCOUNTER — Ambulatory Visit: Admitting: Pediatrics

## 2024-05-20 VITALS — BP 139/74 | HR 60 | Temp 98.6°F | Resp 20 | Ht 61.0 in | Wt 176.0 lb

## 2024-05-20 DIAGNOSIS — K2950 Unspecified chronic gastritis without bleeding: Secondary | ICD-10-CM

## 2024-05-20 DIAGNOSIS — K219 Gastro-esophageal reflux disease without esophagitis: Secondary | ICD-10-CM

## 2024-05-20 DIAGNOSIS — R1310 Dysphagia, unspecified: Secondary | ICD-10-CM

## 2024-05-20 DIAGNOSIS — B9681 Helicobacter pylori [H. pylori] as the cause of diseases classified elsewhere: Secondary | ICD-10-CM

## 2024-05-20 DIAGNOSIS — R109 Unspecified abdominal pain: Secondary | ICD-10-CM

## 2024-05-20 DIAGNOSIS — K2289 Other specified disease of esophagus: Secondary | ICD-10-CM | POA: Diagnosis not present

## 2024-05-20 MED ORDER — SODIUM CHLORIDE 0.9 % IV SOLN: 500.0000 mL | Freq: Once | INTRAVENOUS | Status: DC

## 2024-05-20 NOTE — Op Note (Signed)
 [facility] Patient Name: Mary Collier Procedure Date: 05/20/2024 1:16 PM MRN: [id number] Endoscopist: Inocente Hausen , MD, [id number] Age: 73 Referring MD:  Date of Birth: 11-Dec-1950 Gender: Female Account #: [id number] Procedure:                Upper GI endoscopy Indications:              Abdominal pain in the left upper quadrant,                            Dysphagia, Follow-up of gastro-esophageal reflux                            disease, Previously treated for Helicobacter pylori Medicines:                Monitored Anesthesia Care Procedure:                Pre-Anesthesia Assessment:                           - Prior to the procedure, a History and Physical                            was performed, and patient medications and                            allergies were reviewed. The patient's tolerance of                            previous anesthesia was also reviewed. The risks                            and benefits of the procedure and the sedation                            options and risks were discussed with the patient.                            All questions were answered, and informed consent                            was obtained. Prior Anticoagulants: The patient has                            taken no anticoagulant or antiplatelet agents. ASA                            Grade Assessment: III - A patient with severe                            systemic disease. After reviewing the risks and                            benefits, the patient was deemed in satisfactory  condition to undergo the procedure.                           After obtaining informed consent, the endoscope was                            passed under direct vision. Throughout the                            procedure, the patient's blood pressure, pulse, and                            oxygen saturations were monitored continuously. The                            Olympus  Scope [id number] was introduced through the                            mouth, and advanced to the second part of duodenum.                            The upper GI endoscopy was accomplished without                            difficulty. The patient tolerated the procedure                            well. Scope In: Scope Out: Findings:                 No endoscopic abnormality was evident in the                            esophagus to explain the patient's complaint of                            dysphagia. No webs, rings, strictures are seen. The                            patient did have some degree of presbyesophagus                            that could potentially contribute to some degree of                            dysphagia. It was decided, however, to proceed with                            dilation of the entire esophagus. A guidewire was                            placed and the scope was withdrawn. Dilation was  performed with a Savary dilator with no resistance                            at 15 mm and 16 mm and moderate resistance at 17 mm.                           The gastric body, gastric antrum, cardia (on                            retroflexion) and gastric fundus (on retroflexion)                            were normal. Biopsies were taken with a cold                            forceps for Helicobacter pylori testing.                           The duodenal bulb and second portion of the                            duodenum were normal. Complications:            No immediate complications. Estimated blood loss:                            Minimal. Estimated Blood Loss:     Estimated blood loss was minimal. Impression:               - No endoscopic esophageal abnormality to explain                            patient's dysphagia with the exception of some                            degree of presbyesophagus. Esophagus dilated to 17                             mm with Savary dilator. Patient is also known to                            have esophageal motility disorder on barium swallow                            which could be contributing to symptomatology.                           - Normal gastric body, antrum, cardia and gastric                            fundus. Biopsied.                           - Normal duodenal bulb and second portion of the  duodenum. Recommendation:           - Discharge patient to home (ambulatory).                           - Await pathology results.                           - Continue present medications.                           - The findings and recommendations were discussed                            with the patient's family.                           - Return to GI clinic as previously scheduled. Inocente Hausen, MD 05/20/2024 1:42:08 PM This report has been signed electronically.

## 2024-05-20 NOTE — Progress Notes (Signed)
 Vss nad trans to pacu

## 2024-05-20 NOTE — Patient Instructions (Signed)
 YOU HAD AN ENDOSCOPIC PROCEDURE TODAY AT THE [facility]:   Refer to the procedure report that was given to you for any specific questions about what was found during the examination.  If the procedure report does not answer your questions, please call your gastroenterologist to clarify.  If you requested that your care partner not be given the details of your procedure findings, then the procedure report has been included in a sealed envelope for you to review at your convenience later.  YOU SHOULD EXPECT: Some feelings of bloating in the abdomen. Passage of more gas than usual.  Walking can help get rid of the air that was put into your GI tract during the procedure and reduce the bloating. If you had a lower endoscopy (such as a colonoscopy or flexible sigmoidoscopy) you may notice spotting of blood in your stool or on the toilet paper. If you underwent a bowel prep for your procedure, you may not have a normal bowel movement for a few days.  Please Note:  You might notice some irritation and congestion in your nose or some drainage.  This is from the oxygen used during your procedure.  There is no need for concern and it should clear up in a day or so.  SYMPTOMS TO REPORT IMMEDIATELY:   Following upper endoscopy (EGD)  Vomiting of blood or coffee ground material  New chest pain or pain under the shoulder blades  Painful or persistently difficult swallowing  New shortness of breath  Fever of 100F or higher  Black, tarry-looking stools  Resume previous diet Await pathology results Continue present medications Return to GI clinic as previously scheduled   For urgent or emergent issues, a gastroenterologist can be reached at any hour by calling [phone]. Do not use MyChart messaging for urgent concerns.    DIET:  We do recommend a small meal at first, but then you may proceed to your regular diet.  Drink plenty of fluids but you should avoid alcoholic beverages for 24  hours.  ACTIVITY:  You should plan to take it easy for the rest of today and you should NOT DRIVE or use heavy machinery until tomorrow (because of the sedation medicines used during the test).    FOLLOW UP: Our staff will call the number listed on your records the next business day following your procedure.  We will call around 7:15- 8:00 am to check on you and address any questions or concerns that you may have regarding the information given to you following your procedure. If we do not reach you, we will leave a message.     If any biopsies were taken you will be contacted by phone or by letter within the next 1-3 weeks.  Please call us  at [phone] if you have not heard about the biopsies in 3 weeks.    SIGNATURES/CONFIDENTIALITY: You and/or your care partner have signed paperwork which will be entered into your electronic medical record.  These signatures attest to the fact that that the information above on your After Visit Summary has been reviewed and is understood.  Full responsibility of the confidentiality of this discharge information lies with you and/or your care-partner.

## 2024-05-23 ENCOUNTER — Telehealth: Payer: Self-pay

## 2024-05-23 ENCOUNTER — Telehealth: Payer: Self-pay | Admitting: Nurse Practitioner

## 2024-05-23 NOTE — Telephone Encounter (Signed)
 Patient dropped off document Home Health Certificate (Order ID [id number]), to be filled out by provider. Patient requested to send it back via Fax within 7-days. Document is located in providers tray at front office.Please advise at [phone]  Home health order came through fax I put in the dr box

## 2024-05-23 NOTE — Telephone Encounter (Signed)
  Follow up Call-     05/20/2024   12:35 PM  Call back number  Post procedure Call Back phone  # [phone]  Permission to leave phone message Yes     Patient questions:  Do you have a fever, pain , or abdominal swelling? No. Pain Score  0 *  Have you tolerated food without any problems? Yes.    Have you been able to return to your normal activities? Yes.    Do you have any questions about your discharge instructions: Diet   No. Medications  No. Follow up visit  No.  Do you have questions or concerns about your Care? No.  Actions: * If pain score is 4 or above: No action needed, pain <4.

## 2024-05-24 DIAGNOSIS — R270 Ataxia, unspecified: Secondary | ICD-10-CM | POA: Diagnosis not present

## 2024-05-24 DIAGNOSIS — Z5982 Transportation insecurity: Secondary | ICD-10-CM

## 2024-05-24 DIAGNOSIS — Z7984 Long term (current) use of oral hypoglycemic drugs: Secondary | ICD-10-CM

## 2024-05-24 DIAGNOSIS — Z79899 Other long term (current) drug therapy: Secondary | ICD-10-CM

## 2024-05-24 DIAGNOSIS — G250 Essential tremor: Secondary | ICD-10-CM | POA: Diagnosis not present

## 2024-05-24 DIAGNOSIS — Z9181 History of falling: Secondary | ICD-10-CM

## 2024-05-24 DIAGNOSIS — I10 Essential (primary) hypertension: Secondary | ICD-10-CM | POA: Diagnosis not present

## 2024-05-24 DIAGNOSIS — E119 Type 2 diabetes mellitus without complications: Secondary | ICD-10-CM | POA: Diagnosis not present

## 2024-05-24 NOTE — Telephone Encounter (Signed)
CLINICAL USE BELOW THIS LINE (use X to signify taken)  __X__Form received and placed in providers office for signature. ____Form completed and faxed to LOA Dept. ____Form completed & LVM to notify pt ready for pick up ____Charge sheet & copy of form in front office folder for office supervisor.   

## 2024-05-25 LAB — SURGICAL PATHOLOGY

## 2024-05-25 NOTE — Telephone Encounter (Signed)
 CLINICAL USE BELOW THIS LINE (use X to signify taken)  ____Form received and placed in providers office for signature. __X__Form completed and faxed to home health agency __X__Form completed & LVM to notify pt ready for pick up __X__Charge sheet & copy of form in front office folder for office supervisor.

## 2024-05-26 ENCOUNTER — Ambulatory Visit: Payer: Self-pay | Admitting: Pediatrics

## 2024-05-27 ENCOUNTER — Other Ambulatory Visit: Payer: Self-pay | Admitting: Pediatrics

## 2024-05-27 MED ORDER — AMOXICILLIN 500 MG PO CAPS: 1000.0000 mg | ORAL_CAPSULE | Freq: Two times a day (BID) | ORAL | 0 refills | Status: AC

## 2024-05-27 MED ORDER — TALICIA 250-12.5-10 MG PO CPDR: 4.0000 | DELAYED_RELEASE_CAPSULE | Freq: Three times a day (TID) | ORAL | 0 refills | Status: DC

## 2024-05-27 MED ORDER — OMEPRAZOLE 20 MG PO CPDR: 20.0000 mg | DELAYED_RELEASE_CAPSULE | Freq: Two times a day (BID) | ORAL | 0 refills | Status: DC

## 2024-05-27 NOTE — Addendum Note (Signed)
 Addended by: Cieanna Stormes N on: 05/27/2024 10:29 AM   Modules accepted: Orders

## 2024-06-01 ENCOUNTER — Telehealth: Payer: Self-pay

## 2024-06-01 NOTE — Telephone Encounter (Signed)
 Lm on vm for patient to return call

## 2024-06-01 NOTE — Telephone Encounter (Signed)
 Patient did not review MyChart message with results and recommendations.   Message about your results  From Mary LOISE Single, RN To Collier, Mary and Delivered 05/27/2024 10:27 AM     Good morning Mrs. Collier,    We have sent a prescription for Talicia  4 capsules by mouth 3 times daily x 14 [facility] to [facility]. If you are not able to get this prescription filled due to insurance coverage or cost please let us  know and we will send in the components.    We will reach out to you about 4-8 weeks after you have completed treatment to remind you to come in for a stool test to check for eradication of the bacteria.    If you have any questions please let us  know.    Thank you    ----- Message -----      From:Nancy M McGreal      Sent:05/26/2024 10:42 PM EDT        Un:Gnbrz A Petrow   Subject:Message about your results  Biopsies of your stomach showed active inflammation in the form of chronic gastritis with associated H. pylori infection.  You were diagnosed with H. pylori infection in 2018 and treated with Flagyl , doxycycline and Pepto-Bismol.  When patients have persistent or recurrent H. pylori infection it is recommended that they undergo treatment with a different antibiotic regimen and they previously took.  I recommend that you undergo treatment with a rifabutin based treatment for H. pylori.  I will request that my office prescribe Talicia  4 capsules 3 times a day for 14 days.  If this is not covered by your insurance we can send in separate prescriptions for the components of Talicia  After patient's complete treatment for H. pylori it is recommended that they undergo testing 4 to 8 weeks later with a stool test to confirm that the bacteria has been cleared from their intestine.  I will request that my office coordinate this for you.

## 2024-06-03 NOTE — Telephone Encounter (Signed)
 Patient returned call, requesting a call back to discuss. Please advise.

## 2024-06-06 NOTE — Telephone Encounter (Signed)
 Called and spoke with patient. We reviewed pathology results and recommendations. Patient has been advised that Mary Collier  prescription was sent to pharmacy on 05/27/24 and she will need to contact pharmacy to get this filled. Patient will let us  know if medication is not cost effective or if she is not able to get the medication for any reason and we will send in the components. Patient has been advised to send a MyChart message once she is able to start Mary Collier  so that we can place the appropriate lab reminder. Patient reports that Mary Collier did not help and she is still having loose stools. Patient has continued Imodium PRN. Per last OV patient was to complete KUB to assess stool burden but patient did not do this. I advised patient to complete KUB so that we can determine if she is having true diarrhea or overflow diarrhea due to constipation. Patient is aware that no appt is needed for x-ray and it is located in the basement of our office building, order is in the system. Patient also mentioned that Diltiazem cream will cost her $%0, patient states that she will get it this time but if she has to be on it long term she will request an alternative. Patient states that she will check her MyChart today, she was thankful for the call. Patient verbalized understanding of all information and had no concerns at the end of the call.

## 2024-06-08 ENCOUNTER — Telehealth: Payer: Self-pay | Admitting: Pediatrics

## 2024-06-08 NOTE — Telephone Encounter (Signed)
 Inbound call from patient stating she was prescribed Diltiazem cream and another medication patient states she's not sure if its the Talicia  medication but that she would like to speak to [facility] since she can not afford medications. Patient states one is 50 and the other is 100.  Requesting a call back  Please advise Thank you

## 2024-06-08 NOTE — Telephone Encounter (Signed)
 Called and spoke with patient. Patient states that she went to pick up prescription for H. Pylori treatment. Patient states that the Rifabutin is $100 and the compounded Diltiazem cream is $50. Patient states that she can't afford $150 worth of medication right now. Please advise, thanks.

## 2024-06-09 MED ORDER — OMEPRAZOLE 20 MG PO CPDR: 20.0000 mg | DELAYED_RELEASE_CAPSULE | Freq: Two times a day (BID) | ORAL | 0 refills | Status: AC

## 2024-06-09 MED ORDER — LEVOFLOXACIN 500 MG PO TABS: 500.0000 mg | ORAL_TABLET | Freq: Every day | ORAL | 0 refills | Status: AC

## 2024-06-09 NOTE — Telephone Encounter (Signed)
 Called and spoke with patient regarding Dr. Andy recommendations. Patient will purchase OTC Calmol 4 suppositories. Patient has already picked up Amoxicillin  prescription, patient knows to pick up Levofloxacin  and Omeprazole  for 14 day treatment. Patient verbalized understanding and had no concerns at the end of the call.

## 2024-06-15 LAB — HM DIABETES EYE EXAM

## 2024-07-22 ENCOUNTER — Telehealth: Payer: Self-pay

## 2024-07-22 DIAGNOSIS — A048 Other specified bacterial intestinal infections: Secondary | ICD-10-CM

## 2024-07-22 NOTE — Telephone Encounter (Signed)
-----   Message from Nurse Fredonia B sent at 05/27/2024 10:27 AM EDT ----- Regarding: H. Pylori H. Pylori stool test due - need to order/[employer]

## 2024-07-25 NOTE — Telephone Encounter (Signed)
 Patient returned call. Patient has been advised that she is due for H. Pylori stool test at this time. Patient will stop by 2nd floor receptionist desk to pick up stool kit. Patient has been advised that she will need to collect sample at home and when she returns the sample to the lab she needs to hand it directly to a lab associate. I informed patient that these instructions are in the kit as well. Patient verbalized understanding and had no concerns at the end of the call.

## 2024-07-25 NOTE — Telephone Encounter (Signed)
Lm on home vm for patient to return call.  

## 2024-08-08 ENCOUNTER — Encounter: Payer: Self-pay | Admitting: Pediatrics

## 2024-08-08 ENCOUNTER — Other Ambulatory Visit: Payer: Self-pay

## 2024-08-08 DIAGNOSIS — A048 Other specified bacterial intestinal infections: Secondary | ICD-10-CM

## 2024-08-11 ENCOUNTER — Ambulatory Visit: Admitting: Podiatry

## 2024-08-11 ENCOUNTER — Encounter: Payer: Self-pay | Admitting: Podiatry

## 2024-08-11 DIAGNOSIS — B351 Tinea unguium: Secondary | ICD-10-CM | POA: Diagnosis not present

## 2024-08-11 DIAGNOSIS — M79674 Pain in right toe(s): Secondary | ICD-10-CM

## 2024-08-11 DIAGNOSIS — Z0189 Encounter for other specified special examinations: Secondary | ICD-10-CM

## 2024-08-11 DIAGNOSIS — E1165 Type 2 diabetes mellitus with hyperglycemia: Secondary | ICD-10-CM

## 2024-08-11 DIAGNOSIS — M79675 Pain in left toe(s): Secondary | ICD-10-CM | POA: Diagnosis not present

## 2024-08-11 DIAGNOSIS — E119 Type 2 diabetes mellitus without complications: Secondary | ICD-10-CM | POA: Diagnosis not present

## 2024-08-11 NOTE — Addendum Note (Signed)
 Addended by: GAYNEL DELON CROME on: 08/11/2024 12:51 PM   Modules accepted: Level of Service

## 2024-08-11 NOTE — Progress Notes (Addendum)
 Subjective:  Patient ID: TASHEKA HOUSEMAN, female    DOB: 1951/03/13,  MRN: [id number]  73 y.o. female presents to clinic with  for annual diabetic foot examination and preventative diabetic foot care for painful mycotic toenails of both feet that are difficult to trim. Pain interferes with daily activities and wearing enclosed shoe gear comfortably. She states her right 2nd toe is tender today. Chief Complaint  Patient presents with   Toe Pain    RFC. NP Nche is her PCP. Unsure if she is diabetic. A1c is 5.4   New problem(s): None   PCP is Nche, Roselie Rockford, NP.  Allergies  Allergen Reactions   Codeine Swelling   Compazine [Prochlorperazine] Other (See Comments)   Dicyclomine  Other (See Comments)    Parkinson like symptoms   Influenza Vaccines     D/t being allergic to eggs   Iodine Other (See Comments)    Has shellfish allergy, would prefer not to take   Nortriptyline  Other (See Comments)    Parkinson's like symptoms noted(Jerking, loss of balance)   Prochlorperazine Edisylate Other (See Comments)    paralyzed   Shellfish Allergy Swelling    Lip, throat, tongue swelling  Other Reaction(s): Respiratory Distress   Shrimp Extract Swelling and Other (See Comments)    Other reaction(s): Not available   Tree Extract Anaphylaxis   Other Itching    Newspaper Dye   Peanut-Containing Drug Products Itching    Itching  Other Reaction(s): Other (See Comments)  Itching  Other Reaction(s): Other (See Comments)   Robinul  [Glycopyrrolate ] Other (See Comments)    boils   Vioxx [Rofecoxib] Itching    States able to take IBU   Aspirin Rash    States able to take IBU   Celecoxib Other (See Comments)    Able to take IBU   Egg Protein-Containing Drug Products Rash   Epinephrine Rash   Erythromycin Rash   Gabapentin Other (See Comments)    Dizziness and somnolence   Lisinopril  Cough    cough   Prednisone Palpitations    Rapid heart rate   Sulfa Antibiotics Rash    Other  Reaction(s): Other (See Comments)   Tetracycline Rash    Review of Systems: Negative except as noted in the HPI.   Objective:  KEIRSTIN MUSIL is a pleasant 73 y.o. female WD, WN in NAD. AAO x 3.  Vascular Examination: Vascular status intact b/l with palpable pedal pulses. Pedal hair sparse. CFT immediate b/l. No edema. No pain with calf compression b/l. Skin temperature gradient WNL b/l. No edema noted b/l LE.  Neurological Examination: Sensation grossly intact b/l with 10 gram monofilament.   Dermatological Examination: Pedal skin with normal turgor, texture and tone b/l. 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 No hyperkeratotic lesions noted b/l. Right 2nd digit with incurvated nail plate medial and lateral border. No erythema, no edema, no drainage. +Tenderness to palpation.  Musculoskeletal Examination: Muscle strength 5/5 to b/l LE. No pain, crepitus or joint limitation noted with ROM bilateral LE. No gross bony deformities bilaterally. Utilizes cane for ambulation assistance.  Radiographs: None  Last A1c:      Latest Ref Rng & Units 02/25/2024    1:51 PM 08/27/2023   11:43 AM  Hemoglobin A1C  Hemoglobin-A1c 4.6 - 6.5 % 6.7  6.7      Assessment:   1. Pain due to onychomycosis of toenails of both feet  2. Type 2 diabetes mellitus with hyperglycemia, without long-term current use of insulin (HCC)   3. Encounter for diabetic foot exam (HCC)    Plan:  -Consent given for treatment as described below: -Examined patient. -Diabetic foot examination performed today. -Continue diabetic foot care principles: inspect feet daily, monitor glucose as recommended by PCP and/or Endocrinologist, and follow prescribed diet per PCP, Endocrinologist and/or dietician. -Patient to continue soft, supportive shoe gear daily. -Mycotic toenails 2-5 bilaterally were debrided in  length and girth with sterile nail nippers and dremel without iatrogenic bleeding. -No invasive procedure(s) performed. Offending nail border debrided and curretaged R 2nd toe utilizing sterile nail nipper and currette. Border cleansed with alcohol and triple antibiotic ointment applied. No further treatment required by patient/caregiver. Call office if there are any concerns. -Patient/POA to call should there be question/concern in the interim.  Return in about 3 months (around 11/11/2024).  Delon LITTIE Merlin, DPM      [facility] LOCATION: [address], [place] [address]                   Office [phone]   [facility] LOCATION: [address] [place], [place] [address] Office [phone]

## 2024-08-12 ENCOUNTER — Encounter: Payer: Self-pay | Admitting: Pediatrics

## 2024-08-13 LAB — HM MAMMOGRAPHY

## 2024-08-17 ENCOUNTER — Ambulatory Visit: Admitting: Gastroenterology

## 2024-08-17 NOTE — Progress Notes (Deleted)
 SABRA

## 2024-08-25 ENCOUNTER — Encounter: Payer: Self-pay | Admitting: Nurse Practitioner

## 2024-08-29 ENCOUNTER — Encounter: Payer: Self-pay | Admitting: Pediatrics

## 2024-08-31 ENCOUNTER — Other Ambulatory Visit: Payer: Self-pay | Admitting: Nurse Practitioner

## 2024-08-31 DIAGNOSIS — I700 Atherosclerosis of aorta: Secondary | ICD-10-CM

## 2024-08-31 DIAGNOSIS — E1165 Type 2 diabetes mellitus with hyperglycemia: Secondary | ICD-10-CM

## 2024-08-31 DIAGNOSIS — I10 Essential (primary) hypertension: Secondary | ICD-10-CM

## 2024-08-31 DIAGNOSIS — E782 Mixed hyperlipidemia: Secondary | ICD-10-CM

## 2024-09-12 ENCOUNTER — Ambulatory Visit: Admitting: Nurse Practitioner

## 2024-09-12 VITALS — BP 132/78 | HR 60 | Ht 61.0 in | Wt 174.4 lb

## 2024-09-12 DIAGNOSIS — I10 Essential (primary) hypertension: Secondary | ICD-10-CM

## 2024-09-12 DIAGNOSIS — E785 Hyperlipidemia, unspecified: Secondary | ICD-10-CM | POA: Diagnosis not present

## 2024-09-12 DIAGNOSIS — E1169 Type 2 diabetes mellitus with other specified complication: Secondary | ICD-10-CM

## 2024-09-12 DIAGNOSIS — R270 Ataxia, unspecified: Secondary | ICD-10-CM

## 2024-09-12 DIAGNOSIS — Z7984 Long term (current) use of oral hypoglycemic drugs: Secondary | ICD-10-CM

## 2024-09-12 DIAGNOSIS — L659 Nonscarring hair loss, unspecified: Secondary | ICD-10-CM | POA: Insufficient documentation

## 2024-09-12 DIAGNOSIS — E1165 Type 2 diabetes mellitus with hyperglycemia: Secondary | ICD-10-CM

## 2024-09-12 DIAGNOSIS — E559 Vitamin D deficiency, unspecified: Secondary | ICD-10-CM

## 2024-09-12 LAB — LIPID PANEL
Cholesterol: 124 mg/dL (ref 0–200)
HDL: 47.2 mg/dL (ref >39.00)
LDL Cholesterol: 54 mg/dL (ref 0–99)
NonHDL: 76.76
Total CHOL/HDL Ratio: 3
Triglycerides: 115 mg/dL (ref 0.0–149.0)
VLDL: 23 mg/dL (ref 0.0–40.0)

## 2024-09-12 LAB — RENAL FUNCTION PANEL
Albumin: 4.3 g/dL (ref 3.5–5.2)
BUN: 21 mg/dL (ref 6–23)
CO2: 30 meq/L (ref 19–32)
Calcium: 9.6 mg/dL (ref 8.4–10.5)
Chloride: 101 meq/L (ref 96–112)
Creatinine, Ser: 1 mg/dL (ref 0.40–1.20)
GFR: 56.15 mL/min — ABNORMAL LOW (ref >60.00)
Glucose, Bld: 90 mg/dL (ref 70–99)
Phosphorus: 3.4 mg/dL (ref 2.3–4.6)
Potassium: 4 meq/L (ref 3.5–5.1)
Sodium: 139 meq/L (ref 135–145)

## 2024-09-12 LAB — HEMOGLOBIN A1C: Hgb A1c MFr Bld: 6.4 % (ref 4.6–6.5)

## 2024-09-12 LAB — VITAMIN D 25 HYDROXY (VIT D DEFICIENCY, FRACTURES): VITD: 52.9 ng/mL (ref 30.00–100.00)

## 2024-09-12 MED ORDER — AMLODIPINE BESYLATE 5 MG PO TABS: 5.0000 mg | ORAL_TABLET | Freq: Every evening | ORAL | 3 refills | Status: AC

## 2024-09-12 MED ORDER — LOSARTAN POTASSIUM-HCTZ 100-25 MG PO TABS: 1.0000 | ORAL_TABLET | Freq: Every day | ORAL | 1 refills | Status: AC

## 2024-09-12 NOTE — Assessment & Plan Note (Signed)
 Controlled with metformin  250mg  daily Normal UACr No neuropathy No retinopathy LDL at goal with atorvastatin   Repeat hgbA1c, lipid panel and BMP Maintain med dose F/up in 6months

## 2024-09-12 NOTE — Assessment & Plan Note (Signed)
 Continuous improvement in gait with intermittent dizziness, intermittent use of cane, denies any fall Has f/up with neurology 10/2024

## 2024-09-12 NOTE — Assessment & Plan Note (Signed)
 Repeat vit D

## 2024-09-12 NOTE — Progress Notes (Signed)
 Established Patient Visit  Patient: Mary Collier   DOB: 01-25-1951   73 y.o. Female  MRN: [id number] Visit Date: 09/12/2024  Subjective:    Chief Complaint  Patient presents with   Follow-up    FASTING 4 month follow up for DM, HTN and Hyperlipidemia    HPI Diffuse loss of scalp hair Global hair loss since 2022, associate with dry and flaky scalp. Denies use of hair dye or relaxer Under the care of dermatology x 4months.  Vitamin D  deficiency Repeat vit. D  DM (diabetes mellitus) (HCC) Controlled with metformin  250mg  daily Normal UACr No neuropathy No retinopathy LDL at goal with atorvastatin   Repeat hgbA1c, lipid panel and BMP Maintain med dose F/up in 6months  Hyperlipidemia associated with type 2 diabetes mellitus (HCC) Maintain crestor  dose Repeat lipid panel  Hypertensive disorder BP at goal with amlodipine , losartan  and hydrochlorothiazide  BP Readings from Last 3 Encounters:  09/12/24 132/78  05/20/24 139/74  05/17/24 124/70    Combined losartan  and hydrochlorothiazide  to decrease pill burden. Repeat BMP F/up in 3months  Ataxia Continuous improvement in gait with intermittent dizziness, intermittent use of cane, denies any fall Has f/up with neurology 10/2024     08/27/2023   11:09 AM 03/24/2023    9:43 AM 08/25/2022    9:39 AM  Depression screen PHQ 2/9  Decreased Interest 0 0 0  Down, Depressed, Hopeless 0 0 0  PHQ - 2 Score 0 0 0    Reviewed medical, surgical, and social history today  Medications: Outpatient Medications Prior to Visit  Medication Sig   acetaminophen  (TYLENOL ) 325 MG tablet Take 325 mg by mouth as needed.   AMBULATORY NON FORMULARY MEDICATION Medication Name: Diltiazem 2% gel + Lidaocaine 5% Apply a pea sized amount internally four times daily.   amoxicillin  (AMOXIL ) 500 MG capsule Take 2 capsules (1,000 mg total) by mouth 2 (two) times daily.   calcium -vitamin D  (OSCAL 500/200 D-3) 500-200 MG-UNIT per tablet  Take 1 tablet by mouth 2 (two) times daily.   cetirizine (ZYRTEC) 10 MG tablet Take 10 mg by mouth daily.   ciclopirox (LOPROX) 0.77 % cream Apply 1 Application topically 2 (two) times daily.   Ciclopirox 1 % shampoo SMARTSIG:Sparingly Topical Daily   [employer] SENNA PLUS 8.6-50 MG tablet TAKE 1 TABLET BY MOUTH DAILY.   diclofenac  sodium (VOLTAREN ) 1 % GEL APPLY 2 G TOPICALLY 3 (THREE) TIMES DAILY AS NEEDED.   dimenhyDRINATE (DRAMAMINE) 50 MG tablet Take 50 mg by mouth every 8 (eight) hours as needed for dizziness.   fluticasone  (FLONASE ) 50 MCG/ACT nasal spray SPRAY 2 SPRAYS INTO EACH NOSTRIL EVERY DAY   folic acid  (FOLVITE ) 1 MG tablet Take 1 mg by mouth daily.   metFORMIN  (GLUCOPHAGE ) 500 MG tablet Take 0.5 tablets (250 mg total) by mouth daily with breakfast.   olopatadine (PATANOL) 0.1 % ophthalmic solution Place 1 drop into both eyes 2 (two) times daily.   omeprazole  (PRILOSEC) 20 MG capsule Take 1 capsule (20 mg total) by mouth 2 (two) times daily for 14 days.   omeprazole  (PRILOSEC) 40 MG capsule Take 1 capsule (40 mg total) by mouth daily.   Peppermint Oil (IBGARD) 90 MG CPCR Take as directed.   Phenylephrine HCl (PREPARATION H) 0.25 % SUPP Place rectally daily as needed.   RESTASIS 0.05 % ophthalmic emulsion 1 drop 2 (two) times daily.   rifabutin (MYCOBUTIN) 150 MG capsule Take  2 capsules (300 mg total) by mouth daily.   ROCKLATAN 0.02-0.005 % SOLN Apply 1 drop to eye at bedtime.   rosuvastatin  (CRESTOR ) 20 MG tablet TAKE 1 TABLET BY MOUTH EVERY DAY   TRYPTYR 0.003 % SOLN Apply 1 drop to eye 2 (two) times daily.   [DISCONTINUED] amLODipine  (NORVASC ) 5 MG tablet Take 1 tablet (5 mg total) by mouth every evening.   [DISCONTINUED] hydrochlorothiazide  (HYDRODIURIL ) 25 MG tablet Take 1 tablet (25 mg total) by mouth daily.   [DISCONTINUED] losartan  (COZAAR ) 100 MG tablet TAKE 1 TABLET BY MOUTH EVERY DAY   [DISCONTINUED] dorzolamide-timolol (COSOPT) 2-0.5 % ophthalmic solution Place 1 drop into  both eyes 2 (two) times daily. (Patient not taking: Reported on 09/12/2024)   [DISCONTINUED] ibuprofen  (ADVIL ) 800 MG tablet Take 1 tablet (800 mg total) by mouth every 8 (eight) hours as needed. (Patient not taking: Reported on 09/12/2024)   [DISCONTINUED] ipratropium (ATROVENT ) 0.03 % nasal spray  (Patient not taking: Reported on 09/12/2024)   [DISCONTINUED] nortriptyline  (PAMELOR ) 25 MG capsule TAKE 1 CAPSULE BY MOUTH EVERYDAY AT BEDTIME (Patient not taking: Reported on 09/12/2024)   [DISCONTINUED] triamcinolone  cream (KENALOG ) 0.1 % Apply topically 2 (two) times daily. (Patient not taking: Reported on 09/12/2024)   No facility-administered medications prior to visit.   Reviewed past medical and social history.   ROS per HPI above      Objective:  BP 132/78 (BP Location: Left Arm, Patient Position: Sitting, Cuff Size: Large)   Pulse 60   Ht 5' 1 (1.549 m)   Wt 174 lb 6.4 oz (79.1 kg)   SpO2 99%   BMI 32.95 kg/m      Physical Exam Vitals and nursing note reviewed.  Cardiovascular:     Rate and Rhythm: Normal rate and regular rhythm.     Pulses: Normal pulses.     Heart sounds: Normal heart sounds.  Pulmonary:     Effort: Pulmonary effort is normal.     Breath sounds: Normal breath sounds.  Musculoskeletal:     Right lower leg: No edema.     Left lower leg: No edema.  Neurological:     Mental Status: She is alert and oriented to person, place, and time.     Cranial Nerves: No cranial nerve deficit.     Motor: No weakness.     Gait: Gait normal.  Psychiatric:        Mood and Affect: Mood normal.        Behavior: Behavior normal.        Thought Content: Thought content normal.     No results found for any visits on 09/12/24.    Assessment & Plan:    Problem List Items Addressed This Visit     Ataxia   Continuous improvement in gait with intermittent dizziness, intermittent use of cane, denies any fall Has f/up with neurology 10/2024      DM (diabetes  mellitus) (HCC)   Controlled with metformin  250mg  daily Normal UACr No neuropathy No retinopathy LDL at goal with atorvastatin   Repeat hgbA1c, lipid panel and BMP Maintain med dose F/up in 6months      Relevant Medications   losartan -hydrochlorothiazide  (HYZAAR) 100-25 MG tablet   Other Relevant Orders   Hemoglobin A1c   Hyperlipidemia associated with type 2 diabetes mellitus (HCC)   Maintain crestor  dose Repeat lipid panel      Relevant Medications   losartan -hydrochlorothiazide  (HYZAAR) 100-25 MG tablet   amLODipine  (NORVASC ) 5 MG tablet  Other Relevant Orders   Renal Function Panel   Lipid panel   Hypertensive disorder - Primary   BP at goal with amlodipine , losartan  and hydrochlorothiazide  BP Readings from Last 3 Encounters:  09/12/24 132/78  05/20/24 139/74  05/17/24 124/70    Combined losartan  and hydrochlorothiazide  to decrease pill burden. Repeat BMP F/up in 3months      Relevant Medications   losartan -hydrochlorothiazide  (HYZAAR) 100-25 MG tablet   amLODipine  (NORVASC ) 5 MG tablet   Other Relevant Orders   Renal Function Panel   Vitamin D  deficiency   Repeat vit. D      Relevant Orders   VITAMIN D  25 Hydroxy (Vit-D Deficiency, Fractures)   Return in about 3 months (around 12/13/2024) for HTN, DM, hyperlipidemia (fasting).     Roselie Mood, NP

## 2024-09-12 NOTE — Assessment & Plan Note (Signed)
Maintain crestor dose Repeat lipid panel

## 2024-09-12 NOTE — Assessment & Plan Note (Addendum)
 BP at goal with amlodipine , losartan  and hydrochlorothiazide  BP Readings from Last 3 Encounters:  09/12/24 132/78  05/20/24 139/74  05/17/24 124/70    Combined losartan  and hydrochlorothiazide  to decrease pill burden. Repeat BMP F/up in 3months

## 2024-09-12 NOTE — Patient Instructions (Signed)
 Go to lab Maintain Heart healthy diet and daily exercise. Maintain current medications.

## 2024-09-12 NOTE — Assessment & Plan Note (Signed)
 Global hair loss since 2022, associate with dry and flaky scalp. Denies use of hair dye or relaxer Under the care of dermatology x 4months.

## 2024-09-14 ENCOUNTER — Ambulatory Visit: Payer: Self-pay | Admitting: Nurse Practitioner

## 2024-10-04 ENCOUNTER — Ambulatory Visit: Payer: Self-pay

## 2024-10-04 NOTE — Telephone Encounter (Signed)
 FYI Only or Action Required?: Action required by provider: request for appointment.  Patient was last seen in primary care on 09/12/2024 by Nche, Mary Rockford, NP.  Called Nurse Triage reporting Leg Swelling.  Symptoms began several weeks ago.  Interventions attempted: Prescription medications: Hyzaar and amlodipine .  Symptoms are: unchanged.  Triage Disposition: See Physician Within 24 Hours  Patient/caregiver understands and will follow disposition?: No, wishes to speak with PCP  Copied from CRM #[id number]. Topic: Clinical - Red Word Triage >> Oct 04, 2024  2:40 PM Suzen RAMAN wrote: Red Word that prompted transfer to Nurse Triage: swelling in ankle, hand swelling in the morning pt believes its due bp medication(losartan -hydrochlorothiazide  (HYZAAR) 100-25 MG tablet) and requesting to further discuss with provider Reason for Disposition  MODERATE hand swelling (e.g., visible swelling of hand and fingers; pitting edema)  Answer Assessment - Initial Assessment Questions Tightness and redness in hands in the morning. As she moves around swelling goes down but then swell up by night time. Swelling goes down with elevation. Says she started Hyzaar and amlodipine . Patient concerned her metformin  had caused her bout of imbalance. Says she had PT and that helped. Says sometimes she still gets a little stumbly and has to use her cane when she goes out.  1. LOCATION: Which ankle is swollen? Where is the swelling?     Localized ankle pain, right is a little worse  2. ONSET: When did the swelling start?     After last visit on 09/12/2024 3. SWELLING: How bad is the swelling? Or, How large is it? (e.g., mild, moderate, severe; size of localized swelling)      Moderate 4. PAIN: Is there any pain? If Yes, ask: How bad is it? (Scale 0-10; or none, mild, moderate, severe)     0 5. CAUSE: What do you think caused the ankle swelling?     unsure 6. OTHER SYMPTOMS: Do you have any  other symptoms? (e.g., fever, chest pain, difficulty breathing, calf pain)    Denies  Answer Assessment - Initial Assessment Questions Tightness and redness in hands in the morning. As she moves around swelling goes down but then swell up by night time. Swelling goes down with elevation. Says she started Hyzaar and amlodipine . Patient concerned her metformin  had caused her bout of imbalance. Says she had PT and that helped. Says sometimes she still gets a little stumbly and has to use her cane when she goes out. Patient says she's a little concerned about taking this metformin  now, even though she's been on it a while. Mary Mood does not have any appointments until 1/15. Patient does not want to see anyone else, only wants to see or speak to [facility]. Does not want available appointment on 12/17 with another provider.  Protocols used: Ankle Swelling-A-AH, Hand Swelling-A-AH

## 2024-10-04 NOTE — Telephone Encounter (Signed)
 Called patient to ask how she is feeling and how is the leg swelling. She stated that she is not taking the the new medication losartan -hydrochlorothiazide  to see if that is what is causing the swelling. 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 Informed patient that if the swelling becomes worse to please give the office a call so that she can be evaluated and I know that she prefers to see Roselie and that I do not want her to go that long with the swelling. Informed her that I will giving her a call back in a couple of days to check in on her. She thanked me for calling

## 2024-10-05 NOTE — Telephone Encounter (Signed)
 Patient called and asked about the Amlodipine . She stated that she takes that at night as directed and only is not taking the losartan -hydrochlorothiazide  to see if this is what is causing her swelling. I asked about the swelling and she stated that it's about the same has come down some. I reinformed her that if it continues to call the office to get scheduled for an appointment or to go to the urgent care or ED for evaluation if she develops worsen swelling, develops SOB and/or having chest pain and/or discomfort. Stated to not wait until Roselie is back in office. She verbalized understanding and all (if any) questions wee answered.

## 2024-11-03 NOTE — Progress Notes (Signed)
 "  Virtual Visit via Video Note:   Consent was obtained for video visit:  Yes.   Answered questions that patient had about telehealth interaction:  Yes.   I discussed the limitations, risks, security and privacy concerns of performing an evaluation and management service by telemedicine. I also discussed with the patient that there may be a patient responsible charge related to this service. The patient expressed understanding and agreed to proceed.  Pt location: Home Physician Location: office Name of referring provider:  Nche, Roselie Rockford, NP I connected with Merlynn DELENA Christians at patients initiation/request on 11/07/2024 at  2:30 PM EST by video enabled telemedicine application and verified that I am speaking with the correct person using two identifiers. 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  Her presentation exhibits findings seen in functional neurologic disorders.  She has difficulty with moving her upper extremities but is able to meet her target when reaching for something or with touching her nose.  She exhibits extreme undulating movements but maintains her stance.  Abrupt onset of symptoms is not consistent with a neurodegenerative disease.   As she endorses recurrence of tremor, will refer to the movement disorder clinic at [facility] Follow up in 6 months.  Total time spent in chart and face to face with patient: 23 minutes   Subjective:  FONNIE CROOKSHANKS is a 74 year old female hypertension and GERD and remote history of seizures (stopped in her 73s) whom I see for paroxysmal hemicrania and right sided occipital neuralgia who follows up for tremor.  She is accompanied by her sister and husband.   UPDATE: Functional movement disorder: EEG on 03/30/2024 was normal.  Referred to physical therapy.  It helped.  She was doing  well until a few days ago.  She now feels some buzziness and head moves.  This is the first time since her last appointment.  Sometimes when she tries to pick up something, her hands shake.  One time, she did stumble a bit when she walked.  But overall, however, she is able to ambulate.  Takes her cane around but doesn't need it.    Right occipital neuralgia: Taken off nortriptyline  by her PCP.  Headaches occur once or twice a week but only last 2-3 minutes.    HISTORY:  She has remote history of headaches and generalized tonic clonic seizures as a teenager, which subsequently resolved.   Headaches: Around 2010, she developed episodes of sharp right sided retro-orbital pain, 10/10, lasting 3 to 10 minutes.  There is associated conjunctival injection and watery eye but no ptosis or nasal congestion.  There is no associated symptoms such as visual disturbance (specific to these attacks), unilateral facial numbness/weakness, nausea, vomiting, dizziness or focal unilateral weakness or numbness of the body.  They initially occurred once a day but then became several times daily in 2018.  There are no triggers.  Laying down and applying warm compresses helps.  She takes Tylenol .  She was previously treated at [facility] and was diagnosed with ocular migraines.  She also has sensation of water in back of the right occipital region.  She received occipital nerve blocks years ago, which was effective.    There is no family history of headaches or cerebral aneurysms.  Past medications:  Gabapentin (dizziness), topiramate  (dizziness), verapamil  (dizziness)  Visual disturbance: Around 2015, she developed gradual onset of vision loss and visual disturbance.  People and objects look like shadows and she sees double or triple.  When she reads, she sees several copies of the words stacked on top of each other.  She also sees floaters.  Occasionally, she sees a small yellow dot in the vision of  her left eye that is intermittent, lasting just seconds.  She has been followed by [facility] for several years for glaucoma.  Due to persistent visual disturbance, she was evaluated by Dr. Norleen Ku, a retinal specialist, on 10/23/16. On Humphrey visual field testing, she exhibited severe constriction bilaterally.  Retinal exam revealed grade II hypertensive retinopathy with minimal vitreous opacities.  It was suggested that her visual field defect may be migraine-related phenomena.  I last saw her in 2018.  Sed rate and CRP from 01/01/2017 were 13 and 0.1 respectively.  MRI of brain with and without contrast on 01/21/2017 was personally reviewed and was unremarkable.  She saw neuro-ophthalmologist Dr. Evalene Lunger later in 2018, who was unable to make any definitive diagnosis.  She continues to have the persistent vision loss.  Labs from 10/12/2019 showed sed rate 31 and CRP <1.0.  She saw [facility] in June 2021.   Neuro-ophthalmic exam was normal but noted to have significant corneal dryness which could explain her decreased visual acuity.  An ultrasound of the temporal arteries was performed and was negative.  Sometimes she has a twitching in her right eye and has trouble opening it.  Not necessarily associated with headache.  Around May or June 2022, she started getting eye pain in the left eye now as well as the right eye, occurs separately, not at same time.  They last 10-15 minutes and occur 2-3 times a day.    Vertigo: She has episodic vertigo since she was a teenager.  Occurs intermittently and then resolved for a while.  Lasts 30 minutes.  Occurs spontaneously but also it also positional.  No headache, visual disturbance, ear pain.  Laying down in dark helps.  She has history of ringing in her right ear but now notes a stomping sound.  She saw ENT.  Audiogram was unremarkable.  Dix-Hallpike was negative, although it did reproduce subjective symptoms.  She has been referred to [facility] for full vestibular evaluation.    Functional movement disorder: On 02/25/2024, she was feeling well.  She was driving to her PCP for routine follow-up and when she got out of the car, she had difficulty getting out  When she was in the office, her head and arms were jerking.  Her PCP had her walk down the hall and was concerned about Parkinson's disease.  Since then, she continues to have jerking.  She denies hand tremors when eating or holding a cup but notes tremor when writing.  When she stands up, she feels off balance but no dizziness.  If she focuses, it will improve.  Sometimes she gets stuck and she cannot move.  When she walks, she sometimes will stop.  She hasn't had a sense of smell since her 12s.  No recent medication changes, head trauma or illness.  No family history of Parkinson's disease.  Over the last couple of years, she sometimes talks in her sleep and sometimes will move her hands in her sleep or start screaming or laughing in her sleep.  PCP discontinued her nortriptyline  and dicyclomine .  She hasn't noticed any improvement.  She has  been ordered PT.  Labs from 02/25/2024 revealed sed rate 42, negative CRP, non-reactive RPR, CK 139, Hgb A1c 6.7, B12 490, TSH 2.22, non-reactive HIV, normal CBC and unremarkable CMP except for low GFR 49.20.  UA on 03/09/2024 negative for UTI.  MRI of brain with and without contrast on 02/26/2024 revealed mild chronic small vessel ischemic changes but no acute intracranial abnormality.    Denied new medications, recent head injury, recent illness or new environmental exposures.  No family history of movement disorder.  She denied depression or significant anxiety.  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  However, she says she enjoys it.  Denies prior medical history of major depressive disorder or anxiety.  Past Medical History: Past Medical History:  Diagnosis Date   Allergic rhinitis     Allergy    Arthritis    Asthma    Blood transfusion without reported diagnosis    1966   Cancer (HCC)    at age 33.  abdominal   Cancer of kidney (HCC)    at age 4.  Right kidney.   Chronic headache    Dizziness 03/11/2022   Edema    GERD (gastroesophageal reflux disease)    Glaucoma    Hiatal hernia    Hypertension    IBS (irritable bowel syndrome)    Internal hemorrhoid    Lumbago    Lump or mass in breast    Lung cancer (HCC)    at age 42  Right lung.   Other abnormal blood chemistry    Other and unspecified hyperlipidemia    Other convulsions    hx seizures at age 22   Peptic stricture of esophagus    Reflux esophagitis    Tubular adenoma of colon 10/20/2013   Unspecified menopausal and postmenopausal disorder    Unspecified vitamin D  deficiency     Medications: Outpatient Encounter Medications as of 11/07/2024  Medication Sig Note   acetaminophen  (TYLENOL ) 325 MG tablet Take 325 mg by mouth as needed.    AMBULATORY NON FORMULARY MEDICATION Medication Name: Diltiazem 2% gel + Lidaocaine 5% Apply a pea sized amount internally four times daily.    amLODipine  (NORVASC ) 5 MG tablet Take 1 tablet (5 mg total) by mouth every evening.    amoxicillin  (AMOXIL ) 500 MG capsule Take 2 capsules (1,000 mg total) by mouth 2 (two) times daily.    calcium -vitamin D  (OSCAL 500/200 D-3) 500-200 MG-UNIT per tablet Take 1 tablet by mouth 2 (two) times daily.    cetirizine (ZYRTEC) 10 MG tablet Take 10 mg by mouth daily.    ciclopirox (LOPROX) 0.77 % cream Apply 1 Application topically 2 (two) times daily.    Ciclopirox 1 % shampoo SMARTSIG:Sparingly Topical Daily    [employer] SENNA PLUS 8.6-50 MG tablet TAKE 1 TABLET BY MOUTH DAILY. 02/19/2023: As needed   diclofenac  sodium (VOLTAREN ) 1 % GEL APPLY 2 G TOPICALLY 3 (THREE) TIMES DAILY AS NEEDED.    dimenhyDRINATE (DRAMAMINE) 50 MG tablet Take 50 mg by mouth every 8 (eight) hours as needed for dizziness.    fluticasone  (FLONASE ) 50 MCG/ACT nasal  spray SPRAY 2 SPRAYS INTO EACH NOSTRIL EVERY DAY 03/30/2024: As needed   folic acid  (FOLVITE ) 1 MG tablet Take 1 mg by mouth daily.    losartan -hydrochlorothiazide  (HYZAAR) 100-25 MG tablet Take 1 tablet by mouth daily.    metFORMIN  (GLUCOPHAGE ) 500 MG tablet Take 0.5 tablets (250 mg total)  by mouth daily with breakfast.    olopatadine (PATANOL) 0.1 % ophthalmic solution Place 1 drop into both eyes 2 (two) times daily.    omeprazole  (PRILOSEC) 20 MG capsule Take 1 capsule (20 mg total) by mouth 2 (two) times daily for 14 days.    omeprazole  (PRILOSEC) 40 MG capsule Take 1 capsule (40 mg total) by mouth daily.    Peppermint Oil (IBGARD) 90 MG CPCR Take as directed.    Phenylephrine HCl (PREPARATION H) 0.25 % SUPP Place rectally daily as needed.    RESTASIS 0.05 % ophthalmic emulsion 1 drop 2 (two) times daily.    rifabutin (MYCOBUTIN) 150 MG capsule Take 2 capsules (300 mg total) by mouth daily.    ROCKLATAN 0.02-0.005 % SOLN Apply 1 drop to eye at bedtime.    rosuvastatin  (CRESTOR ) 20 MG tablet TAKE 1 TABLET BY MOUTH EVERY DAY    TRYPTYR 0.003 % SOLN Apply 1 drop to eye 2 (two) times daily.    No facility-administered encounter medications on file as of 11/07/2024.    Allergies: Allergies[1]  Family History: Family History  Problem Relation Age of Onset   Allergies Sister        x3   Asthma Sister    Bone cancer Father    Lung cancer Father    Stomach cancer Father    Esophageal cancer Father    Breast cancer Mother    Prostate cancer Brother    Pancreatic cancer Brother        mets from prostate   Colon cancer Brother    Hypertension Brother    Prostate cancer Brother    Bone cancer Brother    Diabetes Brother    Heart disease Sister    Diabetes Sister    Hypertension Sister    Breast cancer Sister        mastectomy   Stroke Sister    Rectal cancer Neg Hx    Liver cancer Neg Hx     Observations/Objective:   No acute distress.  Alert and oriented.  Speech fluent and  not dysarthric.  Language intact.  Eyes orthophoric on primary gaze.  Face symmetric.   Follow up Instructions:      -I discussed the assessment and treatment plan with the patient. The patient was provided an opportunity to ask questions and all were answered. The patient agreed with the plan and demonstrated an understanding of the instructions.   The patient was advised to call back or seek an in-person evaluation if the symptoms worsen or if the condition fails to improve as anticipated.   Juliene Lamar Dunnings, DO   CC: Roselie Bishop Mood, NP          [1]  Allergies Allergen Reactions   Codeine Swelling   Compazine [Prochlorperazine] Other (See Comments)   Dicyclomine  Other (See Comments)    Parkinson like symptoms   Influenza Vaccines     D/t being allergic to eggs   Iodine Other (See Comments)    Has shellfish allergy, would prefer not to take   Nortriptyline  Other (See Comments)    Parkinson's like symptoms noted(Jerking, loss of balance)   Prochlorperazine Edisylate Other (See Comments)    paralyzed   Shellfish Allergy Swelling    Lip, throat, tongue swelling  Other Reaction(s): Respiratory Distress   Shrimp Extract Swelling and Other (See Comments)    Other reaction(s): Not available   Tree Extract Anaphylaxis   Other Itching    Newspaper Dye  Peanut-Containing Drug Products Itching    Itching  Other Reaction(s): Other (See Comments)  Itching  Other Reaction(s): Other (See Comments)   Robinul  [Glycopyrrolate ] Other (See Comments)    boils   Vioxx [Rofecoxib] Itching    States able to take IBU   Aspirin Rash    States able to take IBU   Celecoxib Other (See Comments)    Able to take IBU   Egg Protein-Containing Drug Products Rash   Epinephrine Rash   Erythromycin Rash   Gabapentin Other (See Comments)    Dizziness and somnolence   Lisinopril  Cough    cough   Prednisone Palpitations    Rapid heart rate   Sulfa Antibiotics Rash    Other  Reaction(s): Other (See Comments)   Tetracycline Rash   "

## 2024-11-07 ENCOUNTER — Encounter: Payer: Self-pay | Admitting: Neurology

## 2024-11-07 ENCOUNTER — Telehealth: Admitting: Neurology

## 2024-11-07 DIAGNOSIS — G259 Extrapyramidal and movement disorder, unspecified: Secondary | ICD-10-CM

## 2024-11-07 DIAGNOSIS — M5481 Occipital neuralgia: Secondary | ICD-10-CM

## 2024-11-07 NOTE — Patient Instructions (Signed)
 Refer to movement disorder clinic at [facility]. Follow up 6 months.

## 2024-11-25 ENCOUNTER — Encounter: Payer: Self-pay | Admitting: Podiatry

## 2024-11-25 ENCOUNTER — Ambulatory Visit: Admitting: Podiatry

## 2024-12-14 ENCOUNTER — Ambulatory Visit: Admitting: Nurse Practitioner

## 2025-02-24 ENCOUNTER — Ambulatory Visit: Admitting: Podiatry

## 2025-05-17 ENCOUNTER — Ambulatory Visit: Payer: Self-pay | Admitting: Neurology
